# Patient Record
Sex: Male | Born: 1965 | Race: White | Hispanic: No | Marital: Married | Smoking: Never smoker
Health system: Southern US, Community
[De-identification: ages and names within clinical notes are randomized; demographics above are authoritative.]

## PROBLEM LIST (undated history)

## (undated) DIAGNOSIS — F32A Depression, unspecified: Secondary | ICD-10-CM

## (undated) DIAGNOSIS — F329 Major depressive disorder, single episode, unspecified: Secondary | ICD-10-CM

## (undated) DIAGNOSIS — G43909 Migraine, unspecified, not intractable, without status migrainosus: Secondary | ICD-10-CM

## (undated) DIAGNOSIS — E785 Hyperlipidemia, unspecified: Secondary | ICD-10-CM

## (undated) DIAGNOSIS — I2510 Atherosclerotic heart disease of native coronary artery without angina pectoris: Secondary | ICD-10-CM

## (undated) DIAGNOSIS — E237 Disorder of pituitary gland, unspecified: Secondary | ICD-10-CM

## (undated) DIAGNOSIS — Z9229 Personal history of other drug therapy: Secondary | ICD-10-CM

## (undated) DIAGNOSIS — E559 Vitamin D deficiency, unspecified: Secondary | ICD-10-CM

## (undated) DIAGNOSIS — R000 Tachycardia, unspecified: Secondary | ICD-10-CM

## (undated) DIAGNOSIS — I459 Conduction disorder, unspecified: Secondary | ICD-10-CM

## (undated) DIAGNOSIS — K219 Gastro-esophageal reflux disease without esophagitis: Secondary | ICD-10-CM

## (undated) DIAGNOSIS — Z7901 Long term (current) use of anticoagulants: Secondary | ICD-10-CM

## (undated) DIAGNOSIS — I425 Other restrictive cardiomyopathy: Secondary | ICD-10-CM

## (undated) DIAGNOSIS — D6859 Other primary thrombophilia: Secondary | ICD-10-CM

## (undated) DIAGNOSIS — E119 Type 2 diabetes mellitus without complications: Secondary | ICD-10-CM

## (undated) DIAGNOSIS — H6120 Impacted cerumen, unspecified ear: Secondary | ICD-10-CM

## (undated) DIAGNOSIS — Z950 Presence of cardiac pacemaker: Secondary | ICD-10-CM

## (undated) DIAGNOSIS — J309 Allergic rhinitis, unspecified: Secondary | ICD-10-CM

## (undated) DIAGNOSIS — C8190 Hodgkin lymphoma, unspecified, unspecified site: Secondary | ICD-10-CM

## (undated) HISTORY — DX: Major depressive disorder, single episode, unspecified: F32.9

## (undated) HISTORY — DX: Gastro-esophageal reflux disease without esophagitis: K21.9

## (undated) HISTORY — DX: Type 2 diabetes mellitus without complications: E11.9

## (undated) HISTORY — DX: Disorder of pituitary gland, unspecified: E23.7

## (undated) HISTORY — PX: NASAL SEPTUM SURGERY: SHX37

## (undated) HISTORY — DX: Atherosclerotic heart disease of native coronary artery without angina pectoris: I25.10

## (undated) HISTORY — DX: Migraine, unspecified, not intractable, without status migrainosus: G43.909

## (undated) HISTORY — DX: Tachycardia, unspecified: R00.0

## (undated) HISTORY — PX: ANGIOPLASTY: SHX39

## (undated) HISTORY — DX: Vitamin D deficiency, unspecified: E55.9

## (undated) HISTORY — PX: OTHER SURGICAL HISTORY: SHX169

## (undated) HISTORY — PX: CARDIAC SURGERY: SHX584

## (undated) HISTORY — DX: Hodgkin lymphoma, unspecified, unspecified site: C81.90

## (undated) HISTORY — PX: CORONARY ARTERY BYPASS GRAFT: SHX141

## (undated) HISTORY — DX: Long term (current) use of anticoagulants: Z79.01

## (undated) HISTORY — DX: Hyperlipidemia, unspecified: E78.5

## (undated) HISTORY — DX: Other primary thrombophilia: D68.59

## (undated) HISTORY — DX: Allergic rhinitis, unspecified: J30.9

## (undated) HISTORY — DX: Depression, unspecified: F32.A

## (undated) HISTORY — DX: Personal history of other drug therapy: Z92.29

## (undated) HISTORY — DX: Impacted cerumen, unspecified ear: H61.20

---
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
## 2009-10-11 HISTORY — PX: THORACOTOMY: SHX5074

## 2014-08-08 ENCOUNTER — Ambulatory Visit: Payer: Self-pay | Admitting: Cardiovascular Disease

## 2014-08-20 ENCOUNTER — Encounter: Payer: Self-pay | Admitting: Cardiovascular Disease

## 2014-08-20 ENCOUNTER — Ambulatory Visit (INDEPENDENT_AMBULATORY_CARE_PROVIDER_SITE_OTHER): Payer: 59 | Admitting: Cardiovascular Disease

## 2014-08-20 VITALS — BP 106/70 | HR 89 | Ht 70.0 in | Wt 198.1 lb

## 2014-08-20 DIAGNOSIS — Z954 Presence of other heart-valve replacement: Secondary | ICD-10-CM

## 2014-08-20 DIAGNOSIS — Z952 Presence of prosthetic heart valve: Secondary | ICD-10-CM | POA: Insufficient documentation

## 2014-08-20 NOTE — Progress Notes (Signed)
Mark Choi Date of Birth  [birth year]       Mark Choi    [employer] [address], [address], [place], [place]  [address]   [place], [place]  [address] [place]   Fax  [phone]     Fax [phone]  Problem List: 1. Status post aortic valve placement - 2010  2. Diabetes mellitus 3. Hyperlipidemia 4. Coronary artery disease - 1998 5. Hodgkin's disease - s/p mantal radiation, age 48.   6. Hx of "endocarditis" in 2014 - vegetation was never found   History of Present Illness:  Mark Choi is a new arrival from [place],  Has hx of AVR.  Recent graduate from [employer]) Had Hodgkins disease with mantel radiation,   The radiation caused numerous other problems - CAD, AS,  S/p St. Jude mechanical AV.  Coumadin levels have been poorly controlled.   INR 2.2 yesterday  Wants to come to our coumadin clinic He and his wife are remodeling their new kitchen so his diet has been poor -  eating out most day.   His blood pressure typically runs on the low side. His heart rate typically is 80 or 90 or perhaps higher. He has tried higher doses of metoprolol in the past but this caused hypotension.  He's feeling overall fairly well. He's here to establish cardiology care and he be established in the Coumadin clinic. He's not having any specific cardiac complaints today.   Current Outpatient Prescriptions  Medication Sig Dispense Refill  . aspirin 81 MG tablet Take 81 mg by mouth daily.    Marland Kitchen buPROPion (WELLBUTRIN SR) 150 MG 12 hr tablet Take 150 mg by mouth daily.    Marland Kitchen losartan (COZAAR) 25 MG tablet Take 25 mg by mouth daily.    . metFORMIN (GLUCOPHAGE) 1000 MG tablet Take 1,000 mg by mouth 2 (two) times daily with a meal.    . metoprolol tartrate (LOPRESSOR) 25 MG tablet Take 25 mg by mouth daily.    Marland Kitchen omeprazole (PRILOSEC) 20 MG capsule Take 20 mg by mouth daily.    . rosuvastatin (CRESTOR) 40 MG tablet Take  40 mg by mouth daily.    Marland Kitchen warfarin (COUMADIN) 6 MG tablet Take 6 mg by mouth daily.     No current facility-administered medications for this visit.     No Known Allergies  Past Medical History  Diagnosis Date  . Coronary artery disease   . Hodgkin's disease     AGE 84 IN REMISSION  . Hyperlipidemia   . MDD (major depressive disorder)     STABLE  . GERD (gastroesophageal reflux disease)   . Diabetes mellitus without complication     Past Surgical History  Procedure Laterality Date  . Coronary artery bypass graft    . Heart stent      3 STENTS  . Angioplasty      5  . Artificial aortic valve      ST JUDES    History  Smoking status  . Never Smoker   Smokeless tobacco  . Not on file    History  Alcohol Use  . Yes    Comment: SOCAILLY    Family History  Problem Relation Age of Onset  . Breast cancer Mother   . Alcohol abuse Mother   . Diabetes type II Father   . Diverticulitis Father   . Alcoholism Brother   .  Depression Brother     Reviw of Systems:  Reviewed in the HPI.  All other systems are negative.  Physical Exam: Blood pressure 106/70, pulse 89, height 5\' 10"  (1.778 m), weight 198 lb 1.9 oz (89.867 kg). Wt Readings from Last 3 Encounters:  08/20/14 198 lb 1.9 oz (89.867 kg)    General: Well developed, well nourished, in no acute distress.  Head: Normocephalic, atraumatic, sclera non-icteric, mucus membranes are moist,   Neck: Supple. Carotids are 2 + without bruits. No JVD   Lungs: Clear   Heart: RR, normal S1, mechanical S2  Abdomen: Soft, non-tender, non-distended with normal bowel sounds.  Msk:  Strength and tone are normal   Extremities: No clubbing or cyanosis. No edema.  Distal pedal pulses are 2+ and equal   Neuro: CN II - XII intact.  Alert and oriented X 3.   Psych:  Normal   ECG: Sinus rhythm at 89, 1st degree AV block   Assessment / Plan:

## 2014-08-20 NOTE — Assessment & Plan Note (Addendum)
He had an aortogram for placement in 2010 and. He is instructed of this aortic valve was presumably due to his mantle radiation for Hodgkins  disease. While he was being seen at the [facility] clinic on his goal INR was between 2.5 in 3.5. He is on ASA.  My reference in "Up to Date" shows his INR goal should be  between 2.0 - 3.0.  He wants to continue having his Coumadin checked by his medical doctor for now. He may be interested in being followed in our Coumadin clinic here.  He will call if he wants [place] to follow his INR levels followed here

## 2014-08-20 NOTE — Patient Instructions (Signed)
Your physician recommends that you continue on your current medications as directed. Please refer to the Current Medication list given to you today.  Your physician wants you to follow-up in: 6 months with Dr. Nahser.  You will receive a reminder letter in the mail two months in advance. If you don't receive a letter, please call our office to schedule the follow-up appointment.  

## 2014-09-07 ENCOUNTER — Emergency Department (HOSPITAL_COMMUNITY): Payer: 59

## 2014-09-07 ENCOUNTER — Emergency Department (HOSPITAL_COMMUNITY)
Admission: EM | Admit: 2014-09-07 | Discharge: 2014-09-08 | Disposition: A | Discharge status: Home / Self Care | Payer: 59 | Attending: Emergency Medicine | Admitting: Emergency Medicine

## 2014-09-07 ENCOUNTER — Encounter (HOSPITAL_COMMUNITY): Payer: Self-pay | Admitting: *Deleted

## 2014-09-07 DIAGNOSIS — R51 Headache: Secondary | ICD-10-CM | POA: Diagnosis present

## 2014-09-07 DIAGNOSIS — R112 Nausea with vomiting, unspecified: Secondary | ICD-10-CM | POA: Insufficient documentation

## 2014-09-07 DIAGNOSIS — R791 Abnormal coagulation profile: Secondary | ICD-10-CM | POA: Diagnosis not present

## 2014-09-07 DIAGNOSIS — R5381 Other malaise: Secondary | ICD-10-CM | POA: Diagnosis not present

## 2014-09-07 DIAGNOSIS — I2510 Atherosclerotic heart disease of native coronary artery without angina pectoris: Secondary | ICD-10-CM | POA: Insufficient documentation

## 2014-09-07 DIAGNOSIS — E119 Type 2 diabetes mellitus without complications: Secondary | ICD-10-CM | POA: Insufficient documentation

## 2014-09-07 DIAGNOSIS — R519 Headache, unspecified: Secondary | ICD-10-CM

## 2014-09-07 LAB — COMPREHENSIVE METABOLIC PANEL
ALBUMIN: 4.3 g/dL (ref 3.5–5.2)
ALK PHOS: 54 U/L (ref 39–117)
ALT: 30 U/L (ref 0–53)
AST: 30 U/L (ref 0–37)
Anion gap: 17 — ABNORMAL HIGH (ref 5–15)
BILIRUBIN TOTAL: 0.6 mg/dL (ref 0.3–1.2)
BUN: 14 mg/dL (ref 6–23)
CHLORIDE: 102 meq/L (ref 96–112)
CO2: 20 meq/L (ref 19–32)
CREATININE: 0.85 mg/dL (ref 0.50–1.35)
Calcium: 9 mg/dL (ref 8.4–10.5)
GFR calc Af Amer: >90 mL/min (ref >90)
GFR calc non Af Amer: >90 mL/min (ref >90)
GLUCOSE: 150 mg/dL — AB (ref 70–99)
POTASSIUM: 4.1 meq/L (ref 3.7–5.3)
Sodium: 139 mEq/L (ref 137–147)
Total Protein: 6.8 g/dL (ref 6.0–8.3)

## 2014-09-07 LAB — DIFFERENTIAL
BASOS PCT: 0 % (ref 0–1)
Basophils Absolute: 0 10*3/uL (ref 0.0–0.1)
Eosinophils Absolute: 0.2 10*3/uL (ref 0.0–0.7)
Eosinophils Relative: 2 % (ref 0–5)
LYMPHS ABS: 1.7 10*3/uL (ref 0.7–4.0)
Lymphocytes Relative: 17 % (ref 12–46)
MONOS PCT: 5 % (ref 3–12)
Monocytes Absolute: 0.5 10*3/uL (ref 0.1–1.0)
NEUTROS ABS: 7.6 10*3/uL (ref 1.7–7.7)
Neutrophils Relative %: 76 % (ref 43–77)

## 2014-09-07 LAB — TROPONIN I: Troponin I: <0.3 ng/mL (ref <0.30)

## 2014-09-07 LAB — CBC
HEMATOCRIT: 40.3 % (ref 39.0–52.0)
Hemoglobin: 13.7 g/dL (ref 13.0–17.0)
MCH: 29 pg (ref 26.0–34.0)
MCHC: 34 g/dL (ref 30.0–36.0)
MCV: 85.4 fL (ref 78.0–100.0)
PLATELETS: 176 10*3/uL (ref 150–400)
RBC: 4.72 MIL/uL (ref 4.22–5.81)
RDW: 13.5 % (ref 11.5–15.5)
WBC: 10 10*3/uL (ref 4.0–10.5)

## 2014-09-07 LAB — PROTIME-INR
INR: 3.41 — ABNORMAL HIGH (ref 0.00–1.49)
PROTHROMBIN TIME: 34.7 s — AB (ref 11.6–15.2)

## 2014-09-07 LAB — APTT: aPTT: 40 seconds — ABNORMAL HIGH (ref 24–37)

## 2014-09-07 LAB — CBG MONITORING, ED: Glucose-Capillary: 130 mg/dL — ABNORMAL HIGH (ref 70–99)

## 2014-09-07 MED ORDER — ONDANSETRON HCL 4 MG/2ML IJ SOLN
4.0000 mg | Freq: Once | INTRAMUSCULAR | Status: AC
  Administered 2014-09-07: 4 mg via INTRAVENOUS
  Filled 2014-09-07: qty 2

## 2014-09-07 MED ORDER — HYDROMORPHONE HCL 1 MG/ML IJ SOLN
1.0000 mg | Freq: Once | INTRAMUSCULAR | Status: AC
  Administered 2014-09-07: 1 mg via INTRAVENOUS
  Filled 2014-09-07: qty 1

## 2014-09-07 MED ORDER — ONDANSETRON HCL 4 MG/2ML IJ SOLN: 4.0000 mg | Freq: Once | INTRAMUSCULAR | Status: DC

## 2014-09-07 MED ORDER — PROCHLORPERAZINE MALEATE 10 MG PO TABS
10.0000 mg | ORAL_TABLET | Freq: Two times a day (BID) | ORAL | Status: DC | PRN
Start: 2014-09-07 — End: 2017-01-28

## 2014-09-07 NOTE — ED Provider Notes (Signed)
CSN: [id number]     Arrival date & time 09/07/14  1932 History   First MD Initiated Contact with Patient 09/07/14 1958     Chief Complaint  Patient presents with  . Headache     (Consider location/radiation/quality/duration/timing/severity/associated sxs/prior Treatment) HPI Comments: Patient is a 48 year old male with history of Hodgkin's disease, hyperlipidemia, depression, GERD, diabetes, CAD, artifical aortic valve who presents to the ED for evaluation of headache. He reports that the headache began around noon, but was mild. He took a tylenol with relief of his symptoms. Suddenly at [facility] while he was at dinner he developed a severe headache with nausea and vomiting. He denies blurry vision, double vision, photophobia. No numbness or weakness in his extremities. Patient is on Coumadin for his artificial heart valve and recently had a supra therapeutic INR of 4.1.    The history is provided by the patient. No language interpreter was used.    Past Medical History  Diagnosis Date  . Diabetes mellitus without complication   . Coronary artery disease    Past Surgical History  Procedure Laterality Date  . Aortic heart valve    . Cardiac surgery    . Cardiac stents     No family history on file. History  Substance Use Topics  . Smoking status: Never Smoker   . Smokeless tobacco: Not on file  . Alcohol Use: Yes     Comment: occ    Review of Systems  Constitutional: Negative for fever and chills.  Eyes: Negative for photophobia and visual disturbance.  Respiratory: Negative for shortness of breath.   Cardiovascular: Negative for chest pain.  Gastrointestinal: Positive for nausea and vomiting. Negative for abdominal pain.  Neurological: Positive for headaches. Negative for weakness and numbness.  All other systems reviewed and are negative.     Allergies  Review of patient's allergies indicates no known allergies.  Home Medications   Prior to Admission medications    Not on File   BP 136/90 mmHg  Pulse 101  Temp(Src) 98.3 F (36.8 C) (Oral)  Resp 16  SpO2 97% Physical Exam  Constitutional: He is oriented to person, place, and time. He appears well-developed and well-nourished. No distress.  HENT:  Head: Normocephalic and atraumatic.  Right Ear: External ear normal.  Left Ear: External ear normal.  Nose: Nose normal.  Eyes: Conjunctivae and EOM are normal. Pupils are equal, round, and reactive to light.  Neck: Normal range of motion. No rigidity. No tracheal deviation present.  No nuchal rigidity   Cardiovascular: Regular rhythm, intact distal pulses and normal pulses.  Tachycardia present.   Pulses:      Radial pulses are 2+ on the right side, and 2+ on the left side.       Posterior tibial pulses are 2+ on the right side, and 2+ on the left side.  Clicking of aortic valve replacement on auscultation   Pulmonary/Chest: Effort normal and breath sounds normal. No stridor.  Abdominal: Soft. He exhibits no distension. There is no tenderness.  Musculoskeletal: Normal range of motion.  Neurological: He is alert and oriented to person, place, and time. He has normal strength. No sensory deficit. Coordination and gait normal. GCS eye subscore is 4. GCS verbal subscore is 5. GCS motor subscore is 6.  Finger nose finger normal, rapid alternating movements normal, heel knee shin normal. Grip strength 5/5 bilaterally No pronator drift No facial droop Strength 5/5 in all extremities  Skin: Skin is warm and dry.  He is not diaphoretic.  Psychiatric: He has a normal mood and affect. His behavior is normal.  Nursing note and vitals reviewed.   ED Course  Procedures (including critical care time) Labs Review Labs Reviewed  PROTIME-INR - Abnormal; Notable for the following:    Prothrombin Time 34.7 (*)    INR 3.41 (*)    All other components within normal limits  APTT - Abnormal; Notable for the following:    aPTT 40 (*)    All other components  within normal limits  COMPREHENSIVE METABOLIC PANEL - Abnormal; Notable for the following:    Glucose, Bld 150 (*)    Anion gap 17 (*)    All other components within normal limits  CBG MONITORING, ED - Abnormal; Notable for the following:    Glucose-Capillary 130 (*)    All other components within normal limits  CBC  DIFFERENTIAL  TROPONIN I    Imaging Review Dg Chest 2 View  09/07/2014   CLINICAL DATA:  Acute onset of headache and vomiting, with malaise. Initial encounter.  EXAM: CHEST  2 VIEW  COMPARISON:  None.  FINDINGS: The lungs are well-aerated. Linear atelectasis or scarring is noted at the right midlung zone, with mild right-sided pleural thickening. Overlying postoperative change is noted near the right lung apex. There is no evidence of significant the pleural effusion or pneumothorax.  The heart is normal in size; the patient is status post median sternotomy. No acute osseous abnormalities are seen.  IMPRESSION: Linear atelectasis or scarring at the right mid lung zone, with mild right-sided pleural thickening, likely chronic in nature. Lungs otherwise clear.   Electronically Signed   By: Garald Balding M.D.   On: 09/07/2014 23:43   Ct Head (brain) Wo Contrast  09/07/2014   CLINICAL DATA:  48 year old male with progressive frontal headache and developing dizziness. Patient is on blood thinners.  EXAM: CT HEAD WITHOUT CONTRAST  TECHNIQUE: Contiguous axial images were obtained from the base of the skull through the vertex without intravenous contrast.  COMPARISON:  None.  FINDINGS: Negative for acute intracranial hemorrhage, acute infarction, mass, mass effect, hydrocephalus or midline shift. Gray-white differentiation is preserved throughout. No acute soft tissue or calvarial abnormality. The globes and orbits are symmetric and unremarkable. Normal aeration of the mastoid air cells. Mucous retention cyst versus polyp in the inferior right maxillary sinus.  IMPRESSION: Negative head CT.   Incidental note made of a mucous retention cyst versus polyp in the inferior right maxillary sinus.   Electronically Signed   By: Jacqulynn Cadet M.D.   On: 09/07/2014 20:46     EKG Interpretation   Date/Time:  Saturday September 07 2014 23:01:25 EST Ventricular Rate:  92 PR Interval:  215 QRS Duration: 94 QT Interval:  369 QTC Calculation: 456 R Axis:   19 Text Interpretation:  Age not entered, assumed to be  48 years old for  purpose of ECG interpretation Sinus rhythm Prolonged PR interval Probable  left atrial enlargement RSR' in V1 or V2, right VCD or RVH Probable left  ventricular hypertrophy Inferior infarct, old No old tracing to compare  Confirmed by Debby Freiberg [phone]) on 09/07/2014 11:05:08 PM      MDM   Final diagnoses:  Malaise  Acute nonintractable headache, unspecified headache type  Supratherapeutic INR    Patient presents to ED for evaluation of headache. Mild headache early today, but with sudden worsening at [facility]. CT head done less than 6 hours after onset is negative for  acute pathology. Given sensitivity of CT within 6 hours, it is unlikely patient has acute bleed missed on CT. Patient with supra therapeutic INR and risks of LP outweigh benefits. Patient feels significantly improved in ED after IV dilaudid and zofran. Discussed strict return instructions with patient who voices understanding. Patient is reliable for follow up and vital signs are stable for discharge. Dr. Colin Rhein evaluated patient and agrees with plan. Patient / Family / Caregiver informed of clinical course, understand medical decision-making process, and agree with plan.   Elwyn Lade, PA-C 09/09/14 1728  Debby Freiberg, MD 09/14/14 [phone]

## 2014-09-07 NOTE — Discharge Instructions (Signed)
Please return to the emergency department if you have new or worsening symptoms.   General Headache Without Cause A headache is pain or discomfort felt around the head or neck area. The specific cause of a headache may not be found. There are many causes and types of headaches. A few common ones are:  Tension headaches.  Migraine headaches.  Cluster headaches.  Chronic daily headaches. HOME CARE INSTRUCTIONS   Keep all follow-up appointments with your caregiver or any specialist referral.  Only take over-the-counter or prescription medicines for pain or discomfort as directed by your caregiver.  Lie down in a dark, quiet room when you have a headache.  Keep a headache journal to find out what may trigger your migraine headaches. For example, write down:  What you eat and drink.  How much sleep you get.  Any change to your diet or medicines.  Try massage or other relaxation techniques.  Put ice packs or heat on the head and neck. Use these 3 to 4 times per day for 15 to 20 minutes each time, or as needed.  Limit stress.  Sit up straight, and do not tense your muscles.  Quit smoking if you smoke.  Limit alcohol use.  Decrease the amount of caffeine you drink, or stop drinking caffeine.  Eat and sleep on a regular schedule.  Get 7 to 9 hours of sleep, or as recommended by your caregiver.  Keep lights dim if bright lights bother you and make your headaches worse. SEEK MEDICAL CARE IF:   You have problems with the medicines you were prescribed.  Your medicines are not working.  You have a change from the usual headache.  You have nausea or vomiting. SEEK IMMEDIATE MEDICAL CARE IF:   Your headache becomes severe.  You have a fever.  You have a stiff neck.  You have loss of vision.  You have muscular weakness or loss of muscle control.  You start losing your balance or have trouble walking.  You feel faint or pass out.  You have severe symptoms that  are different from your first symptoms. MAKE SURE YOU:   Understand these instructions.  Will watch your condition.  Will get help right away if you are not doing well or get worse. Document Released: 09/27/2005 Document Revised: 12/20/2011 Document Reviewed: 10/13/2011 [facility] Patient Information 2015 [place], [place]. This information is not intended to replace advice given to you by your health care provider. Make sure you discuss any questions you have with your health care provider.  Warfarin Coagulopathy Warfarin (Coumadin) coagulopathy refers to bleeding that may occur as a complication of the medicine warfarin. Warfarin is an oral blood thinner (anticoagulant). Warfarin is used for medical conditions where thinning of the blood is needed to prevent blood clots.  CAUSES Bleeding is the most common and most serious complication of warfarin. The amount of bleeding is related to the warfarin dose and length of treatment. In addition, bleeding complications can also occur due to:  Intentional or accidental warfarin overdose.  Underlying medical conditions.  Dietary changes.  Medicine, herbal, supplement, or alcohol interactions. SYMPTOMS Severe bleeding while on warfarin may occur from any tissue or organ. Symptoms of the blood being too thin may include:  Bleeding from the nose or gums.  Blood in bowel movements which may appear as bright red, dark, or black tarry stools.  Blood in the urine which may appear as pink, red, or brown urine.  Unusual bruising or bruising easily.  A  cut that does not stop bleeding within 10 minutes.  Vomiting blood or continuous nausea for more than 1 day.  Coughing up blood.  Broken blood vessels in your eye (subconjunctival hemorrhage).  Abdominal or back pain with or without flank bruising.  Sudden, severe headache.  Sudden weakness or numbness of the face, arm, or leg, especially on one side of the body.  Sudden  confusion.  Trouble speaking (aphasia) or understanding.  Sudden trouble seeing in one or both eyes.  Sudden trouble walking.  Dizziness.  Loss of balance or coordination.  Vaginal bleeding.  Swelling or pain at an injection site.  Superficial fat tissue death (necrosis) which may cause skin scarring. This is more common in women and may first present as pain in the waist, thighs, or buttocks.  Fever. HOME CARE INSTRUCTIONS  Always contact your health care provider of any concerns or signs of possible warfarin coagulopathy as soon as possible.  Take warfarin exactly as directed by your health care provider. It is recommended that you take your warfarin dose at the same time of the day. If you have been told to stop taking warfarin, do not resume taking warfarin until directed to do so by your health care provider. Follow your health care provider's instructions if you accidentally take an extra dose or miss a dose of warfarin. It is very important to take warfarin as directed since bleeding or blood clots could result in chronic or permanent injury, pain, or disability.  Keep all follow-up appointments with your health care provider as directed. It is very important to keep your appointments. Not keeping appointments could result in a chronic or permanent injury, pain, or disability because warfarin is a medicine that requires close monitoring.  While taking warfarin, you will need to have regular blood tests to measure your blood clotting time. These blood tests usually include both the prothrombin time (PT) and International Normalized Ratio (INR) tests. The PT and INR results allow your health care provider to adjust your dose of warfarin. The dose can change for many reasons. It is critically important that you have your PT and INR levels drawn exactly as directed. Your warfarin dose may stay the same or change depending on what the PT and INR results are. Be sure to follow up with  your health care provider regarding your PT and INR test results and what your warfarin dosage should be.  Many medicines can interfere with warfarin and affect the PT and INR results. You must tell your health care provider about any and all medicines you take. This includes all vitamins and supplements. Ask your health care provider before taking these. Prescription and over-the-counter medicine consistency is critical to warfarin management. It is important that potential interactions are checked before you start a new medicine. Be especially cautious with aspirin and anti-inflammatory medicines. Ask your health care provider before taking these. Medicines such as antibiotics and acid-reducing medicine can interact with warfarin and can cause an increased warfarin effect. Warfarin can also interfere with the effectiveness of medicines you are taking. Do not take or discontinue any prescribed or over-the-counter medicine except on the advice of your health care provider or pharmacist.  Some vitamins, supplements, and herbal products interfere with the effectiveness of warfarin. Vitamin E may increase the anticoagulant effects of warfarin. Vitamin K can cause warfarin to be less effective. Do not take or discontinue any vitamin, supplement, or herbal product except on the advice of your health care provider or pharmacist.  Eat what you normally eat and keep the vitamin K content of your diet consistent. Avoid major changes in your diet, or notify your health care provider before changing your diet. Suddenly getting a lot more vitamin K could cause your blood to clot too quickly. A sudden decrease in vitamin K intake could cause your blood to clot too slowly. These changes in vitamin K intake could lead to dangerous blood clotsor to bleeding. To keep your vitamin K intake consistent, you must be aware of which foods contain moderate or high amounts of vitamin K. Some foods high in vitamin K include spinach,  kale, broccoli, cabbage, greens, Brussels sprouts, asparagus, bok choy, coleslaw, parsley, and green tea. Arrange a visit with a dietitian to answer your questions.  If you have a loss of appetite or get the stomach flu (viral gastroenteritis), talk to your health care provider as soon as possible. A decrease in your normal vitamin K intake can make you more sensitive to your usual dose of warfarin.  Some medical conditions may increase your risk for bleeding while you are taking warfarin. A fever, diarrhea lasting more than a day, worsening heart failure, or worsening liver function are some medical conditions that could affect warfarin. Contact your health care provider if you have any of these medical conditions.  Be careful not to cut yourself when using sharp objects or while shaving.  Alcohol can change the body's ability to handle warfarin. It is best to avoid alcoholic drinks or consume only very small amounts while taking warfarin. Notify your health care provider if you change your alcohol intake. A sudden increase in alcohol use can increase your risk of bleeding. Chronic alcohol use can cause warfarin to be less effective.  Limit physical activities or sports that could result in a fall or cause injury.  Do not use warfarin if you are pregnant.  Inform all your health care providers and your dentist that you take warfarin.  Inform all health care providers if you are taking warfarin and aspirin or platelet inhibitor medicines such as clopidogrel, ticagrelor, or prasugrel. Use of these medicines in addition to warfarin can increase your risk of bleeding or death. Taking these medicines together should only be done under the direct care of your health care providers. SEEK IMMEDIATE MEDICAL CARE IF:  You cough up blood.  You have dark or black stools or there is bright red blood coming from your rectum.  You vomit blood or have nausea for more than 1 day.  You have blood in the  urine or pink-colored urine.  You have unusual bruising or have increased bruising.  You have bleeding from the nose or gums that does not stop quickly.  You have a cut that does not stop bleeding within 2-3 minutes.  You have sudden weakness or numbness of the face, arm, or leg, especially on one side of the body.  You have sudden confusion.  You have trouble speaking (aphasia) or understanding.  You have sudden trouble seeing in one or both eyes.  You have sudden trouble walking.  You have dizziness.  You have a loss of balance or coordination.  You have a sudden, severe headache.  You have a serious fall or head injury, even if you are not bleeding.  You have swelling or pain at an injection site.  You have unexplained tenderness or pain in the abdomen, back, waist, thighs, or buttocks.  You have a fever. Any of these symptoms may represent a  serious problem that is an emergency. Do not wait to see if the symptoms will go away. Get medical help right away. Call your local emergency services (911 in [place] Do not drive yourself to the hospital. Document Released: 09/05/2006 Document Revised: 02/11/2014 Document Reviewed: 03/07/2012 [facility] Patient Information 2015 [place], [place]. This information is not intended to replace advice given to you by your health care provider. Make sure you discuss any questions you have with your health care provider.

## 2014-09-07 NOTE — ED Notes (Signed)
Pt states started getting headache earlier today.  Pt states now it is all over and feels bad.  Ate and started vomiting.  Pt is on blood thinner for artificial heart valve and concerned for brain bleed.

## 2014-09-09 ENCOUNTER — Encounter: Payer: Self-pay | Admitting: Cardiovascular Disease

## 2014-11-15 ENCOUNTER — Encounter: Payer: Self-pay | Admitting: Cardiovascular Disease

## 2014-11-15 ENCOUNTER — Ambulatory Visit (INDEPENDENT_AMBULATORY_CARE_PROVIDER_SITE_OTHER): Payer: 59 | Admitting: Cardiovascular Disease

## 2014-11-15 VITALS — BP 122/76 | HR 97 | Ht 70.0 in | Wt 196.8 lb

## 2014-11-15 DIAGNOSIS — Z954 Presence of other heart-valve replacement: Secondary | ICD-10-CM

## 2014-11-15 NOTE — Progress Notes (Signed)
Cardiology Office Note   Date:  11/15/2014   ID:  Mark Choi, DOB 1966-06-26, MRN [id number]  PCP:  No primary care provider on file.  Cardiologist:   Nahser, Wonda Cheng, MD   Chief Complaint  Patient presents with  . Follow-up    aortic valve replacement   Problem List: 1. Status post aortic valve placement - 2010  2. Diabetes mellitus 3. Hyperlipidemia 4. Coronary artery disease -  CABG in 1998 , multiple stents following CABG  5. Hodgkin's disease - s/p mantal radiation, age 76.  6. 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 male who presents for follow up for his AVR.   Since I saw him several months ago, he has developed symptoms  He has had dyspnea.  With any sort of exertion.  Associated with lightheadedness.  Resolves after several months.  Has occasional dyspnea even with sitting   Not related to a URI that he had a week ago. Also has some tingling in his arms with exertion. Rare cp Has been going on for 1 1/2 months.  Progressive worsening.  These symptoms feel similar to his symptoms that he had prior to his CABG.      Past Medical History  Diagnosis Date  . Hodgkin's disease     AGE 70 IN REMISSION  . Hyperlipidemia   . MDD (major depressive disorder)     STABLE  . GERD (gastroesophageal reflux disease)   . Diabetes mellitus without complication   . Coronary artery disease     Past Surgical History  Procedure Laterality Date  . Coronary artery bypass graft    . Heart stent      3 STENTS  . Angioplasty      5  . Artificial aortic valve      [facility]  . Aortic heart valve    . Cardiac surgery    . Cardiac stents       Current Outpatient Prescriptions  Medication Sig Dispense Refill  . aspirin 81 MG tablet Take 81 mg by mouth daily.    Marland Kitchen buPROPion (WELLBUTRIN SR) 150 MG 12 hr tablet Take 150 mg by mouth daily.    Marland Kitchen losartan (COZAAR) 25 MG tablet Take 25 mg by mouth  daily.    . metFORMIN (GLUCOPHAGE) 1000 MG tablet Take 1,000 mg by mouth 2 (two) times daily with a meal.    . metoprolol tartrate (LOPRESSOR) 25 MG tablet Take 25 mg by mouth every evening.    Marland Kitchen omeprazole (PRILOSEC) 20 MG capsule Take 20 mg by mouth daily.    . prochlorperazine (COMPAZINE) 10 MG tablet Take 1 tablet (10 mg total) by mouth 2 (two) times daily as needed for nausea or vomiting (Nausea ). 10 tablet 0  . ranitidine (ZANTAC) 150 MG capsule Take 150 mg by mouth daily as needed for heartburn.    . rosuvastatin (CRESTOR) 40 MG tablet Take 40 mg by mouth daily.    . Testosterone 12.5 MG/ACT (1%) GEL Apply 12.5 mg topically daily.  1  . warfarin (COUMADIN) 4 MG tablet Take 4 mg by mouth daily. Monday Wednesday Friday patient takes 4 mg     No current facility-administered medications for this visit.    Allergies:   Review of patient's allergies indicates no known allergies.    Social History:  The patient  reports that he has never smoked. He does not have  any smokeless tobacco history on file. He reports that he drinks alcohol. He reports that he does not use illicit drugs.   Family History:  The patient's family history includes Alcohol abuse in his mother; Alcoholism in his brother; Breast cancer in his mother; Depression in his brother; Diabetes type II in his father; Diverticulitis in his father.    ROS:  Please see the history of present illness.    Review of Systems: Constitutional:  denies fever, chills, diaphoresis, appetite change and fatigue.  HEENT: denies photophobia, eye pain, redness, hearing loss, ear pain, congestion, sore throat, rhinorrhea, sneezing, neck pain, neck stiffness and tinnitus.  Respiratory: admits to SOB, DOE,  Cardiovascular: denies chest pain, palpitations and leg swelling.  Gastrointestinal: denies nausea, vomiting, abdominal pain, diarrhea, constipation, blood in stool.  Genitourinary: denies dysuria, urgency, frequency, hematuria, flank pain  and difficulty urinating.  Musculoskeletal: denies  myalgias, back pain, joint swelling, arthralgias and gait problem.   Skin: denies pallor, rash and wound.  Neurological: denies dizziness, seizures, syncope, weakness, light-headedness, numbness and headaches.   Hematological: denies adenopathy, easy bruising, personal or family bleeding history.  Psychiatric/ Behavioral: denies suicidal ideation, mood changes, confusion, nervousness, sleep disturbance and agitation.       All other systems are reviewed and negative.    PHYSICAL EXAM: VS:  BP 122/76 mmHg  Pulse 97  Ht 5\' 10"  (1.778 m)  Wt 196 lb 12.8 oz (89.268 kg)  BMI 28.24 kg/m2 , BMI Body mass index is 28.24 kg/(m^2). GEN: Well nourished, well developed, in no acute distress HEENT: normal Neck: no JVD, carotid bruits, or masses Cardiac: RRR; no murmurs, rubs, or gallops,no edema  Respiratory:  clear to auscultation bilaterally, normal work of breathing GI: soft, nontender, nondistended, + BS MS: no deformity or atrophy Skin: warm and dry, no rash Neuro:  Strength and sensation are intact Psych: normal   EKG:  EKG is ordered today. The ekg ordered today demonstrates NSR at 97,  1st degree AV block . Left posterior fascicular block    Recent Labs: 09/07/2014: ALT 30; BUN 14; Creatinine 0.85; Hemoglobin 13.7; Platelets 176; Potassium 4.1; Sodium 139    Lipid Panel No results found for: CHOL, TRIG, HDL, CHOLHDL, VLDL, LDLCALC, LDLDIRECT    Wt Readings from Last 3 Encounters:  11/15/14 196 lb 12.8 oz (89.268 kg)  08/20/14 198 lb 1.9 oz (89.867 kg)      Other studies Reviewed: Additional studies/ records that were reviewed today include: . Review of the above records demonstrates:    ASSESSMENT AND PLAN:  Problem List: 1. Status post aortic valve placement - 2010  - we'll get an echocardiogram to get up good idea of what his current function and valvular function is. 2. Diabetes mellitus - followed by his  general medical doctor 3. Hyperlipidemia 4. Coronary artery disease -  CABG 1998, [place] has had multiple stent procedures following his bypass surgery. The symptoms are very similar to his previous episodes of angina. We'll get a [employer] study. I will have her very low threshold to perform a cardiac catheterization if his symptoms continue. 5. Hodgkin's disease - s/p mantal radiation, age 81.  6. 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  The patient does not have concerns regarding medicines.  The following changes have been made:  no change   Disposition:   FU with me in 1 months  Signed, Nahser, Wonda Cheng, MD  11/15/2014 10:05 AM    [facility] [address], [place], [place]  [address] Phone: [phone]; Fax: [phone]

## 2014-11-15 NOTE — Patient Instructions (Signed)
Your physician recommends that you continue on your current medications as directed. Please refer to the Current Medication list given to you today.  Your physician has requested that you have an echocardiogram. Echocardiography is a painless test that uses sound waves to create images of your heart. It provides your doctor with information about the size and shape of your heart and how well your heart's chambers and valves are working. This procedure takes approximately one hour. There are no restrictions for this procedure.  Your physician has requested that you have a lexiscan myoview. For further information please visit [email or web address]. Please follow instruction sheet, as given.  Your physician recommends that you schedule a follow-up appointment in: 1 month with Dr. Acie Fredrickson.

## 2014-11-26 ENCOUNTER — Ambulatory Visit (HOSPITAL_COMMUNITY): Payer: 59 | Attending: Cardiology

## 2014-11-26 ENCOUNTER — Ambulatory Visit (HOSPITAL_COMMUNITY): Payer: 59

## 2014-11-26 ENCOUNTER — Other Ambulatory Visit (HOSPITAL_COMMUNITY): Payer: 59 | Admitting: Cardiology

## 2014-11-26 DIAGNOSIS — E119 Type 2 diabetes mellitus without complications: Secondary | ICD-10-CM | POA: Insufficient documentation

## 2014-11-26 DIAGNOSIS — Z952 Presence of prosthetic heart valve: Secondary | ICD-10-CM

## 2014-11-26 DIAGNOSIS — Z954 Presence of other heart-valve replacement: Secondary | ICD-10-CM | POA: Diagnosis not present

## 2014-11-26 DIAGNOSIS — E785 Hyperlipidemia, unspecified: Secondary | ICD-10-CM | POA: Insufficient documentation

## 2014-11-26 MED ORDER — PERFLUTREN LIPID MICROSPHERE
1.0000 mL | Freq: Once | INTRAVENOUS | Status: AC
  Administered 2014-11-26: 1 mL via INTRAVENOUS

## 2014-11-26 NOTE — Progress Notes (Signed)
Cardiology Office Note   Date:  11/27/2014   ID:  Mark Choi, DOB Sep 28, 1966, MRN [id number]  PCP:  Rachell Cipro, MD  Cardiologist:   Thayer Headings, MD   Chief Complaint  Patient presents with  . Follow-up    endocarditis   Problem List: 1. Status post aortic valve placement - 2010  2. Diabetes mellitus 3. Hyperlipidemia 4. Coronary artery disease -  CABG in 1998 , multiple stents following CABG  5. Hodgkin's disease - s/p mantal radiation, age 49.  6. 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 male who presents for follow up for his AVR.   Since I saw him several months ago, he has developed symptoms  He has had dyspnea.  With any sort of exertion.  Associated with lightheadedness.  Resolves after several months.  Has occasional dyspnea even with sitting   Not related to a URI that he had a week ago. Also has some tingling in his arms with exertion. Rare cp Has been going on for 1 1/2 months.  Progressive worsening.  These symptoms feel similar to his symptoms that he had prior to his CABG.    Feb. 17, 2016:  Mark Choi had his echo yesterday and was  Found to have a vegetation on the aortic valve.  The vegetation is a filamentous strand like growth.  He has known endocarditis in 2014 and was treated at another hospital.  He has not had any symptoms of endocarditis.    Mark Choi is doing very well.  Not sick in any way. No fevers, rash,  No blood in urine  Has had migraine headaches.  Doubt that's related.    Past Medical History  Diagnosis Date  . Hodgkin's disease     AGE 59 IN REMISSION  . Hyperlipidemia   . MDD (major depressive disorder)     STABLE  . GERD (gastroesophageal reflux disease)   . Diabetes mellitus without complication   . Coronary artery disease     Past Surgical History  Procedure Laterality Date  . Coronary artery bypass graft    . Heart stent      3 STENTS    . Angioplasty      5  . Artificial aortic valve      [facility]  . Aortic heart valve    . Cardiac surgery    . Cardiac stents       Current Outpatient Prescriptions  Medication Sig Dispense Refill  . aspirin 81 MG tablet Take 81 mg by mouth daily.    Marland Kitchen buPROPion (WELLBUTRIN SR) 150 MG 12 hr tablet Take 150 mg by mouth daily.    Marland Kitchen losartan (COZAAR) 25 MG tablet Take 25 mg by mouth daily.    . metFORMIN (GLUCOPHAGE) 1000 MG tablet Take 1,000 mg by mouth 2 (two) times daily with a meal.    . metoprolol tartrate (LOPRESSOR) 25 MG tablet Take 25 mg by mouth every evening.    Marland Kitchen omeprazole (PRILOSEC) 20 MG capsule Take 20 mg by mouth daily.    . prochlorperazine (COMPAZINE) 10 MG tablet Take 1 tablet (10 mg total) by mouth 2 (two) times daily as needed for nausea or vomiting (Nausea ). 10 tablet 0  . ranitidine (ZANTAC) 150 MG capsule Take 150 mg by mouth daily as needed for heartburn.    . rosuvastatin (CRESTOR) 40 MG tablet Take 40 mg by mouth daily.    Marland Kitchen  Testosterone 12.5 MG/ACT (1%) GEL Apply 12.5 mg topically daily.  1  . warfarin (COUMADIN) 4 MG tablet Take 4 mg by mouth daily. Monday Wednesday Friday patient takes 4 mg     No current facility-administered medications for this visit.    Allergies:   Review of patient's allergies indicates no known allergies.    Social History:  The patient  reports that he has never smoked. He does not have any smokeless tobacco history on file. He reports that he drinks alcohol. He reports that he does not use illicit drugs.   Family History:  The patient's family history includes Alcohol abuse in his mother; Alcoholism in his brother; Breast cancer in his mother; Depression in his brother; Diabetes type II in his father; Diverticulitis in his father.    ROS:  Please see the history of present illness.    Review of Systems: Constitutional:  denies fever, chills, diaphoresis, appetite change and fatigue.  HEENT: denies photophobia, eye pain,  redness, hearing loss, ear pain, congestion, sore throat, rhinorrhea, sneezing, neck pain, neck stiffness and tinnitus.  Respiratory: admits to SOB, DOE,  Cardiovascular: denies chest pain, palpitations and leg swelling.  Gastrointestinal: denies nausea, vomiting, abdominal pain, diarrhea, constipation, blood in stool.  Genitourinary: denies dysuria, urgency, frequency, hematuria, flank pain and difficulty urinating.  Musculoskeletal: denies  myalgias, back pain, joint swelling, arthralgias and gait problem.   Skin: denies pallor, rash and wound.  Neurological: denies dizziness, seizures, syncope, weakness, light-headedness, numbness and headaches.   Hematological: denies adenopathy, easy bruising, personal or family bleeding history.  Psychiatric/ Behavioral: denies suicidal ideation, mood changes, confusion, nervousness, sleep disturbance and agitation.       All other systems are reviewed and negative.    PHYSICAL EXAM: VS:  BP 102/60 mmHg  Pulse 89  Ht 5\' 11"  (1.803 m)  Wt 198 lb (89.812 kg)  BMI 27.63 kg/m2 , BMI Body mass index is 27.63 kg/(m^2). GEN: Well nourished, well developed, in no acute distress HEENT: normal Neck: no JVD, carotid bruits, or masses Cardiac: RRR; no murmurs, rubs, or gallops,no edema  Respiratory:  clear to auscultation bilaterally, normal work of breathing GI: soft, nontender, nondistended, + BS MS: no deformity or atrophy Skin: warm and dry, no rash Neuro:  Strength and sensation are intact Psych: normal   EKG:  EKG is ordered today. The ekg ordered today demonstrates NSR at 97,  1st degree AV block . Left posterior fascicular block    Recent Labs: 09/07/2014: ALT 30; BUN 14; Creatinine 0.85; Hemoglobin 13.7; Platelets 176; Potassium 4.1; Sodium 139    Lipid Panel No results found for: CHOL, TRIG, HDL, CHOLHDL, VLDL, LDLCALC, LDLDIRECT    Wt Readings from Last 3 Encounters:  11/27/14 198 lb (89.812 kg)  11/15/14 196 lb 12.8 oz (89.268  kg)  08/20/14 198 lb 1.9 oz (89.867 kg)      Other studies Reviewed: Additional studies/ records that were reviewed today include: . Review of the above records demonstrates:    ASSESSMENT AND PLAN:  Problem List: 1. Status post aortic valve placement - 2010  - his echocardiogram from yesterday shows a filamentous structure attached to the aortic valve. I suspect that this is the vegetation from his episode of endocarditis in 2014.  He's not having any symptoms of endocarditis. We'll get some labs to make sure that this is a sterile structure. 2. Diabetes mellitus - followed by his general medical doctor 3. Hyperlipidemia 4. Coronary artery disease -  CABG 1998,  Mark Choi has had multiple stent procedures following his bypass surgery. The symptoms are very similar to his previous episodes of angina. We'll get a [employer] study. I will have her very low threshold to perform a cardiac catheterization if his symptoms continue. 5. Hodgkin's disease - s/p mantal radiation, age 42.  6. Hx of endocarditis in 2014 -  his echo card gram yesterday showed a filamentous structure attached to the aortic valve. He's not having any symptoms of endocarditis and I suspect that this is sterile. This is probably the vegetation leftover from his episode of endocarditis in 2014. He  Will draw labs today including blood cultures 2, CBC, sedimentation rate, basic medical profile   Current medicines are reviewed at length with the patient today.  The patient does not have concerns regarding medicines.  The following changes have been made:  no change   Disposition:   FU with me in 1 month     Signed, Nehemias Sauceda, Wonda Cheng, MD  11/27/2014 10:36 AM    [facility] [place], [place], [place]  [address] Phone: [phone]; Fax: [phone]

## 2014-11-26 NOTE — Progress Notes (Signed)
2D Echo with Definity completed. An echogenic substance noted in the echo.  Spoke with Dr. Radford Pax and Dr. Acie Fredrickson. Dr. Acie Fredrickson will see him tomorrow (11/27/2014). Stress test canceled by Dr. Radford Pax.  11/26/2014

## 2014-11-27 ENCOUNTER — Encounter: Payer: Self-pay | Admitting: Cardiovascular Disease

## 2014-11-27 ENCOUNTER — Ambulatory Visit (INDEPENDENT_AMBULATORY_CARE_PROVIDER_SITE_OTHER): Payer: 59 | Admitting: Cardiovascular Disease

## 2014-11-27 VITALS — BP 102/60 | HR 89 | Ht 71.0 in | Wt 198.0 lb

## 2014-11-27 DIAGNOSIS — Z954 Presence of other heart-valve replacement: Secondary | ICD-10-CM

## 2014-11-27 DIAGNOSIS — I330 Acute and subacute infective endocarditis: Secondary | ICD-10-CM

## 2014-11-27 LAB — CBC WITH DIFFERENTIAL/PLATELET
Basophils Absolute: 0 10*3/uL (ref 0.0–0.1)
Basophils Relative: 0.4 % (ref 0.0–3.0)
EOS ABS: 0.3 10*3/uL (ref 0.0–0.7)
Eosinophils Relative: 3.5 % (ref 0.0–5.0)
HCT: 40.4 % (ref 39.0–52.0)
Hemoglobin: 13.6 g/dL (ref 13.0–17.0)
LYMPHS PCT: 17.6 % (ref 12.0–46.0)
Lymphs Abs: 1.4 10*3/uL (ref 0.7–4.0)
MCHC: 33.8 g/dL (ref 30.0–36.0)
MCV: 85.3 fl (ref 78.0–100.0)
MONO ABS: 0.6 10*3/uL (ref 0.1–1.0)
Monocytes Relative: 6.9 % (ref 3.0–12.0)
Neutro Abs: 5.9 10*3/uL (ref 1.4–7.7)
Neutrophils Relative %: 71.6 % (ref 43.0–77.0)
PLATELETS: 174 10*3/uL (ref 150.0–400.0)
RBC: 4.73 Mil/uL (ref 4.22–5.81)
RDW: 14.3 % (ref 11.5–15.5)
WBC: 8.2 10*3/uL (ref 4.0–10.5)

## 2014-11-27 LAB — BASIC METABOLIC PANEL
BUN: 13 mg/dL (ref 6–23)
CHLORIDE: 106 meq/L (ref 96–112)
CO2: 27 mEq/L (ref 19–32)
CREATININE: 0.8 mg/dL (ref 0.40–1.50)
Calcium: 9.3 mg/dL (ref 8.4–10.5)
GFR: 108.78 mL/min (ref >60.00)
GLUCOSE: 198 mg/dL — AB (ref 70–99)
POTASSIUM: 4.4 meq/L (ref 3.5–5.1)
Sodium: 139 mEq/L (ref 135–145)

## 2014-11-27 LAB — SEDIMENTATION RATE: SED RATE: 1 mm/h (ref 0–22)

## 2014-11-27 NOTE — Patient Instructions (Signed)
Your physician recommends that you have lab work HERE today:  BMET, CBC, Sed rate  Your physician recommends you go to [facility] @  [address] For blood cultures  Your physician recommends you keep your March 8 appointment  Your physician recommends that you continue on your current medications as directed. Please refer to the Current Medication list given to you today.

## 2014-12-06 ENCOUNTER — Ambulatory Visit (HOSPITAL_COMMUNITY): Payer: 59 | Attending: Cardiology | Admitting: Radiology

## 2014-12-06 VITALS — BP 117/82 | HR 77 | Ht 71.0 in | Wt 198.0 lb

## 2014-12-06 DIAGNOSIS — R42 Dizziness and giddiness: Secondary | ICD-10-CM | POA: Diagnosis not present

## 2014-12-06 DIAGNOSIS — E119 Type 2 diabetes mellitus without complications: Secondary | ICD-10-CM | POA: Insufficient documentation

## 2014-12-06 DIAGNOSIS — R0789 Other chest pain: Secondary | ICD-10-CM

## 2014-12-06 DIAGNOSIS — I2510 Atherosclerotic heart disease of native coronary artery without angina pectoris: Secondary | ICD-10-CM | POA: Diagnosis not present

## 2014-12-06 DIAGNOSIS — R0609 Other forms of dyspnea: Secondary | ICD-10-CM | POA: Diagnosis not present

## 2014-12-06 DIAGNOSIS — R079 Chest pain, unspecified: Secondary | ICD-10-CM | POA: Diagnosis not present

## 2014-12-06 DIAGNOSIS — R5383 Other fatigue: Secondary | ICD-10-CM | POA: Insufficient documentation

## 2014-12-06 DIAGNOSIS — Z952 Presence of prosthetic heart valve: Secondary | ICD-10-CM

## 2014-12-06 MED ORDER — REGADENOSON 0.4 MG/5ML IV SOLN
0.4000 mg | Freq: Once | INTRAVENOUS | Status: AC
  Administered 2014-12-06: 0.4 mg via INTRAVENOUS

## 2014-12-06 MED ORDER — TECHNETIUM TC 99M SESTAMIBI GENERIC - CARDIOLITE
10.0000 | Freq: Once | INTRAVENOUS | Status: AC | PRN
  Administered 2014-12-06: 10 via INTRAVENOUS

## 2014-12-06 MED ORDER — TECHNETIUM TC 99M SESTAMIBI GENERIC - CARDIOLITE
30.0000 | Freq: Once | INTRAVENOUS | Status: AC | PRN
  Administered 2014-12-06: 30 via INTRAVENOUS

## 2014-12-06 NOTE — Progress Notes (Signed)
Mark Choi [address] [place], [place] [address] [phone]    Cardiology Nuclear Med Study  Mark Choi is a 49 y.o. male     MRN : [id number]     DOB: September 22, 1966  Procedure Date: 12/06/2014  Nuclear Med Background Indication for Stress Test:  Evaluation for Ischemia History:  CAD Cardiac Risk Factors: NIDDM  Symptoms:  Chest Pain with Exertion (last date of chest discomfort <1 week ago ), DOE, Fatigue, Light-Headedness, Palpitations and SOB   Nuclear Pre-Procedure Caffeine/Decaff Intake:  None NPO After: 7:00pm   Lungs:  clear O2 Sat: 98% on room air. IV 0.9% NS with Angio Cath:  20g  IV Site: R Hand  IV Started by:  Ileene Hutchinson, EMT-P  Chest Size (in): 42 Cup Size: n/a  Height: 5\' 11"  (1.803 m)  Weight:  198 lb (89.812 kg)  BMI:  Body mass index is 27.63 kg/(m^2). Tech Comments:  n/a    Nuclear Med Study 1 or 2 day study: 1 day  Stress Test Type:  Treadmill/Lexiscan  Reading MD: n/a  Order Authorizing Provider:  Jenkins Rouge, MD  Resting Radionuclide: Technetium 71m Sestamibi  Resting Radionuclide Dose: 11.0 mCi   Stress Radionuclide:  Technetium 24m Sestamibi  Stress Radionuclide Dose: 33.0 mCi           Stress Protocol Rest HR: 77 Stress HR: 101  Rest BP: 117/82 Stress BP: 119/72  Exercise Time (min): n/a METS: n/a   Predicted Max HR: 171 bpm % Max HR: 59.06 bpm Rate Pressure Product: 13433   Dose of Adenosine (mg):  n/a Dose of Lexiscan: 0.4 mg  Dose of Atropine (mg): n/a Dose of Dobutamine: n/a mcg/kg/min (at max HR)  Stress Test Technologist: Glade Lloyd, BS-ES  Nuclear Technologist:  Earl Many, CNMT     Rest Procedure:  Myocardial perfusion imaging was performed at rest 45 minutes following the intravenous administration of Technetium 50m Sestamibi. Rest ECG: SR 77 bpm  Nonsepcific ST T wave changes    Stress Procedure:  The patient received IV Lexiscan 0.4 mg over 15-seconds with concurrent low level  exercise and then Technetium 106m Sestamibi was injected at 30-seconds while the patient continued walking one more minute.  Quantitative spect images were obtained after a 45-minute delay.  During the infusion of Lexiscan the patient complained of SOB, lightheadedness and a headache.  These symptoms began to resolve in recovery.  Stress ECG: No significant change from baseline ECG  QPS Raw Data Images:SOft tissue (diaphragm, bowel activity) underlie heart.   Stress Images: Thinning with decreased tracer activity in the inferior walll (base), inferolateral wall (base, mid, distal) and apex.    Otherwise normal perfusion.  Rest Images:  Comparison with the stress images reveals no significant change. Subtraction (SDS):  No evidence of ischemia. Transient Ischemic Dilatation (Normal <1.22):  1.06 Lung/Heart Ratio (Normal <0.45):  0.35  Quantitative Gated Spect Images QGS EDV:  102 ml QGS ESV:  41 ml  Impression Exercise Capacity:  Lexiscan with low level exercise. BP Response:  Normal blood pressure response. Clinical Symptoms:  No chest pain. ECG Impression:  No significant ST segment change suggestive of ischemia. Comparison with Prior Nuclear Study: No prior study   Overall Impression  INferior/inferolatearl defect consistent with probable soft tissue attenuation, cannot exclude subendocardial scar.  No evidence of ischemia Overall probable low risk scan    LV Ejection Fraction: 60%.  LV Wall Motion: Normal thickening throughout    [place]  [place]

## 2014-12-17 ENCOUNTER — Encounter: Payer: Self-pay | Admitting: Cardiovascular Disease

## 2014-12-17 ENCOUNTER — Ambulatory Visit (INDEPENDENT_AMBULATORY_CARE_PROVIDER_SITE_OTHER): Payer: 59 | Admitting: Cardiovascular Disease

## 2014-12-17 VITALS — BP 114/78 | HR 80 | Ht 70.0 in | Wt 198.4 lb

## 2014-12-17 DIAGNOSIS — Z954 Presence of other heart-valve replacement: Secondary | ICD-10-CM

## 2014-12-17 DIAGNOSIS — I330 Acute and subacute infective endocarditis: Secondary | ICD-10-CM

## 2014-12-17 DIAGNOSIS — I2510 Atherosclerotic heart disease of native coronary artery without angina pectoris: Secondary | ICD-10-CM

## 2014-12-17 NOTE — Patient Instructions (Signed)
Your physician recommends that you continue on your current medications as directed. Please refer to the Current Medication list given to you today.  Your physician recommends that you schedule a follow-up appointment in: 3 months with Dr. Nahser.   

## 2014-12-17 NOTE — Progress Notes (Signed)
Cardiology Office Note   Date:  12/17/2014   ID:  Mark Choi, DOB 01/15/1966, MRN [id number]  PCP:  Maryelizabeth Rowan, MD  Cardiologist:   Vesta Mixer, MD   Chief Complaint  Patient presents with  . Follow-up    endocarditis   Problem List: 1. Status post aortic valve placement - 2010  2. Diabetes mellitus 3. Hyperlipidemia 4. Coronary artery disease -  CABG in 1998 , multiple stents following CABG  5. Hodgkin's disease - s/p mantal radiation, age 49.  6. 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 male who presents for follow up for his AVR.   Since I saw him several months ago, he has developed symptoms  He has had dyspnea.  With any sort of exertion.  Associated with lightheadedness.  Resolves after several months.  Has occasional dyspnea even with sitting   Not related to a URI that he had a week ago. Also has some tingling in his arms with exertion. Rare cp Has been going on for 1 1/2 months.  Progressive worsening.  These symptoms feel similar to his symptoms that he had prior to his CABG.    Feb. 17, 2016:  Mr. Kassabian had his echo yesterday and was  Found to have a vegetation on the aortic valve.  The vegetation is a filamentous strand like growth.  He has known endocarditis in 2014 and was treated at another hospital.  He has not had any symptoms of endocarditis.    Mark Choi is doing very well.  Not sick in any way. No fevers, rash,  No blood in urine  Has had migraine headaches.  Doubt that's related.   December 17, 2014:  Mark Choi is seen for follow up. He was found have a vegetation echocardiogram. He has a known history of endocarditis in the past. His blood cultures are negative. His sedimentation rate is 1. He continues to have symptoms of chest discomfort. He's not having any fevers or chills. Has exertional chest pain .  Any exertion causes moderate dyspnea.  Does not take NTG ( causes him  to have head aches)   Past Medical History  Diagnosis Date  . Hodgkin's disease     AGE 63 IN REMISSION  . Hyperlipidemia   . MDD (major depressive disorder)     STABLE  . GERD (gastroesophageal reflux disease)   . Diabetes mellitus without complication   . Coronary artery disease     Past Surgical History  Procedure Laterality Date  . Coronary artery bypass graft    . Heart stent      3 STENTS  . Angioplasty      5  . Artificial aortic valve      [facility]  . Aortic heart valve    . Cardiac surgery    . Cardiac stents       Current Outpatient Prescriptions  Medication Sig Dispense Refill  . aspirin 81 MG tablet Take 81 mg by mouth daily.    Marland Kitchen buPROPion (WELLBUTRIN SR) 150 MG 12 hr tablet Take 150 mg by mouth daily.    Marland Kitchen losartan (COZAAR) 25 MG tablet Take 25 mg by mouth daily.    . metFORMIN (GLUCOPHAGE) 1000 MG tablet Take 1,000 mg by mouth 2 (two) times daily with a meal.    . metoprolol tartrate (LOPRESSOR) 25 MG tablet Take 25 mg by mouth every evening.    Marland Kitchen  omeprazole (PRILOSEC) 20 MG capsule Take 20 mg by mouth daily.    . prochlorperazine (COMPAZINE) 10 MG tablet Take 1 tablet (10 mg total) by mouth 2 (two) times daily as needed for nausea or vomiting (Nausea ). 10 tablet 0  . rosuvastatin (CRESTOR) 40 MG tablet Take 40 mg by mouth daily.    . Testosterone 12.5 MG/ACT (1%) GEL Apply 12.5 mg topically daily.  1  . warfarin (COUMADIN) 2 MG tablet Take 3 mg by mouth on M/W/F and take 5 mg by mouth on the other days    . warfarin (COUMADIN) 3 MG tablet Take 3 mg by mouth on M/W/F and take 5 mg by mouth on the other days  1   No current facility-administered medications for this visit.    Allergies:   Review of patient's allergies indicates no known allergies.    Social History:  The patient  reports that he has never smoked. He does not have any smokeless tobacco history on file. He reports that he drinks alcohol. He reports that he does not use illicit drugs.    Family History:  The patient's family history includes Alcohol abuse in his mother; Alcoholism in his brother; Breast cancer in his mother; Depression in his brother; Diabetes type II in his father; Diverticulitis in his father.    ROS:  Please see the history of present illness.    Review of Systems: Constitutional:  denies fever, chills, diaphoresis, appetite change and fatigue.  HEENT: denies photophobia, eye pain, redness, hearing loss, ear pain, congestion, sore throat, rhinorrhea, sneezing, neck pain, neck stiffness and tinnitus.  Respiratory: admits to SOB, DOE,  Cardiovascular: denies chest pain, palpitations and leg swelling.  Gastrointestinal: denies nausea, vomiting, abdominal pain, diarrhea, constipation, blood in stool.  Genitourinary: denies dysuria, urgency, frequency, hematuria, flank pain and difficulty urinating.  Musculoskeletal: denies  myalgias, back pain, joint swelling, arthralgias and gait problem.   Skin: denies pallor, rash and wound.  Neurological: denies dizziness, seizures, syncope, weakness, light-headedness, numbness and headaches.   Hematological: denies adenopathy, easy bruising, personal or family bleeding history.  Psychiatric/ Behavioral: denies suicidal ideation, mood changes, confusion, nervousness, sleep disturbance and agitation.       All other systems are reviewed and negative.    PHYSICAL EXAM: VS:  BP 114/78 mmHg  Pulse 80  Ht $R'5\' 10"'GD$  (1.778 m)  Wt 198 lb 6.4 oz (89.994 kg)  BMI 28.47 kg/m2 , BMI Body mass index is 28.47 kg/(m^2). GEN: Well nourished, well developed, in no acute distress HEENT: normal Neck: no JVD, carotid bruits, or masses Cardiac: RRR; mechanical S2. no murmurs, rubs, or gallops,no edema  Respiratory:  clear to auscultation bilaterally, normal work of breathing GI: soft, nontender, nondistended, + BS MS: no deformity or atrophy Skin: warm and dry, no rash Neuro:  Strength and sensation are intact Psych:  normal   EKG:  EKG is not ordered today.    Recent Labs: 09/07/2014: ALT 30 11/27/2014: BUN 13; Creatinine 0.80; Hemoglobin 13.6; Platelets 174.0; Potassium 4.4; Sodium 139    Lipid Panel No results found for: CHOL, TRIG, HDL, CHOLHDL, VLDL, LDLCALC, LDLDIRECT    Wt Readings from Last 3 Encounters:  12/17/14 198 lb 6.4 oz (89.994 kg)  12/06/14 198 lb (89.812 kg)  11/27/14 198 lb (89.812 kg)      Other studies Reviewed: Additional studies/ records that were reviewed today include: . Review of the above records demonstrates:    ASSESSMENT AND PLAN:  Problem List: 1. Status  post aortic valve placement - 2010  - his echocardiogram  shows a filamentous structure attached to the aortic valve. I suspect that this is the vegetation from his episode of endocarditis in 2014.  He's not having any symptoms of endocarditis.  His blood cultures are negative.  ESR is 1 ( negative )   WBC is normal . No symptoms of endocarditis.  Will continue to follow .    2. Diabetes mellitus - followed by his general medical doctor 3. Hyperlipidemia 4. Coronary artery disease -  CABG 1998, [place]eson has had multiple stent procedures following his bypass surgery. The symptoms are very similar to his previous episodes of angina. His Myoview study was low risk. He does have a scar on the inferior wall.  , But hasn't to refer for cardiac catheter station at this point.  He has a vegetation on his aortic valve and there is a chance of dislodging the vegetation during cardiac catheterization. I would only want to do the cath  was clear that he was having symptoms of ischemia. I've encouraged him to start a regular exercise program.  5. Hodgkin's disease - s/p mantal radiation, age 10.   6. Hx of endocarditis in 2014 -  his echocardiogram yesterday showed a filamentous structure attached to the aortic valve. He's not having any symptoms of endocarditis and I suspect that this is sterile. This is probably the  vegetation leftover from his episode of endocarditis in 2014. He   Current medicines are reviewed at length with the patient today.  The patient does not have concerns regarding medicines.  The following changes have been made:  no change   Disposition:   FU with me in 3 month     Signed, Rashun Grattan, Wonda Cheng, MD  12/17/2014 8:19 AM    [facility] [place], [place], [place]  [address] Phone: [phone]; Fax: [phone]

## 2014-12-31 ENCOUNTER — Encounter: Payer: Self-pay | Admitting: Cardiovascular Disease

## 2015-03-12 LAB — PROTIME-INR: INR: 4.7 — AB (ref 0.9–1.1)

## 2015-03-17 LAB — PROTIME-INR: INR: 8.4 — AB (ref 0.9–1.1)

## 2015-03-19 LAB — PROTIME-INR: INR: 1.4 — AB (ref 0.9–1.1)

## 2015-04-01 ENCOUNTER — Ambulatory Visit (INDEPENDENT_AMBULATORY_CARE_PROVIDER_SITE_OTHER): Payer: 59 | Admitting: Cardiovascular Disease

## 2015-04-01 ENCOUNTER — Encounter: Payer: Self-pay | Admitting: Cardiovascular Disease

## 2015-04-01 VITALS — BP 110/60 | HR 97 | Ht 70.0 in | Wt 195.8 lb

## 2015-04-01 DIAGNOSIS — Z954 Presence of other heart-valve replacement: Secondary | ICD-10-CM | POA: Diagnosis not present

## 2015-04-01 DIAGNOSIS — I2510 Atherosclerotic heart disease of native coronary artery without angina pectoris: Secondary | ICD-10-CM | POA: Diagnosis not present

## 2015-04-01 NOTE — Progress Notes (Signed)
Cardiology Office Note   Date:  04/01/2015   ID:  Mark Choi, DOB 1966-04-23, MRN [id number]  PCP:  Rachell Cipro, MD  Cardiologist:   Thayer Headings, MD   Chief Complaint  Patient presents with  . Follow-up    cad, endocarditis    Problem List: 1. Status post aortic valve placement - 2010  2. Diabetes mellitus 3. Hyperlipidemia 4. Coronary artery disease -  CABG in 1998 , multiple stents following CABG  5. Hodgkin's disease - s/p mantal radiation, age 49.  6. 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 male who presents for follow up for his AVR.   Since I saw him several months ago, he has developed symptoms  He has had dyspnea.  With any sort of exertion.  Associated with lightheadedness.  Resolves after several months.  Has occasional dyspnea even with sitting   Not related to a URI that he had a week ago. Also has some tingling in his arms with exertion. Rare cp Has been going on for 1 1/2 months.  Progressive worsening.  These symptoms feel similar to his symptoms that he had prior to his CABG.    Feb. 17, 2016:  Mark Choi had his echo yesterday and was  Found to have a vegetation on the aortic valve.  The vegetation is a filamentous strand like growth.  He has known endocarditis in 2014 and was treated at another hospital.  He has not had any symptoms of endocarditis.    Mark Choi is doing very well.  Not sick in any way. No fevers, rash,  No blood in urine  Has had migraine headaches.  Doubt that's related.   December 17, 2014:  Mark Choi is seen for follow up. He was found have a vegetation echocardiogram. He has a known history of endocarditis in the past. His blood cultures are negative. His sedimentation rate is 1. He continues to have symptoms of chest discomfort. He's not having any fevers or chills. Has exertional chest pain .  Any exertion causes moderate dyspnea.  Does not take NTG (  causes him to have head aches)   April 01, 2015:   Having more orthostatic hypotension.   Also having trouble keeping  INR theraputic.   CP{ has not worsened.  HR is in the upper normal range.  He has tried more metoprolol but his BP got too low   Past Medical History  Diagnosis Date  . Hodgkin's disease     AGE 57 IN REMISSION  . Hyperlipidemia   . MDD (major depressive disorder)     STABLE  . GERD (gastroesophageal reflux disease)   . Diabetes mellitus without complication   . Coronary artery disease   . Allergic rhinitis   . Anterior pituitary disease   . Hodgkin disease   . Major depression   . Tachycardia   . HX: long term anticoagulant use   . Migraine   . Impacted cerumen   . Vitamin D deficiency   . Thrombophilia     Past Surgical History  Procedure Laterality Date  . Coronary artery bypass graft    . Heart stent      3 STENTS  . Angioplasty      5  . Artificial aortic valve      [facility]  . Aortic heart valve    . Cardiac surgery    . Cardiac stents  Current Outpatient Prescriptions  Medication Sig Dispense Refill  . aspirin 81 MG tablet Take 81 mg by mouth daily.    Marland Kitchen buPROPion (WELLBUTRIN XL) 300 MG 24 hr tablet Take 300 mg by mouth daily.    Marland Kitchen losartan (COZAAR) 25 MG tablet Take 25 mg by mouth daily.    . MEPHYTON 5 MG tablet TK 1 T PO D.  0  . metFORMIN (GLUCOPHAGE) 1000 MG tablet Take 1,000 mg by mouth 2 (two) times daily with a meal.    . metoprolol tartrate (LOPRESSOR) 25 MG tablet Take 25 mg by mouth every evening.    . Multiple Vitamins-Minerals (MENS MULTI VITAMIN & MINERAL) TABS Take 1 tablet by mouth daily.    Marland Kitchen omeprazole (PRILOSEC) 20 MG capsule Take 20 mg by mouth daily.    . prochlorperazine (COMPAZINE) 10 MG tablet Take 1 tablet (10 mg total) by mouth 2 (two) times daily as needed for nausea or vomiting (Nausea ). 10 tablet 0  . rosuvastatin (CRESTOR) 40 MG tablet Take 40 mg by mouth daily.    . Testosterone 12.5 MG/ACT (1%) GEL  Apply 12.5 mg topically daily.  1  . warfarin (COUMADIN) 2 MG tablet Take 3 mg by mouth on M/W/F and take 5 mg by mouth on the other days    . warfarin (COUMADIN) 3 MG tablet Take 3 mg by mouth on M/W/F and take 5 mg by mouth on the other days  1   No current facility-administered medications for this visit.    Allergies:   Review of patient's allergies indicates no known allergies.    Social History:  The patient  reports that he has never smoked. He does not have any smokeless tobacco history on file. He reports that he drinks alcohol. He reports that he does not use illicit drugs.   Family History:  The patient's family history includes Alcohol abuse in his mother; Alcoholism in his brother; Breast cancer in his mother; Depression in his brother; Diabetes type II in his father; Diverticulitis in his father.    ROS:  Please see the history of present illness.    Review of Systems: Constitutional:  denies fever, chills, diaphoresis, appetite change and fatigue.  HEENT: denies photophobia, eye pain, redness, hearing loss, ear pain, congestion, sore throat, rhinorrhea, sneezing, neck pain, neck stiffness and tinnitus.  Respiratory: admits to SOB, DOE,  Cardiovascular: denies chest pain, palpitations and leg swelling.  Gastrointestinal: denies nausea, vomiting, abdominal pain, diarrhea, constipation, blood in stool.  Genitourinary: denies dysuria, urgency, frequency, hematuria, flank pain and difficulty urinating.  Musculoskeletal: denies  myalgias, back pain, joint swelling, arthralgias and gait problem.   Skin: denies pallor, rash and wound.  Neurological: denies dizziness, seizures, syncope, weakness, light-headedness, numbness and headaches.   Hematological: denies adenopathy, easy bruising, personal or family bleeding history.  Psychiatric/ Behavioral: denies suicidal ideation, mood changes, confusion, nervousness, sleep disturbance and agitation.       All other systems are  reviewed and negative.    PHYSICAL EXAM: VS:  BP 110/60 mmHg  Pulse 97  Ht 5\' 10"  (1.778 m)  Wt 88.814 kg (195 lb 12.8 oz)  BMI 28.09 kg/m2  SpO2 97% , BMI Body mass index is 28.09 kg/(m^2). GEN: Well nourished, well developed, in no acute distress HEENT: normal Neck: no JVD, carotid bruits, or masses Cardiac: RRR; mechanical S2. no murmurs, rubs, or gallops,no edema  Respiratory:  clear to auscultation bilaterally, normal work of breathing GI: soft, nontender, nondistended, + BS  MS: no deformity or atrophy Skin: warm and dry, no rash Neuro:  Strength and sensation are intact Psych: normal   EKG:  EKG is not ordered today.    Recent Labs: 09/07/2014: ALT 30 11/27/2014: BUN 13; Creatinine, Ser 0.80; Hemoglobin 13.6; Platelets 174.0; Potassium 4.4; Sodium 139    Lipid Panel No results found for: CHOL, TRIG, HDL, CHOLHDL, VLDL, LDLCALC, LDLDIRECT    Wt Readings from Last 3 Encounters:  04/01/15 88.814 kg (195 lb 12.8 oz)  12/17/14 89.994 kg (198 lb 6.4 oz)  12/06/14 89.812 kg (198 lb)      Other studies Reviewed: Additional studies/ records that were reviewed today include: . Review of the above records demonstrates:    ASSESSMENT AND PLAN:  Problem List: 1. Status post aortic valve placement - 2010  -  His INR levels have been quite variable. We will refer him to our Coumadin clinic to follow his INR levels.  2. Diabetes mellitus - followed by his general medical doctor  3. Hyperlipidemia -  Will will check fasting lipids, liver enzymes, and basic metabolic profile when I see him again in 6 month.  4. Coronary artery disease -  CABG 1998, Mark Choi has had multiple stent procedures following his bypass surgery. The symptoms are very similar to his previous episodes of angina. His Myoview study was low risk. He does have a scar on the inferior wall.  5. Hodgkin's disease - s/p mantal radiation, age 30.   6. Hx of endocarditis in 2014 -  he seems very stable. We  found a long filamentous vegetation on his aortic valve. His workup included blood cultures and sedimentation rate which were normal. He has no symptoms. We will continue to monitor. He will need to take SBE prophylaxis prior to any dental procedures.   Current medicines are reviewed at length with the patient today.  The patient does not have concerns regarding medicines.  The following changes have been made:  no change   Disposition:   FU with me in 6 months      Signed, Myleah Cavendish, Wonda Cheng, MD  04/01/2015 9:56 AM    [facility] [place], [place], [place]  [address] Phone: [phone]; Fax: [phone]

## 2015-04-01 NOTE — Patient Instructions (Addendum)
Medication Instructions:  Your physician recommends that you continue on your current medications as directed. Please refer to the Current Medication list given to you today.   Labwork: Your physician recommends that you return for lab work in: 6 months on the day of or a few days before your office visit with Dr. Acie Fredrickson.  You will need to FAST for this appointment - nothing to eat or drink after midnight the night before except water.   Testing/Procedures: None Ordered  Follow-Up: You have been referred to Coumadin Clinic - please schedule an appointment    Your physician wants you to follow-up in: 6 months with Dr. Acie Fredrickson.  You will receive a reminder letter in the mail two months in advance. If you don't receive a letter, please call our office to schedule the follow-up appointment.

## 2015-04-03 ENCOUNTER — Ambulatory Visit (INDEPENDENT_AMBULATORY_CARE_PROVIDER_SITE_OTHER): Payer: 59

## 2015-04-03 DIAGNOSIS — Z954 Presence of other heart-valve replacement: Secondary | ICD-10-CM

## 2015-04-03 DIAGNOSIS — Z5181 Encounter for therapeutic drug level monitoring: Secondary | ICD-10-CM | POA: Diagnosis not present

## 2015-04-03 LAB — PROTIME-INR
INR: 8.1 ratio (ref 0.8–1.0)
Prothrombin Time: 84.9 s (ref 9.6–13.1)

## 2015-04-03 LAB — POCT INR: INR: 7.8

## 2015-04-03 NOTE — Patient Instructions (Signed)

## 2015-04-07 ENCOUNTER — Ambulatory Visit (INDEPENDENT_AMBULATORY_CARE_PROVIDER_SITE_OTHER): Payer: 59 | Admitting: Pharmacist

## 2015-04-07 DIAGNOSIS — Z954 Presence of other heart-valve replacement: Secondary | ICD-10-CM

## 2015-04-07 DIAGNOSIS — Z5181 Encounter for therapeutic drug level monitoring: Secondary | ICD-10-CM

## 2015-04-07 LAB — POCT INR: INR: 1.5

## 2015-04-11 ENCOUNTER — Ambulatory Visit (INDEPENDENT_AMBULATORY_CARE_PROVIDER_SITE_OTHER): Payer: 59

## 2015-04-11 DIAGNOSIS — Z5181 Encounter for therapeutic drug level monitoring: Secondary | ICD-10-CM | POA: Diagnosis not present

## 2015-04-11 DIAGNOSIS — Z954 Presence of other heart-valve replacement: Secondary | ICD-10-CM | POA: Diagnosis not present

## 2015-04-11 LAB — POCT INR: INR: 1.6

## 2015-04-25 ENCOUNTER — Ambulatory Visit (INDEPENDENT_AMBULATORY_CARE_PROVIDER_SITE_OTHER): Payer: 59 | Admitting: Pharmacist

## 2015-04-25 DIAGNOSIS — Z954 Presence of other heart-valve replacement: Secondary | ICD-10-CM

## 2015-04-25 DIAGNOSIS — Z5181 Encounter for therapeutic drug level monitoring: Secondary | ICD-10-CM

## 2015-04-25 LAB — POCT INR: INR: 3.2

## 2015-05-09 ENCOUNTER — Ambulatory Visit (INDEPENDENT_AMBULATORY_CARE_PROVIDER_SITE_OTHER): Payer: 59 | Admitting: *Deleted

## 2015-05-09 DIAGNOSIS — Z5181 Encounter for therapeutic drug level monitoring: Secondary | ICD-10-CM | POA: Diagnosis not present

## 2015-05-09 DIAGNOSIS — Z954 Presence of other heart-valve replacement: Secondary | ICD-10-CM | POA: Diagnosis not present

## 2015-05-09 LAB — POCT INR: INR: 1.8

## 2015-05-23 ENCOUNTER — Ambulatory Visit (INDEPENDENT_AMBULATORY_CARE_PROVIDER_SITE_OTHER): Payer: 59 | Admitting: Pharmacist

## 2015-05-23 DIAGNOSIS — Z5181 Encounter for therapeutic drug level monitoring: Secondary | ICD-10-CM

## 2015-05-23 DIAGNOSIS — Z954 Presence of other heart-valve replacement: Secondary | ICD-10-CM

## 2015-05-23 LAB — POCT INR: INR: 3.2

## 2015-05-23 MED ORDER — WARFARIN SODIUM 3 MG PO TABS: 3.0000 mg | ORAL_TABLET | ORAL | Status: DC

## 2015-06-06 ENCOUNTER — Ambulatory Visit (INDEPENDENT_AMBULATORY_CARE_PROVIDER_SITE_OTHER): Payer: 59 | Admitting: *Deleted

## 2015-06-06 DIAGNOSIS — Z5181 Encounter for therapeutic drug level monitoring: Secondary | ICD-10-CM

## 2015-06-06 DIAGNOSIS — Z954 Presence of other heart-valve replacement: Secondary | ICD-10-CM | POA: Diagnosis not present

## 2015-06-06 LAB — POCT INR: INR: 1.8

## 2015-06-20 ENCOUNTER — Ambulatory Visit (INDEPENDENT_AMBULATORY_CARE_PROVIDER_SITE_OTHER): Payer: 59 | Admitting: *Deleted

## 2015-06-20 DIAGNOSIS — Z5181 Encounter for therapeutic drug level monitoring: Secondary | ICD-10-CM | POA: Diagnosis not present

## 2015-06-20 DIAGNOSIS — Z954 Presence of other heart-valve replacement: Secondary | ICD-10-CM

## 2015-06-20 LAB — POCT INR: INR: 3.1

## 2015-07-04 ENCOUNTER — Ambulatory Visit (INDEPENDENT_AMBULATORY_CARE_PROVIDER_SITE_OTHER): Payer: 59 | Admitting: Pharmacist

## 2015-07-04 DIAGNOSIS — Z954 Presence of other heart-valve replacement: Secondary | ICD-10-CM | POA: Diagnosis not present

## 2015-07-04 DIAGNOSIS — Z5181 Encounter for therapeutic drug level monitoring: Secondary | ICD-10-CM | POA: Diagnosis not present

## 2015-07-04 LAB — POCT INR: INR: 2.4

## 2015-07-25 ENCOUNTER — Ambulatory Visit (INDEPENDENT_AMBULATORY_CARE_PROVIDER_SITE_OTHER): Payer: 59 | Admitting: Pharmacist

## 2015-07-25 DIAGNOSIS — Z954 Presence of other heart-valve replacement: Secondary | ICD-10-CM

## 2015-07-25 DIAGNOSIS — Z5181 Encounter for therapeutic drug level monitoring: Secondary | ICD-10-CM

## 2015-07-25 LAB — POCT INR: INR: 2.8

## 2015-08-18 ENCOUNTER — Ambulatory Visit (INDEPENDENT_AMBULATORY_CARE_PROVIDER_SITE_OTHER): Payer: 59 | Admitting: *Deleted

## 2015-08-18 DIAGNOSIS — Z954 Presence of other heart-valve replacement: Secondary | ICD-10-CM

## 2015-08-18 DIAGNOSIS — Z5181 Encounter for therapeutic drug level monitoring: Secondary | ICD-10-CM | POA: Diagnosis not present

## 2015-08-18 LAB — POCT INR: INR: 3.5

## 2015-09-08 ENCOUNTER — Ambulatory Visit (INDEPENDENT_AMBULATORY_CARE_PROVIDER_SITE_OTHER): Payer: 59

## 2015-09-08 DIAGNOSIS — Z954 Presence of other heart-valve replacement: Secondary | ICD-10-CM

## 2015-09-08 DIAGNOSIS — Z5181 Encounter for therapeutic drug level monitoring: Secondary | ICD-10-CM | POA: Diagnosis not present

## 2015-09-08 LAB — POCT INR: INR: 2.6

## 2015-09-08 MED ORDER — WARFARIN SODIUM 3 MG PO TABS: 3.0000 mg | ORAL_TABLET | ORAL | Status: DC

## 2015-10-16 ENCOUNTER — Other Ambulatory Visit (INDEPENDENT_AMBULATORY_CARE_PROVIDER_SITE_OTHER): Payer: 59 | Admitting: *Deleted

## 2015-10-16 DIAGNOSIS — Z5181 Encounter for therapeutic drug level monitoring: Secondary | ICD-10-CM

## 2015-10-16 DIAGNOSIS — I2510 Atherosclerotic heart disease of native coronary artery without angina pectoris: Secondary | ICD-10-CM

## 2015-10-16 DIAGNOSIS — Z954 Presence of other heart-valve replacement: Secondary | ICD-10-CM

## 2015-10-16 DIAGNOSIS — I2583 Coronary atherosclerosis due to lipid rich plaque: Principal | ICD-10-CM

## 2015-10-16 LAB — HEPATIC FUNCTION PANEL
ALT: 27 U/L (ref 9–46)
AST: 22 U/L (ref 10–35)
Albumin: 4 g/dL (ref 3.6–5.1)
Alkaline Phosphatase: 56 U/L (ref 40–115)
BILIRUBIN DIRECT: 0.1 mg/dL (ref <=0.2)
BILIRUBIN INDIRECT: 0.3 mg/dL (ref 0.2–1.2)
Total Bilirubin: 0.4 mg/dL (ref 0.2–1.2)
Total Protein: 6.7 g/dL (ref 6.1–8.1)

## 2015-10-16 LAB — LIPID PANEL
CHOL/HDL RATIO: 3.6 ratio (ref <=5.0)
CHOLESTEROL: 97 mg/dL — AB (ref 125–200)
HDL: 27 mg/dL — ABNORMAL LOW (ref >=40)
LDL Cholesterol: 28 mg/dL (ref <130)
TRIGLYCERIDES: 208 mg/dL — AB (ref <150)
VLDL: 42 mg/dL — ABNORMAL HIGH (ref <30)

## 2015-10-16 LAB — BASIC METABOLIC PANEL
BUN: 20 mg/dL (ref 7–25)
CALCIUM: 9.3 mg/dL (ref 8.6–10.3)
CO2: 25 mmol/L (ref 20–31)
Chloride: 103 mmol/L (ref 98–110)
Creat: 0.95 mg/dL (ref 0.70–1.33)
GLUCOSE: 146 mg/dL — AB (ref 65–99)
POTASSIUM: 4.1 mmol/L (ref 3.5–5.3)
SODIUM: 138 mmol/L (ref 135–146)

## 2015-10-16 NOTE — Addendum Note (Signed)
Addended by: Eulis Foster on: 10/16/2015 07:52 AM   Modules accepted: Orders

## 2015-10-16 NOTE — Addendum Note (Signed)
Addended by: Eulis Foster on: 10/16/2015 08:59 AM   Modules accepted: Orders

## 2015-10-21 ENCOUNTER — Ambulatory Visit (INDEPENDENT_AMBULATORY_CARE_PROVIDER_SITE_OTHER): Payer: 59 | Admitting: Cardiovascular Disease

## 2015-10-21 ENCOUNTER — Encounter: Payer: Self-pay | Admitting: Cardiovascular Disease

## 2015-10-21 VITALS — BP 118/76 | HR 88 | Ht 71.0 in | Wt 191.8 lb

## 2015-10-21 DIAGNOSIS — Z8679 Personal history of other diseases of the circulatory system: Secondary | ICD-10-CM | POA: Diagnosis not present

## 2015-10-21 DIAGNOSIS — Z954 Presence of other heart-valve replacement: Secondary | ICD-10-CM

## 2015-10-21 DIAGNOSIS — I330 Acute and subacute infective endocarditis: Secondary | ICD-10-CM

## 2015-10-21 DIAGNOSIS — I25119 Atherosclerotic heart disease of native coronary artery with unspecified angina pectoris: Secondary | ICD-10-CM | POA: Diagnosis not present

## 2015-10-21 DIAGNOSIS — Z952 Presence of prosthetic heart valve: Secondary | ICD-10-CM

## 2015-10-21 MED ORDER — NITROGLYCERIN 0.4 MG SL SUBL: 0.4000 mg | SUBLINGUAL_TABLET | SUBLINGUAL | Status: AC | PRN

## 2015-10-21 NOTE — Patient Instructions (Signed)
Medication Instructions:  TAKE Nitroglycerin as needed for chest pain - place 1 pill under your tongue; you may repeat 5 minutes later x 2 for chest pain.  If pain persists call 911 or have someone drive you to ER   Labwork: None Ordered   Testing/Procedures: None Ordered  Follow-Up: Your physician wants you to follow-up in: 6 months with Dr. Acie Fredrickson.  You will receive a reminder letter in the mail two months in advance. If you don't receive a letter, please call our office to schedule the follow-up appointment.  If you need a refill on your cardiac medications before your next appointment, please call your pharmacy.   Thank you for choosing [facility]! Christen Bame, RN [phone]

## 2015-10-21 NOTE — Progress Notes (Signed)
Cardiology Office Note   Date:  10/21/2015   ID:  Mark Choi, DOB 1966/04/22, MRN [id number]  PCP:  Mark Cipro, MD  Cardiologist:   Mark Headings, MD   Chief Complaint  Patient presents with  . Follow-up   Problem List: 1. Status post aortic valve placement - 2010  2. Diabetes mellitus 3. Hyperlipidemia 4. Coronary artery disease -  CABG in 1998 , multiple stents following CABG  5. Hodgkin's disease - s/p mantal radiation, age 50.  6. 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 male who presents for follow up for his AVR.   Since I saw him several months ago, he has developed symptoms  He has had dyspnea.  With any sort of exertion.  Associated with lightheadedness.  Resolves after several months.  Has occasional dyspnea even with sitting   Not related to a URI that he had a week ago. Also has some tingling in his arms with exertion. Rare cp Has been going on for 1 1/2 months.  Progressive worsening.  These symptoms feel similar to his symptoms that he had prior to his CABG.    Feb. 17, 2016:  Mark Choi had his echo yesterday and was  Found to have a vegetation on the aortic valve.  The vegetation is a filamentous strand like growth.  He has known endocarditis in 2014 and was treated at another hospital.  He has not had any symptoms of endocarditis.    Mark Choi is doing very well.  Not sick in any way. No fevers, rash,  No blood in urine  Has had migraine headaches.  Doubt that's related.   December 17, 2014:  Mark Choi is seen for follow up. He was found have a vegetation on echocardiogram. He has a known history of endocarditis in the past. His blood cultures are negative. His sedimentation rate is 1. He continues to have symptoms of chest discomfort. He's not having any fevers or chills. Has exertional chest pain .  Any exertion causes moderate dyspnea.  Does not take NTG ( causes him to have head  aches)   April 01, 2015:   Having more orthostatic hypotension.   Also having trouble keeping  INR theraputic.   CP{ has not worsened.  HR is in the upper normal range.  He has tried more metoprolol but his BP got too low   Jan. 10, 2017:  Mark Choi is seen for follow up for his history of endocarditis and coronary artery disease. He has continued to have episodes of angina. I've reviewed the Myoview study from last year. I've also reviewed the echocardiogram.  The angina seems to be limiting his exertion.   Does not want to take NTG because of the associated headache.   Past Medical History  Diagnosis Date  . Hodgkin's disease ([place])     AGE 45 IN REMISSION  . Hyperlipidemia   . MDD (major depressive disorder) ([place])     STABLE  . GERD (gastroesophageal reflux disease)   . Diabetes mellitus without complication ([place])   . Coronary artery disease   . Allergic rhinitis   . 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 Hodgkin disease ([place])   . Major depression ([place])   . Tachycardia   . HX: long term anticoagulant use   . Migraine   . Impacted cerumen   . Vitamin D deficiency   . Thrombophilia ([place])  Past Surgical History  Procedure Laterality Date  . Coronary artery bypass graft    . Heart stent      3 STENTS  . Angioplasty      5  . Artificial aortic valve      ST JUDES  . Aortic heart valve    . Cardiac surgery    . Cardiac stents       Current Outpatient Prescriptions  Medication Sig Dispense Refill  . aspirin 81 MG tablet Take 81 mg by mouth daily.    Marland Kitchen buPROPion (WELLBUTRIN XL) 300 MG 24 hr tablet Take 300 mg by mouth daily.    Marland Kitchen losartan (COZAAR) 25 MG tablet Take 25 mg by mouth daily.    . metFORMIN (GLUCOPHAGE) 1000 MG tablet Take 1,000 mg by mouth 2 (two) times daily with a meal.    . metoprolol tartrate (LOPRESSOR) 25 MG tablet Take 25 mg by mouth every evening.    . Multiple Vitamins-Minerals (MENS MULTI VITAMIN & MINERAL) TABS Take 1 tablet by mouth daily.    Marland Kitchen  omeprazole (PRILOSEC) 20 MG capsule Take 20 mg by mouth daily.    . prochlorperazine (COMPAZINE) 10 MG tablet Take 1 tablet (10 mg total) by mouth 2 (two) times daily as needed for nausea or vomiting (Nausea ). 10 tablet 0  . rosuvastatin (CRESTOR) 40 MG tablet Take 40 mg by mouth daily.    . Testosterone 12.5 MG/ACT (1%) GEL Apply 12.5 mg topically daily.  1  . warfarin (COUMADIN) 2 MG tablet Take 3 mg by mouth on M/W/F and take 5 mg by mouth on the other days    . warfarin (COUMADIN) 3 MG tablet Take 1 tablet (3 mg total) by mouth as directed. Take 3 mg by mouth as directed by [facility] 120 tablet 1   No current facility-administered medications for this visit.    Allergies:   Review of patient's allergies indicates no known allergies.    Social History:  The patient  reports that he has never smoked. He does not have any smokeless tobacco history on file. He reports that he drinks alcohol. He reports that he does not use illicit drugs.   Family History:  The patient's family history includes Alcohol abuse in his mother; Alcoholism in his brother; Breast cancer in his mother; Depression in his brother; Diabetes type II in his father; Diverticulitis in his father.    ROS:  Please see the history of present illness.    Review of Systems: Constitutional:  denies fever, chills, diaphoresis, appetite change and fatigue.  HEENT: denies photophobia, eye pain, redness, hearing loss, ear pain, congestion, sore throat, rhinorrhea, sneezing, neck pain, neck stiffness and tinnitus.  Respiratory: admits to SOB, DOE,  Cardiovascular: denies chest pain, palpitations and leg swelling.  Gastrointestinal: denies nausea, vomiting, abdominal pain, diarrhea, constipation, blood in stool.  Genitourinary: denies dysuria, urgency, frequency, hematuria, flank pain and difficulty urinating.  Musculoskeletal: denies  myalgias, back pain, joint swelling, arthralgias and gait problem.   Skin: denies pallor,  rash and wound.  Neurological: denies dizziness, seizures, syncope, weakness, light-headedness, numbness and headaches.   Hematological: denies adenopathy, easy bruising, personal or family bleeding history.  Psychiatric/ Behavioral: denies suicidal ideation, mood changes, confusion, nervousness, sleep disturbance and agitation.       All other systems are reviewed and negative.    PHYSICAL EXAM: VS:  BP 118/76 mmHg  Pulse 88  Ht 5\' 11"  (1.803 m)  Wt 191 lb 12.8 oz (87  kg)  BMI 26.76 kg/m2 , BMI Body mass index is 26.76 kg/(m^2). GEN: Well nourished, well developed, in no acute distress HEENT: normal Neck: no JVD, carotid bruits, or masses Cardiac: RRR; mechanical S2. no murmurs, rubs, or gallops,no edema  Respiratory:  clear to auscultation bilaterally, normal work of breathing GI: soft, nontender, nondistended, + BS MS: no deformity or atrophy Skin: warm and dry, no rash Neuro:  Strength and sensation are intact Psych: normal   EKG:  EKG is   ordered today. NSR at 88. 1st degree AV block , right axis deviation    Recent Labs: 11/27/2014: Hemoglobin 13.6; Platelets 174.0 10/16/2015: ALT 27; BUN 20; Creat 0.95; Potassium 4.1; Sodium 138    Lipid Panel    Component Value Date/Time   CHOL 97* 10/16/2015 0859   TRIG 208* 10/16/2015 0859   HDL 27* 10/16/2015 0859   CHOLHDL 3.6 10/16/2015 0859   VLDL 42* 10/16/2015 0859   LDLCALC 28 10/16/2015 0859      Wt Readings from Last 3 Encounters:  10/21/15 191 lb 12.8 oz (87 kg)  04/01/15 195 lb 12.8 oz (88.814 kg)  12/17/14 198 lb 6.4 oz (89.994 kg)      Other studies Reviewed: Additional studies/ records that were reviewed today include: . Review of the above records demonstrates:    ASSESSMENT AND PLAN:  Problem List: 1. Status post aortic valve placement - 2010  -  His INR levels have been quite variable. We will refer him to our Coumadin clinic to follow his INR levels.  2. Diabetes mellitus - followed by his  general medical doctor  3. Hyperlipidemia -  Will will check fasting lipids, liver enzymes, and basic metabolic profile when I see him again in 6 month.  4. Coronary artery disease -  CABG 1998, Arlo has had multiple stent procedures following his bypass surgery. The symptoms are very similar to his previous episodes of angina. His Myoview study was low risk. He does have a scar on the inferior wall. He continues to have episodes of chest pain with exertion. He does not like taking nitroglycerin because he gets him such a headache. We will refill his nitroglycerin and up occurs to take it if the chest pains get to bed. It's possible that he has balanced ischemia which subsequently caused the Myoview study to be low risk. We'll keep an eye on this.  5. Hodgkin's disease - s/p mantal radiation, age 48.   6. Hx of endocarditis in 2014 -  he seems very stable. We found a long filamentous vegetation on his aortic valve. His workup included blood cultures and sedimentation rate which were normal. He has no symptoms. We will continue to monitor. He will need to take SBE prophylaxis prior to any dental procedures.   Current medicines are reviewed at length with the patient today.  The patient does not have concerns regarding medicines.  The following changes have been made:  no change   Disposition:   FU with me in 6 months      Signed, Nahser, Wonda Cheng, MD  10/21/2015 11:58 AM    [facility] [place], [place], [place]  [address] Phone: [phone]; Fax: [phone]

## 2015-12-22 ENCOUNTER — Ambulatory Visit (INDEPENDENT_AMBULATORY_CARE_PROVIDER_SITE_OTHER): Payer: 59 | Admitting: *Deleted

## 2015-12-22 DIAGNOSIS — Z5181 Encounter for therapeutic drug level monitoring: Secondary | ICD-10-CM | POA: Diagnosis not present

## 2015-12-22 DIAGNOSIS — Z954 Presence of other heart-valve replacement: Secondary | ICD-10-CM

## 2015-12-22 LAB — POCT INR: INR: 2.4

## 2016-01-19 ENCOUNTER — Ambulatory Visit (INDEPENDENT_AMBULATORY_CARE_PROVIDER_SITE_OTHER): Payer: 59

## 2016-01-19 DIAGNOSIS — Z5181 Encounter for therapeutic drug level monitoring: Secondary | ICD-10-CM | POA: Diagnosis not present

## 2016-01-19 DIAGNOSIS — Z954 Presence of other heart-valve replacement: Secondary | ICD-10-CM | POA: Diagnosis not present

## 2016-01-19 LAB — POCT INR: INR: 1.4

## 2016-02-02 ENCOUNTER — Ambulatory Visit (INDEPENDENT_AMBULATORY_CARE_PROVIDER_SITE_OTHER): Payer: 59

## 2016-02-02 DIAGNOSIS — Z5181 Encounter for therapeutic drug level monitoring: Secondary | ICD-10-CM

## 2016-02-02 DIAGNOSIS — Z954 Presence of other heart-valve replacement: Secondary | ICD-10-CM

## 2016-02-02 LAB — POCT INR: INR: 3.3

## 2016-02-16 ENCOUNTER — Ambulatory Visit (INDEPENDENT_AMBULATORY_CARE_PROVIDER_SITE_OTHER): Payer: 59 | Admitting: *Deleted

## 2016-02-16 DIAGNOSIS — Z954 Presence of other heart-valve replacement: Secondary | ICD-10-CM | POA: Diagnosis not present

## 2016-02-16 DIAGNOSIS — Z5181 Encounter for therapeutic drug level monitoring: Secondary | ICD-10-CM

## 2016-02-16 LAB — POCT INR: INR: 2.3

## 2016-03-02 ENCOUNTER — Ambulatory Visit (INDEPENDENT_AMBULATORY_CARE_PROVIDER_SITE_OTHER): Payer: 59 | Admitting: *Deleted

## 2016-03-02 DIAGNOSIS — Z954 Presence of other heart-valve replacement: Secondary | ICD-10-CM | POA: Diagnosis not present

## 2016-03-02 DIAGNOSIS — Z5181 Encounter for therapeutic drug level monitoring: Secondary | ICD-10-CM | POA: Diagnosis not present

## 2016-03-02 LAB — POCT INR: INR: 1.4

## 2016-03-09 ENCOUNTER — Ambulatory Visit (INDEPENDENT_AMBULATORY_CARE_PROVIDER_SITE_OTHER): Payer: 59

## 2016-03-09 DIAGNOSIS — Z954 Presence of other heart-valve replacement: Secondary | ICD-10-CM

## 2016-03-09 DIAGNOSIS — Z5181 Encounter for therapeutic drug level monitoring: Secondary | ICD-10-CM | POA: Diagnosis not present

## 2016-03-09 LAB — POCT INR: INR: 1.4

## 2016-03-09 MED ORDER — WARFARIN SODIUM 3 MG PO TABS: 3.0000 mg | ORAL_TABLET | ORAL | Status: DC

## 2016-03-10 ENCOUNTER — Emergency Department (HOSPITAL_COMMUNITY): Payer: 59

## 2016-03-10 ENCOUNTER — Emergency Department (HOSPITAL_COMMUNITY)
Admission: EM | Admit: 2016-03-10 | Discharge: 2016-03-10 | Disposition: A | Discharge status: Home / Self Care | Payer: 59 | Attending: Emergency Medicine | Admitting: Emergency Medicine

## 2016-03-10 ENCOUNTER — Encounter (HOSPITAL_COMMUNITY): Payer: Self-pay | Admitting: Emergency Medicine

## 2016-03-10 DIAGNOSIS — Z7984 Long term (current) use of oral hypoglycemic drugs: Secondary | ICD-10-CM | POA: Diagnosis not present

## 2016-03-10 DIAGNOSIS — F329 Major depressive disorder, single episode, unspecified: Secondary | ICD-10-CM | POA: Diagnosis not present

## 2016-03-10 DIAGNOSIS — K219 Gastro-esophageal reflux disease without esophagitis: Secondary | ICD-10-CM | POA: Diagnosis not present

## 2016-03-10 DIAGNOSIS — E785 Hyperlipidemia, unspecified: Secondary | ICD-10-CM | POA: Insufficient documentation

## 2016-03-10 DIAGNOSIS — R509 Fever, unspecified: Secondary | ICD-10-CM | POA: Insufficient documentation

## 2016-03-10 DIAGNOSIS — E119 Type 2 diabetes mellitus without complications: Secondary | ICD-10-CM | POA: Diagnosis not present

## 2016-03-10 DIAGNOSIS — R0602 Shortness of breath: Secondary | ICD-10-CM | POA: Insufficient documentation

## 2016-03-10 DIAGNOSIS — Z862 Personal history of diseases of the blood and blood-forming organs and certain disorders involving the immune mechanism: Secondary | ICD-10-CM | POA: Insufficient documentation

## 2016-03-10 DIAGNOSIS — I2510 Atherosclerotic heart disease of native coronary artery without angina pectoris: Secondary | ICD-10-CM | POA: Insufficient documentation

## 2016-03-10 DIAGNOSIS — Z7901 Long term (current) use of anticoagulants: Secondary | ICD-10-CM | POA: Insufficient documentation

## 2016-03-10 DIAGNOSIS — Z8571 Personal history of Hodgkin lymphoma: Secondary | ICD-10-CM | POA: Diagnosis not present

## 2016-03-10 DIAGNOSIS — Z7982 Long term (current) use of aspirin: Secondary | ICD-10-CM | POA: Insufficient documentation

## 2016-03-10 DIAGNOSIS — G43909 Migraine, unspecified, not intractable, without status migrainosus: Secondary | ICD-10-CM | POA: Diagnosis not present

## 2016-03-10 DIAGNOSIS — R002 Palpitations: Secondary | ICD-10-CM | POA: Diagnosis not present

## 2016-03-10 DIAGNOSIS — R42 Dizziness and giddiness: Secondary | ICD-10-CM | POA: Insufficient documentation

## 2016-03-10 DIAGNOSIS — Z79899 Other long term (current) drug therapy: Secondary | ICD-10-CM | POA: Diagnosis not present

## 2016-03-10 LAB — COMPREHENSIVE METABOLIC PANEL
ALBUMIN: 4.4 g/dL (ref 3.5–5.0)
ALK PHOS: 55 U/L (ref 38–126)
ALT: 30 U/L (ref 17–63)
ANION GAP: 11 (ref 5–15)
AST: 27 U/L (ref 15–41)
BILIRUBIN TOTAL: 1.3 mg/dL — AB (ref 0.3–1.2)
BUN: 13 mg/dL (ref 6–20)
CALCIUM: 9.3 mg/dL (ref 8.9–10.3)
CO2: 23 mmol/L (ref 22–32)
Chloride: 102 mmol/L (ref 101–111)
Creatinine, Ser: 0.92 mg/dL (ref 0.61–1.24)
GFR calc Af Amer: >60 mL/min (ref >60)
GFR calc non Af Amer: >60 mL/min (ref >60)
GLUCOSE: 126 mg/dL — AB (ref 65–99)
POTASSIUM: 3.9 mmol/L (ref 3.5–5.1)
SODIUM: 136 mmol/L (ref 135–145)
Total Protein: 7.2 g/dL (ref 6.5–8.1)

## 2016-03-10 LAB — CBC WITH DIFFERENTIAL/PLATELET
BASOS ABS: 0 10*3/uL (ref 0.0–0.1)
BASOS PCT: 0 %
EOS ABS: 0.2 10*3/uL (ref 0.0–0.7)
Eosinophils Relative: 1 %
HEMATOCRIT: 42.9 % (ref 39.0–52.0)
HEMOGLOBIN: 14 g/dL (ref 13.0–17.0)
LYMPHS PCT: 19 %
Lymphs Abs: 3.3 10*3/uL (ref 0.7–4.0)
MCH: 27.3 pg (ref 26.0–34.0)
MCHC: 32.6 g/dL (ref 30.0–36.0)
MCV: 83.6 fL (ref 78.0–100.0)
MONO ABS: 1.1 10*3/uL — AB (ref 0.1–1.0)
Monocytes Relative: 6 %
NEUTROS PCT: 74 %
Neutro Abs: 12.9 10*3/uL — ABNORMAL HIGH (ref 1.7–7.7)
Platelets: 203 10*3/uL (ref 150–400)
RBC: 5.13 MIL/uL (ref 4.22–5.81)
RDW: 14 % (ref 11.5–15.5)
WBC: 17.5 10*3/uL — AB (ref 4.0–10.5)

## 2016-03-10 LAB — I-STAT TROPONIN, ED: Troponin i, poc: 0.02 ng/mL (ref 0.00–0.08)

## 2016-03-10 MED ORDER — LEVOFLOXACIN 750 MG PO TABS
750.0000 mg | ORAL_TABLET | Freq: Every day | ORAL | Status: DC
Start: 2016-03-10 — End: 2016-04-01

## 2016-03-10 MED ORDER — IOPAMIDOL (ISOVUE-300) INJECTION 61%
INTRAVENOUS | Status: AC
  Administered 2016-03-10: 75 mL
  Filled 2016-03-10: qty 75

## 2016-03-10 MED ORDER — LEVOFLOXACIN 750 MG PO TABS
750.0000 mg | ORAL_TABLET | Freq: Once | ORAL | Status: AC
  Administered 2016-03-10: 750 mg via ORAL
  Filled 2016-03-10: qty 1

## 2016-03-10 NOTE — ED Notes (Signed)
Patient back in room

## 2016-03-10 NOTE — ED Notes (Signed)
Pt sts increased SOB x 6 days and dizziness upon standing; pt on blood thinners

## 2016-03-10 NOTE — ED Provider Notes (Signed)
CSN: [id number]     Arrival date & time 03/10/16  1450 History   First MD Initiated Contact with Patient 03/10/16 1611     Chief Complaint  Patient presents with  . Shortness of Breath  . Dizziness   (Consider location/radiation/quality/duration/timing/severity/associated sxs/prior Treatment) Patient is a 50 y.o. male presenting with shortness of breath and dizziness. The history is provided by the patient and medical records. No language interpreter was used.  Shortness of Breath Associated symptoms: fever (99)   Associated symptoms: no abdominal pain, no chest pain, no cough, no headaches, no vomiting and no wheezing   Dizziness Associated symptoms: palpitations and shortness of breath   Associated symptoms: no chest pain, no headaches, no nausea and no vomiting    Mark Choi is a 50 y.o. male  with a significant cardiac PMH including aortic valve replacement on coumadin and CABG who presents to the Emergency Department complaining of worsening shortness of breath x 6 days. Patient states sob is worse with exertion. Typical physical activity very much limited due to new sob. Associated symptoms include dizziness described as feeling lightheaded/weak which has been constant x 5 days. Tried to work out and felt palpitations - immediately quit exercising. Fever of 99 at home (98.6 in triage). Denies chest pain, n/v, diaphoresis, abdominal pain.    Past Medical History  Diagnosis Date  . Hodgkin's disease ([place])     AGE 72 IN REMISSION  . Hyperlipidemia   . MDD (major depressive disorder) ([place])     STABLE  . GERD (gastroesophageal reflux disease)   . Diabetes mellitus without complication ([place])   . Coronary artery disease   . Allergic rhinitis   . 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 Hodgkin disease ([place])   . Major depression ([place])   . Tachycardia   . HX: long term anticoagulant use   . Migraine   . Impacted cerumen   . Vitamin D deficiency   . Thrombophilia [facility])    Past  Surgical History  Procedure Laterality Date  . Coronary artery bypass graft    . Heart stent      3 STENTS  . Angioplasty      5  . Artificial aortic valve      ST JUDES  . Aortic heart valve    . Cardiac surgery    . Cardiac stents     Family History  Problem Relation Age of Onset  . Breast cancer Mother   . Alcohol abuse Mother   . Diabetes type II Father   . Diverticulitis Father   . Alcoholism Brother   . Depression Brother    Social History  Substance Use Topics  . Smoking status: Never Smoker   . Smokeless tobacco: None  . Alcohol Use: Yes     Comment: SOCAILLY    Review of Systems  Constitutional: Positive for fever (99).  HENT: Negative for congestion.   Eyes: Negative for visual disturbance.  Respiratory: Positive for shortness of breath. Negative for cough and wheezing.   Cardiovascular: Positive for palpitations. Negative for chest pain and leg swelling.  Gastrointestinal: Negative for nausea, vomiting and abdominal pain.  Genitourinary: Negative for dysuria.  Musculoskeletal: Negative for back pain.  Skin: Negative for wound.  Neurological: Positive for dizziness. Negative for headaches.      Allergies  Review of patient's allergies indicates no known allergies.  Home Medications   Prior to Admission medications   Medication Sig Start Date End Date Taking? Authorizing Provider  aspirin 81 MG tablet Take 81 mg by mouth daily.   Yes Historical Provider, MD  buPROPion (WELLBUTRIN XL) 300 MG 24 hr tablet Take 300 mg by mouth daily.   Yes Historical Provider, MD  Canagliflozin-Metformin HCl (INVOKAMET) 50-1000 MG TABS Take by mouth.   Yes Historical Provider, MD  losartan (COZAAR) 25 MG tablet Take 25 mg by mouth daily.   Yes Historical Provider, MD  metoprolol tartrate (LOPRESSOR) 25 MG tablet Take 25 mg by mouth daily.    Yes Historical Provider, MD  Multiple Vitamins-Minerals (MENS MULTI VITAMIN & MINERAL) TABS Take 1 tablet by mouth daily.   Yes  Historical Provider, MD  nitroGLYCERIN (NITROSTAT) 0.4 MG SL tablet Place 1 tablet (0.4 mg total) under the tongue every 5 (five) minutes as needed for chest pain. 10/21/15  Yes Thayer Headings, MD  omeprazole (PRILOSEC) 20 MG capsule Take 20 mg by mouth daily.   Yes Historical Provider, MD  rosuvastatin (CRESTOR) 40 MG tablet Take 40 mg by mouth daily.   Yes Historical Provider, MD  Testosterone 12.5 MG/ACT (1%) GEL Apply 12.5 mg topically daily. 10/19/14  Yes Historical Provider, MD  warfarin (COUMADIN) 2 MG tablet Take 2 mg by mouth once a week. Combine with 3mg  tablet to equal a combined 28m on fridays only.   Yes Historical Provider, MD  warfarin (COUMADIN) 3 MG tablet Take 1 tablet (3 mg total) by mouth as directed. Take 3 mg by mouth as directed by [facility]madin Clinic 03/09/16  Yes Thayer Headings, MD  levofloxacin (LEVAQUIN) 750 MG tablet Take 1 tablet (750 mg total) by mouth daily. 03/10/16   Ozella Almond Ward, PA-C  prochlorperazine (COMPAZINE) 10 MG tablet Take 1 tablet (10 mg total) by mouth 2 (two) times daily as needed for nausea or vomiting (Nausea ). 09/07/14   Cleatrice Burke, PA-C   BP 123/89 mmHg  Pulse 100  Temp(Src) 98.1 F (36.7 C) (Oral)  Resp 18  SpO2 95% Physical Exam  Constitutional: He is oriented to person, place, and time. He appears well-developed and well-nourished.  Alert and in no acute distress  HENT:  Head: Normocephalic and atraumatic.  Cardiovascular: Intact distal pulses.   No murmur heard. Tachy but regular.   Pulmonary/Chest: Effort normal. No respiratory distress. He has no wheezes. He has no rales. He exhibits no tenderness.  Speaking in full sentences, 100% on RA. Lungs are clear but breath sounds diminished on right.   Abdominal: Soft. Bowel sounds are normal. He exhibits no distension. There is no tenderness.  Musculoskeletal: He exhibits no edema.  Neurological: He is alert and oriented to person, place, and time.  Skin: Skin is warm and dry.  Nursing  note and vitals reviewed.   ED Course  Procedures (including critical care time) Labs Review Labs Reviewed  CBC WITH DIFFERENTIAL/PLATELET - Abnormal; Notable for the following:    WBC 17.5 (*)    Neutro Abs 12.9 (*)    Monocytes Absolute 1.1 (*)    All other components within normal limits  COMPREHENSIVE METABOLIC PANEL - Abnormal; Notable for the following:    Glucose, Bld 126 (*)    Total Bilirubin 1.3 (*)    All other components within normal limits  CULTURE, BLOOD (ROUTINE X 2)  CULTURE, BLOOD (ROUTINE X 2)  I-STAT TROPOININ, ED    Imaging Review Dg Chest 2 View  03/10/2016  CLINICAL DATA:  Shortness of Breath EXAM: CHEST  2 VIEW COMPARISON:  None. FINDINGS: There is a small  right pleural effusion. There is no edema or consolidation. Heart size and pulmonary vascularity are normal. No adenopathy. Patient is status post aortic valve replacement as well as coronary artery bypass grafting. Postoperative changes noted in the right upper hemithorax region. No bone lesions evident. IMPRESSION: Small right pleural effusion. No edema or consolidation. Postoperative changes noted. Electronically Signed   By: Lowella Grip III M.D.   On: 03/10/2016 15:24   Ct Chest W Contrast  03/10/2016  CLINICAL DATA:  Shortness of breath and lightheadedness for 5 days, EXAM: CT CHEST WITH CONTRAST TECHNIQUE: Multidetector CT imaging of the chest was performed during intravenous contrast administration. CONTRAST:  1 ISOVUE-300 IOPAMIDOL (ISOVUE-300) INJECTION 61% COMPARISON:  03/10/2016 FINDINGS: There is mild bilateral symmetric upper lobe and lower lobe bronchiectasis. Mild multifocal ground-glass attenuation left lower lobe. 8 mm pulmonary nodule in the lingula. On the right, there is partially calcified mild pleural thickening suggesting prior empyema or hemothorax. There is a complex low-attenuation right thyroid nodule measuring 28 mm. Thoracic aorta shows no dissection or dilatation. There is  diffuse aortic calcification. There is an aortic valve replacement. There are numerous mediastinal calcifications likely representing postinflammatory lymph nodes. There is no pericardial effusion. There are numerous small gallstones. The gallbladder is not visualized in its entirety. There are no significant abnormalities visualized portion of the upper abdomen. There are no acute musculoskeletal findings. The patient is status post median sternotomy. IMPRESSION: Bilateral bronchiectasis cyst with mosaic ground-glass left lower lobe attenuation likely reflecting small airways obstructive disease. Postsurgical changes on the right including partially calcified pleural thickening. Right thyroid nodule for which thyroid ultrasound is recommended. 8 mm left pulmonary nodule. Non-contrast chest CT at 6-12 months is recommended. If the nodule is stable at time of repeat CT, then future CT at 18-24 months (from today's scan) is considered optional for low-risk patients, but is recommended for high-risk patients. This recommendation follows the consensus statement: Guidelines for Management of Incidental Pulmonary Nodules Detected on CT Images:From the Fleischner Society 2017; published online before print (10.1148/radiol.IJ:2314499). Electronically Signed   By: Skipper Cliche M.D.   On: 03/10/2016 19:55   I have personally reviewed and evaluated these images and lab results as part of my medical decision-making.   EKG Interpretation None      MDM   Final diagnoses:  Shortness of breath   Shourya Perrot presents with shortness of breath x 6 days.   Labs: CBC with white count of 17.5, CMP reassuring, trop negative. Imaging: Chest x-ray shows right pleural effusion-CT chest was obtained which shows postsurgical changes on the right including partially calcified pleural thickening. Pulmonary nodule also seen. Bilateral bronchiectasis cyst with mosaic groundglass left lower lobe attenuation. Patient's case  discussed with Dr. Gilford Raid who recommends outpatient treatment with antibiotics and close to the follow-up. Plan discussed with patient and wife at bedside who agree with the plan. Patient closely followed by PCP and state he can easily schedule follow up. Strict return precautions given. Rx for levaquin - informed of warfarin interactions. All questions answered.  Patient discussed with Dr. Gilford Raid who agrees with treatment plan.     [facility] Ward, PA-C 03/10/16 2139  Isla Pence, MD 03/10/16 [phone]

## 2016-03-10 NOTE — Discharge Instructions (Signed)
It was my pleasure taking care of you today!  Please take all of your antibiotics until finished! Antibiotics typically react with Coumadin, please be aware that your Coumadin levels may change and get them checked regularly. Contact your primary care physician tomorrow morning to schedule a follow-up appointment. Return to the ER for difficulty breathing, fever, new or worsening symptoms, any additional concerns.

## 2016-03-15 LAB — CULTURE, BLOOD (ROUTINE X 2)
CULTURE: NO GROWTH
Culture: NO GROWTH

## 2016-03-16 ENCOUNTER — Ambulatory Visit (INDEPENDENT_AMBULATORY_CARE_PROVIDER_SITE_OTHER): Payer: 59 | Admitting: Pharmacist

## 2016-03-16 DIAGNOSIS — Z5181 Encounter for therapeutic drug level monitoring: Secondary | ICD-10-CM | POA: Diagnosis not present

## 2016-03-16 DIAGNOSIS — Z954 Presence of other heart-valve replacement: Secondary | ICD-10-CM | POA: Diagnosis not present

## 2016-03-16 LAB — POCT INR: INR: 2.6

## 2016-03-17 ENCOUNTER — Other Ambulatory Visit: Payer: Self-pay | Admitting: Family Medicine

## 2016-03-17 ENCOUNTER — Ambulatory Visit
Admission: RE | Admit: 2016-03-17 | Discharge: 2016-03-17 | Disposition: A | Discharge status: Home / Self Care | Payer: 59 | Source: Ambulatory Visit | Attending: Family Medicine | Admitting: Family Medicine

## 2016-03-17 DIAGNOSIS — J189 Pneumonia, unspecified organism: Secondary | ICD-10-CM

## 2016-03-30 ENCOUNTER — Ambulatory Visit (INDEPENDENT_AMBULATORY_CARE_PROVIDER_SITE_OTHER): Payer: 59

## 2016-03-30 DIAGNOSIS — Z5181 Encounter for therapeutic drug level monitoring: Secondary | ICD-10-CM

## 2016-03-30 DIAGNOSIS — Z954 Presence of other heart-valve replacement: Secondary | ICD-10-CM

## 2016-03-30 LAB — POCT INR: INR: 3.6

## 2016-04-01 ENCOUNTER — Encounter: Payer: Self-pay | Admitting: Internal Medicine

## 2016-04-01 ENCOUNTER — Telehealth: Payer: Self-pay | Admitting: Internal Medicine

## 2016-04-01 ENCOUNTER — Ambulatory Visit (INDEPENDENT_AMBULATORY_CARE_PROVIDER_SITE_OTHER): Payer: 59 | Admitting: Internal Medicine

## 2016-04-01 VITALS — BP 106/68 | HR 82 | Ht 70.0 in | Wt 192.4 lb

## 2016-04-01 DIAGNOSIS — R0602 Shortness of breath: Secondary | ICD-10-CM | POA: Insufficient documentation

## 2016-04-01 DIAGNOSIS — R0600 Dyspnea, unspecified: Secondary | ICD-10-CM | POA: Diagnosis not present

## 2016-04-01 MED ORDER — PANTOPRAZOLE SODIUM 40 MG PO TBEC: 40.0000 mg | DELAYED_RELEASE_TABLET | Freq: Every day | ORAL | Status: DC

## 2016-04-01 MED ORDER — FAMOTIDINE 20 MG PO TABS: ORAL_TABLET | ORAL | Status: DC

## 2016-04-01 NOTE — Progress Notes (Signed)
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  Note has h/o endocarditis but "this doesn't feel like that"    04/01/2016 1st [facility] office visit/ Wert   Chief Complaint  Patient presents with  . Pulmonary Consult    Referred by Dr. Rachell Cipro. Pt c/o SOB since 03/10/16- occurs with or without any exertion. He states he gets winded walking from room to room.  He also c/o non prod cough.   onset was insidious , pattern is min progressive with sob at rst and room to room assoc with daytime not noct dry cough and mild hoarseness/ h/o hb on omeprazole 20 mg daily   No obvious patterns in day to day or daytime variability or assoc excess/ purulent sputum or mucus plugs or hemoptysis or cp or chest tightness, subjective wheeze or overt sinus or hb symptoms. No unusual exp hx or h/o childhood pna/ asthma or knowledge of premature birth.  Sleeping ok without nocturnal  or early am exacerbation  of respiratory  c/o's or need for noct saba. Also denies any obvious fluctuation of symptoms with weather or environmental changes or other aggravating or alleviating factors except as outlined above   Current Medications, Allergies, Complete Past Medical History, Past Surgical History, Family History, and Social History were reviewed in [employer] record.  ROS  The following are not active complaints unless bolded sore throat, dysphagia, dental problems, itching, sneezing,  nasal congestion or excess/ purulent secretions, ear ache,   fever,  chills, sweats, unintended wt loss, classically pleuritic or exertional cp,  orthopnea pnd or leg swelling, presyncope, palpitations, abdominal pain, anorexia, nausea, vomiting, diarrhea  or change in bowel or bladder habits, change in stools or urine, dysuria,hematuria,  rash, arthralgias, visual complaints, headache, numbness, weakness or ataxia or problems with walking or coordination,  change in mood/affect or memory.        Review of Systems     Objective:   Physical Exam  pleasant amb wm nad  Wt Readings from Last 3 Encounters:  04/01/16 192 lb 6.4 oz (87.272 kg)  10/21/15 191 lb 12.8 oz (87 kg)  04/01/15 195 lb 12.8 oz (88.814 kg)    Vital signs reviewed    HEENT: nl dentition, turbinates, and oropharynx. Nl external ear canals without cough reflex   NECK :  without JVD/Nodes/TM/ nl carotid upstrokes bilaterally   LUNGS: no acc muscle use,  Nl contour chest which is clear to A and P bilaterally without cough on insp or exp maneuvers   CV:  RRR  no s3 or murmur or increase in P2, no edema   ABD:  soft and nontender with nl inspiratory excursion in the supine position. No bruits or organomegaly, bowel sounds nl  MS:  Nl gait/ ext warm without deformities, calf tenderness, cyanosis or clubbing No obvious joint restrictions   SKIN: warm and dry without lesions    NEURO:  alert, approp, nl sensorium with  no motor deficits  I personally reviewed images and agree with radiology impression as follows:  CXR:  03/17/16  No significant change in the appearance of the chest since the previous study. Persistent pleural thickening and/or pleural effusion on the right is demonstrated. No discrete alveolar pneumonia and no pulmonary edema.    I personally reviewed images and agree with radiology impression as follows:  CT Chest   With contrast 03/10/16  Bilateral bronchiectasis cyst with mosaic ground-glass left lower lobe attenuation likely reflecting small airways  obstructive disease. Postsurgical changes on the right including partially calcified pleural thickening. Right thyroid nodule for which thyroid ultrasound is recommended. 8 mm left pulmonary nodule.   Labs rec 04/01/2016 : cbc/bnp/tsh/esr/bmet     Assessment:

## 2016-04-01 NOTE — Telephone Encounter (Signed)
LM x 1 

## 2016-04-01 NOTE — Patient Instructions (Addendum)
Protonix 40 mg Take 30-60 min before first meal of the day and Pepcid   (famotidine) 20 mg one @  bedtime until return  GERD (REFLUX)  is an extremely common cause of respiratory symptoms just like yours , many times with no obvious heartburn at all.    It can be treated with medication, but also with lifestyle changes including elevation of the head of your bed (ideally with 6 inch  bed blocks),  Smoking cessation, avoidance of late meals, excessive alcohol, and avoid fatty foods, chocolate, peppermint, colas, red wine, and acidic juices such as orange juice.  NO MINT OR MENTHOL PRODUCTS SO NO COUGH DROPS  USE SUGARLESS CANDY INSTEAD (Jolley ranchers or Stover's or Life Savers) or even ice chips will also do - the key is to swallow to prevent all throat clearing. NO OIL BASED VITAMINS - use powdered substitutes.  Please schedule a follow up office visit in 2 weeks, sooner if needed

## 2016-04-02 ENCOUNTER — Encounter: Payer: Self-pay | Admitting: Internal Medicine

## 2016-04-02 NOTE — Assessment & Plan Note (Signed)
04/01/2016  Walked RA x 3 laps @ 185 ft each stopped due to  End of study, nl pace, no sob or desat  Not able to reproduce this "daily" sensation of sob here today and overall Symptoms are  disproportionate to any objective findings and not clear this is all due to a lung problem but pt does appear to have difficult airway management issues.   DDX of  difficult airways management almost all start with A and  include Adherence, Ace Inhibitors, Acid Reflux, Active Sinus Disease, Alpha 1 Antitripsin deficiency, Anxiety masquerading as Airways dz,  ABPA,  Allergy(esp in young), Aspiration (esp in elderly), Adverse effects of meds,  Active smokers, A bunch of PE's (a small clot burden can't cause this syndrome unless there is already severe underlying pulm or vascular dz with poor reserve) plus two Bs  = Bronchiectasis and Beta blocker use..and one C= CHF  Adherence is always the initial "prime suspect" and is a multilayered concern that requires a "trust but verify" approach in every patient - starting with knowing how to use medications, especially inhalers, correctly, keeping up with refills and understanding the fundamental difference between maintenance and prns vs those medications only taken for a very short course and then stopped and not refilled.  - very complex med rx  ? Acid (or non-acid) GERD > always difficult to exclude as up to 75% of pts in some series report no assoc GI/ Heartburn symptoms> rec max (24h)  acid suppression and diet restrictions/ reviewed and instructions given in writing.   ? A bunch of pe's > not likely on coumadin   ? Anxiety > dx of exclusion   ? A bunch of PE's > unlikely on therapeutic coumandin  ? Bronchiectasis > present on cxr though this hx is not c/w bronchiectasis driving any of his symptoms  ? CHF / valvular ht dz > unlikely s orthopnea/ leg swellling > check bnp   Will see again in 2 weeks and check labs in meantime  Total time devoted to counseling  =  35/52m review case with pt/ discussion of options/alternatives/ personally creating written instructions  in presence of pt  then going over those specific  Instructions directly with the pt including how to use all of the meds but in particular covering each new medication in detail and the difference between the maintenance/automatic meds and the prns using an action plan format for the latter.

## 2016-04-02 NOTE — Telephone Encounter (Signed)
The intention was for him to have this done yesterday, not sure why he did not go, but the orders are in and per MW- needs to come in ASAP to get this done [facility]

## 2016-04-05 NOTE — Telephone Encounter (Signed)
Attempted to contact patient, left message for patient to return call.

## 2016-04-06 NOTE — Telephone Encounter (Signed)
Attempted to contact patient, Left message for patient to call back.  

## 2016-04-06 NOTE — Telephone Encounter (Signed)
Pt aware stated he will come in tomorrow

## 2016-04-07 ENCOUNTER — Ambulatory Visit (INDEPENDENT_AMBULATORY_CARE_PROVIDER_SITE_OTHER)
Admission: RE | Admit: 2016-04-07 | Discharge: 2016-04-07 | Disposition: A | Discharge status: Home / Self Care | Payer: 59 | Source: Ambulatory Visit | Attending: Internal Medicine | Admitting: Internal Medicine

## 2016-04-07 ENCOUNTER — Other Ambulatory Visit (INDEPENDENT_AMBULATORY_CARE_PROVIDER_SITE_OTHER): Payer: 59

## 2016-04-07 DIAGNOSIS — R0600 Dyspnea, unspecified: Secondary | ICD-10-CM | POA: Diagnosis not present

## 2016-04-07 LAB — SEDIMENTATION RATE: SED RATE: 1 mm/h (ref 0–20)

## 2016-04-07 LAB — CBC WITH DIFFERENTIAL/PLATELET
BASOS PCT: 0.4 % (ref 0.0–3.0)
Basophils Absolute: 0 10*3/uL (ref 0.0–0.1)
EOS PCT: 2.9 % (ref 0.0–5.0)
Eosinophils Absolute: 0.3 10*3/uL (ref 0.0–0.7)
HCT: 41.8 % (ref 39.0–52.0)
HEMOGLOBIN: 13.9 g/dL (ref 13.0–17.0)
LYMPHS ABS: 3.7 10*3/uL (ref 0.7–4.0)
Lymphocytes Relative: 34.9 % (ref 12.0–46.0)
MCHC: 33.1 g/dL (ref 30.0–36.0)
MCV: 84.1 fl (ref 78.0–100.0)
MONO ABS: 1 10*3/uL (ref 0.1–1.0)
MONOS PCT: 9.1 % (ref 3.0–12.0)
NEUTROS PCT: 52.7 % (ref 43.0–77.0)
Neutro Abs: 5.6 10*3/uL (ref 1.4–7.7)
Platelets: 190 10*3/uL (ref 150.0–400.0)
RBC: 4.97 Mil/uL (ref 4.22–5.81)
RDW: 14.5 % (ref 11.5–15.5)
WBC: 10.6 10*3/uL — ABNORMAL HIGH (ref 4.0–10.5)

## 2016-04-07 LAB — BASIC METABOLIC PANEL
BUN: 20 mg/dL (ref 6–23)
CHLORIDE: 105 meq/L (ref 96–112)
CO2: 26 mEq/L (ref 19–32)
Calcium: 9.5 mg/dL (ref 8.4–10.5)
Creatinine, Ser: 0.86 mg/dL (ref 0.40–1.50)
GFR: 99.52 mL/min (ref >60.00)
GLUCOSE: 164 mg/dL — AB (ref 70–99)
POTASSIUM: 4.4 meq/L (ref 3.5–5.1)
Sodium: 138 mEq/L (ref 135–145)

## 2016-04-07 LAB — BRAIN NATRIURETIC PEPTIDE: PRO B NATRI PEPTIDE: 54 pg/mL (ref 0.0–100.0)

## 2016-04-07 LAB — TSH: TSH: 3.54 u[IU]/mL (ref 0.35–4.50)

## 2016-04-07 NOTE — Progress Notes (Signed)
Quick Note:  LMTCB ______ 

## 2016-04-08 NOTE — Progress Notes (Signed)
Quick Note:  Spoke with pt and notified of results per Dr. Wert. Pt verbalized understanding and denied any questions.  ______ 

## 2016-04-16 ENCOUNTER — Ambulatory Visit: Payer: 59 | Admitting: Internal Medicine

## 2016-04-19 ENCOUNTER — Encounter: Payer: Self-pay | Admitting: Nurse Practitioner

## 2016-04-19 ENCOUNTER — Other Ambulatory Visit: Payer: Self-pay

## 2016-04-19 ENCOUNTER — Ambulatory Visit (INDEPENDENT_AMBULATORY_CARE_PROVIDER_SITE_OTHER): Payer: 59 | Admitting: Internal Medicine

## 2016-04-19 ENCOUNTER — Encounter: Payer: Self-pay | Admitting: Internal Medicine

## 2016-04-19 ENCOUNTER — Ambulatory Visit (HOSPITAL_COMMUNITY): Payer: 59 | Attending: Cardiology

## 2016-04-19 ENCOUNTER — Ambulatory Visit (INDEPENDENT_AMBULATORY_CARE_PROVIDER_SITE_OTHER): Payer: 59 | Admitting: Cardiovascular Disease

## 2016-04-19 ENCOUNTER — Encounter: Payer: Self-pay | Admitting: Cardiovascular Disease

## 2016-04-19 VITALS — BP 110/72 | HR 74 | Ht 71.0 in | Wt 193.4 lb

## 2016-04-19 VITALS — BP 120/80 | HR 89 | Ht 71.0 in | Wt 194.0 lb

## 2016-04-19 DIAGNOSIS — R0600 Dyspnea, unspecified: Secondary | ICD-10-CM

## 2016-04-19 DIAGNOSIS — R05 Cough: Secondary | ICD-10-CM | POA: Diagnosis not present

## 2016-04-19 DIAGNOSIS — Z952 Presence of prosthetic heart valve: Secondary | ICD-10-CM | POA: Insufficient documentation

## 2016-04-19 DIAGNOSIS — E785 Hyperlipidemia, unspecified: Secondary | ICD-10-CM | POA: Diagnosis not present

## 2016-04-19 DIAGNOSIS — Z951 Presence of aortocoronary bypass graft: Secondary | ICD-10-CM | POA: Diagnosis not present

## 2016-04-19 DIAGNOSIS — I2583 Coronary atherosclerosis due to lipid rich plaque: Secondary | ICD-10-CM

## 2016-04-19 DIAGNOSIS — E119 Type 2 diabetes mellitus without complications: Secondary | ICD-10-CM | POA: Diagnosis not present

## 2016-04-19 DIAGNOSIS — I350 Nonrheumatic aortic (valve) stenosis: Secondary | ICD-10-CM | POA: Diagnosis not present

## 2016-04-19 DIAGNOSIS — I2510 Atherosclerotic heart disease of native coronary artery without angina pectoris: Secondary | ICD-10-CM

## 2016-04-19 DIAGNOSIS — R058 Other specified cough: Secondary | ICD-10-CM

## 2016-04-19 DIAGNOSIS — I059 Rheumatic mitral valve disease, unspecified: Secondary | ICD-10-CM | POA: Diagnosis not present

## 2016-04-19 LAB — CBC WITH DIFFERENTIAL/PLATELET
BASOS ABS: 0 {cells}/uL (ref 0–200)
Basophils Relative: 0 %
EOS ABS: 330 {cells}/uL (ref 15–500)
Eosinophils Relative: 3 %
HEMATOCRIT: 39.5 % (ref 38.5–50.0)
HEMOGLOBIN: 13.1 g/dL — AB (ref 13.2–17.1)
LYMPHS ABS: 3520 {cells}/uL (ref 850–3900)
Lymphocytes Relative: 32 %
MCH: 27.9 pg (ref 27.0–33.0)
MCHC: 33.2 g/dL (ref 32.0–36.0)
MCV: 84.2 fL (ref 80.0–100.0)
MONO ABS: 770 {cells}/uL (ref 200–950)
MPV: 10.8 fL (ref 7.5–12.5)
Monocytes Relative: 7 %
NEUTROS ABS: 6380 {cells}/uL (ref 1500–7800)
Neutrophils Relative %: 58 %
Platelets: 192 10*3/uL (ref 140–400)
RBC: 4.69 MIL/uL (ref 4.20–5.80)
RDW: 14.8 % (ref 11.0–15.0)
WBC: 11 10*3/uL — ABNORMAL HIGH (ref 3.8–10.8)

## 2016-04-19 LAB — BASIC METABOLIC PANEL
BUN: 18 mg/dL (ref 7–25)
CALCIUM: 9 mg/dL (ref 8.6–10.3)
CHLORIDE: 105 mmol/L (ref 98–110)
CO2: 25 mmol/L (ref 20–31)
CREATININE: 1.05 mg/dL (ref 0.70–1.33)
GLUCOSE: 118 mg/dL — AB (ref 65–99)
Potassium: 4.5 mmol/L (ref 3.5–5.3)
Sodium: 140 mmol/L (ref 135–146)

## 2016-04-19 LAB — ECHOCARDIOGRAM COMPLETE
Height: 71 in
Weight: 3094.4 oz

## 2016-04-19 LAB — PROTIME-INR
INR: 2.9 — ABNORMAL HIGH
PROTHROMBIN TIME: 29.1 s — AB (ref 9.0–11.5)

## 2016-04-19 MED ORDER — FUROSEMIDE 40 MG PO TABS: 40.0000 mg | ORAL_TABLET | Freq: Every day | ORAL | Status: DC

## 2016-04-19 MED ORDER — POTASSIUM CHLORIDE ER 10 MEQ PO TBCR: 10.0000 meq | EXTENDED_RELEASE_TABLET | Freq: Every day | ORAL | Status: DC

## 2016-04-19 NOTE — Progress Notes (Signed)
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  Note has h/o endocarditis but "this doesn't feel like that"    History of Present Illness  04/01/2016 1st [facility] office visit/ Wert   Chief Complaint  Patient presents with  . Pulmonary Consult    Referred by Dr. Rachell Cipro. Pt c/o SOB since 03/10/16- occurs with or without any exertion. He states he gets winded walking from room to room.  He also c/o non prod cough.   onset was insidious , pattern is min progressive with sob at rest and room to room assoc with daytime not noct dry cough and mild hoarseness/ h/o hb on omeprazole 20 mg daily  rec Protonix 40 mg Take 30-60 min before first meal of the day and Pepcid   (famotidine) 20 mg one @  bedtime until return GERD diet/lifestyle instructions     04/19/2016  f/u ov/Wert re: unexplained doe onset May 2017 and stopped doing dreadmill  Chief Complaint  Patient presents with  . Follow-up    Cough has improved, but his breathing is unchanged.   doe  [place] = can walk nl pace, flat grade, can't hurry or go uphills or steps s sob   But outdoor mailbox back to house slt incline has to sop at least once since late May lived there since 2015  Was doing treadmill x 30 min 3.2 mph and 1% prior to onset > planning LHC/RHC next  per cards as next step    No obvious patterns in day to day or daytime variability or assoc excess/ purulent sputum or mucus plugs or hemoptysis or cp or chest tightness, subjective wheeze or overt sinus  or hb symptoms. No unusual exp hx or h/o childhood pna/ asthma or knowledge of premature birth.  Sleeping ok without nocturnal  or early am exacerbation  of respiratory  c/o's or need for noct saba. Also denies any obvious fluctuation of symptoms with weather or environmental changes or other aggravating or alleviating factors except as outlined above   Current Medications, Allergies, Complete Past Medical History, Past Surgical History, Family History, and Social History were reviewed in [employer] record.  ROS  The following are not active complaints unless bolded sore throat, dysphagia, dental problems, itching, sneezing,  nasal congestion or excess/ purulent secretions, ear ache,   fever, chills, sweats, unintended wt loss, classically pleuritic or exertional cp,  orthopnea pnd or leg swelling, presyncope, palpitations, abdominal pain, anorexia, nausea, vomiting, diarrhea  or change in bowel or bladder habits, change in stools or urine, dysuria,hematuria,  rash, arthralgias, visual complaints, headache, numbness, weakness or ataxia or problems with walking or coordination,  change in mood/affect or memory.              Objective:   Physical Exam  pleasant amb wm nad  04/19/2016        194   04/01/16 192 lb 6.4 oz (87.272  kg)  10/21/15 191 lb 12.8 oz (87 kg)  04/01/15 195 lb 12.8 oz (88.814 kg)    Vital signs reviewed    HEENT: nl dentition, turbinates, and oropharynx. Nl external ear canals without cough reflex   NECK :  without JVD/Nodes/TM/ nl carotid upstrokes bilaterally   LUNGS: no acc muscle use,  Nl contour chest which is clear to A and P bilaterally without cough on insp or exp maneuvers   CV:  RRR  no s3  III/VI sem  mechanical s2  or increase in P2, no edema   ABD:  soft and nontender with nl inspiratory excursion in the supine position. No bruits or organomegaly, bowel sounds nl  MS:  Nl gait/ ext warm without deformities, calf tenderness,  cyanosis or clubbing No obvious joint restrictions   SKIN: warm and dry without lesions    NEURO:  alert, approp, nl sensorium with  no motor deficits      I personally reviewed images and agree with radiology impression as follows:  CXR:  03/17/16  No significant change in the appearance of the chest since the previous study. Persistent pleural thickening and/or pleural effusion on the right is demonstrated. No discrete alveolar pneumonia and no pulmonary edema.    I personally reviewed images and agree with radiology impression as follows:  CT Chest   With contrast 03/10/16  Bilateral bronchiectasis  with mosaic ground-glass left lower lobe attenuation likely reflecting small airways obstructive disease. Postsurgical changes on the right including partially calcified pleural thickening. Right thyroid nodule for which thyroid ultrasound is recommended. 8 mm left pulmonary nodule.     Labs ordered/ reviewed:     Chemistry      Component Value Date/Time   NA 138 04/07/2016 1407   K 4.4 04/07/2016 1407   CL 105 04/07/2016 1407   CO2 26 04/07/2016 1407   BUN 20 04/07/2016 1407   CREATININE 0.86 04/07/2016 1407   CREATININE 0.95 10/16/2015 0859      Component Value Date/Time   CALCIUM 9.5 04/07/2016 1407   ALKPHOS 55 03/10/2016 1459   AST 27 03/10/2016 1459   ALT 30 03/10/2016 1459   BILITOT 1.3* 03/10/2016 1459        Lab Results  Component Value Date   WBC 10.6* 04/07/2016   HGB 13.9 04/07/2016   HCT 41.8 04/07/2016   MCV 84.1 04/07/2016   PLT 190.0 04/07/2016        Lab Results  Component Value Date   TSH 3.54 04/07/2016     Lab Results  Component Value Date   PROBNP 54.0 04/07/2016       Lab Results  Component Value Date   ESRSEDRATE 1 04/07/2016   ESRSEDRATE 1 11/27/2014       Assessment:

## 2016-04-19 NOTE — Progress Notes (Signed)
Cardiology Office Note   Date:  04/19/2016   ID:  Mark Choi, DOB 04-02-1966, MRN [id number]  PCP:  Rachell Cipro, MD  Cardiologist:   Mertie Moores, MD   Chief Complaint  Patient presents with  . Follow-up    SOB   Problem List: 1. Status post aortic valve placement - 2010  2. Diabetes mellitus 3. Hyperlipidemia 4. Coronary artery disease -  CABG in 1998 , multiple stents following CABG  5. Hodgkin's disease - s/p mantal radiation, age 50.  6. 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 male who presents for follow up for his AVR.   Since I saw him several months ago, he has developed symptoms  He has had dyspnea.  With any sort of exertion.  Associated with lightheadedness.  Resolves after several months.  Has occasional dyspnea even with sitting   Not related to a URI that he had a week ago. Also has some tingling in his arms with exertion. Rare cp Has been going on for 1 1/2 months.  Progressive worsening.  These symptoms feel similar to his symptoms that he had prior to his CABG.    Feb. 17, 2016:  Mr. Sarabia had his echo yesterday and was  Found to have a vegetation on the aortic valve.  The vegetation is a filamentous strand like growth.  He has known endocarditis in 2014 and was treated at another hospital.  He has not had any symptoms of endocarditis.    Mark Choi is doing very well.  Not sick in any way. No fevers, rash,  No blood in urine  Has had migraine headaches.  Doubt that's related.   December 17, 2014:  Mark Choi is seen for follow up. He was found have a vegetation on echocardiogram. He has a known history of endocarditis in the past. His blood cultures are negative. His sedimentation rate is 1. He continues to have symptoms of chest discomfort. He's not having any fevers or chills. Has exertional chest pain .  Any exertion causes moderate dyspnea.  Does not take NTG ( causes him to have  head aches)   April 01, 2015:   Having more orthostatic hypotension.   Also having trouble keeping  INR theraputic.   CP{ has not worsened.  HR is in the upper normal range.  He has tried more metoprolol but his BP got too low   Jan. 10, 2017:  Mark Choi is seen for follow up for his history of endocarditis and coronary artery disease. He has continued to have episodes of angina. I've reviewed the Myoview study from last year. I've also reviewed the echocardiogram.  The angina seems to be limiting his exertion.   Does not want to take NTG because of the associated headache.  April 19, 2016:  Mark Choi is having exertional dyspnea.   No angina .   Starting to limit his activities.  Will be seeing pulmonary this afternoon.   Past Medical History  Diagnosis Date  . Hodgkin's disease ([place])     AGE 50 IN REMISSION  . Hyperlipidemia   . MDD (major depressive disorder) ([place])     STABLE  . GERD (gastroesophageal reflux disease)   . Diabetes mellitus without complication ([place])   . Coronary artery disease   . Allergic rhinitis   . 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 Hodgkin disease ([place])   . Major depression ([place])   .  Tachycardia   . HX: long term anticoagulant use   . Migraine   . Impacted cerumen   . Vitamin D deficiency   . Thrombophilia [facility])     Past Surgical History  Procedure Laterality Date  . Coronary artery bypass graft    . Heart stent      3 STENTS  . Angioplasty      5  . Artificial aortic valve      [facility]  . Aortic heart valve    . Cardiac surgery    . Cardiac stents    . Thoracotomy  2011     Current Outpatient Prescriptions  Medication Sig Dispense Refill  . aspirin 81 MG tablet Take 81 mg by mouth daily.    Marland Kitchen buPROPion (WELLBUTRIN XL) 300 MG 24 hr tablet Take 300 mg by mouth daily.    . Canagliflozin-Metformin HCl (INVOKAMET) 50-1000 MG TABS Take 1 tablet by mouth daily.     . famotidine (PEPCID) 20 MG tablet Take 20 mg by mouth daily.    Marland Kitchen losartan  (COZAAR) 25 MG tablet Take 25 mg by mouth daily.    . metoprolol tartrate (LOPRESSOR) 25 MG tablet Take 25 mg by mouth daily.     . Multiple Vitamins-Minerals (MENS MULTI VITAMIN & MINERAL) TABS Take 1 tablet by mouth daily.    . nitroGLYCERIN (NITROSTAT) 0.4 MG SL tablet Place 1 tablet (0.4 mg total) under the tongue every 5 (five) minutes as needed for chest pain. 25 tablet 6  . ONETOUCH VERIO test strip 1 strip by Does not apply route as directed.  11  . pantoprazole (PROTONIX) 40 MG tablet Take 1 tablet (40 mg total) by mouth daily. Take 30-60 min before first meal of the day 30 tablet 2  . prochlorperazine (COMPAZINE) 10 MG tablet Take 1 tablet (10 mg total) by mouth 2 (two) times daily as needed for nausea or vomiting (Nausea ). 10 tablet 0  . rosuvastatin (CRESTOR) 40 MG tablet Take 40 mg by mouth daily.    . Testosterone 12.5 MG/ACT (1%) GEL Apply 12.5 mg topically daily.  1  . warfarin (COUMADIN) 2 MG tablet Take 2 mg by mouth once a week. Combine with 3mg  tablet to equal a combined 52m on fridays only.    . warfarin (COUMADIN) 3 MG tablet Take 1 tablet (3 mg total) by mouth as directed. Take 3 mg by mouth as directed by [facility] 120 tablet 1   No current facility-administered medications for this visit.    Allergies:   Review of patient's allergies indicates no known allergies.    Social History:  The patient  reports that he has never smoked. He has never used smokeless tobacco. He reports that he drinks alcohol. He reports that he does not use illicit drugs.   Family History:  The patient's family history includes Alcohol abuse in his mother; Alcoholism in his brother; Breast cancer in his mother; Depression in his brother; Diabetes type II in his father; Diverticulitis in his father.    ROS:  Please see the history of present illness.    Review of Systems: Constitutional:  denies fever, chills, diaphoresis, appetite change and fatigue.  HEENT: denies photophobia, eye  pain, redness, hearing loss, ear pain, congestion, sore throat, rhinorrhea, sneezing, neck pain, neck stiffness and tinnitus.  Respiratory: admits to SOB, DOE,  Cardiovascular: denies chest pain, palpitations and leg swelling.  Gastrointestinal: denies nausea, vomiting, abdominal pain, diarrhea, constipation, blood in stool.  Genitourinary:  denies dysuria, urgency, frequency, hematuria, flank pain and difficulty urinating.  Musculoskeletal: denies  myalgias, back pain, joint swelling, arthralgias and gait problem.   Skin: denies pallor, rash and wound.  Neurological: denies dizziness, seizures, syncope, weakness, light-headedness, numbness and headaches.   Hematological: denies adenopathy, easy bruising, personal or family bleeding history.  Psychiatric/ Behavioral: denies suicidal ideation, mood changes, confusion, nervousness, sleep disturbance and agitation.       All other systems are reviewed and negative.    PHYSICAL EXAM: VS:  BP 110/72 mmHg  Pulse 74  Ht 5\' 11"  (1.803 m)  Wt 193 lb 6.4 oz (87.726 kg)  BMI 26.99 kg/m2  SpO2 95% , BMI Body mass index is 26.99 kg/(m^2). GEN: Well nourished, well developed, in no acute distress HEENT: normal Neck: no JVD, carotid bruits, or masses Cardiac: RRR; mechanical S2. no murmurs, rubs, or gallops,no edema  Respiratory:  clear to auscultation bilaterally, normal work of breathing GI: soft, nontender, nondistended, + BS MS: no deformity or atrophy Skin: warm and dry, no rash Neuro:  Strength and sensation are intact Psych: normal   EKG:  EKG is   ordered today. NSR at 88. 1st degree AV block , right axis deviation    Recent Labs: 03/10/2016: ALT 30 04/07/2016: BUN 20; Creatinine, Ser 0.86; Hemoglobin 13.9; Platelets 190.0; Potassium 4.4; Pro B Natriuretic peptide (BNP) 54.0; Sodium 138; TSH 3.54    Lipid Panel    Component Value Date/Time   CHOL 97* 10/16/2015 0859   TRIG 208* 10/16/2015 0859   HDL 27* 10/16/2015 0859    CHOLHDL 3.6 10/16/2015 0859   VLDL 42* 10/16/2015 0859   LDLCALC 28 10/16/2015 0859      Wt Readings from Last 3 Encounters:  04/19/16 193 lb 6.4 oz (87.726 kg)  04/01/16 192 lb 6.4 oz (87.272 kg)  10/21/15 191 lb 12.8 oz (87 kg)      Other studies Reviewed: Additional studies/ records that were reviewed today include: . Review of the above records demonstrates:    ASSESSMENT AND PLAN:  Problem List: 1. Status post aortic valve placement - 2010  -  His INR levels have been quite variable. We will refer him to our Coumadin clinic to follow his INR levels.  2. Diabetes mellitus - followed by his general medical doctor  3. Hyperlipidemia -  Will will check fasting lipids, liver enzymes, and basic metabolic profile when I see him again in 6 month.  4. Coronary artery disease -  CABG 1998, [place] has had multiple stent procedures following his bypass surgery.  Is not having any significant angina but is definitely limited by DOE - progressive  Will get an echo - will need to look for constrictive pericarditis  ( Will set up for a Right and left heart cath   5. Hodgkin's disease - s/p mantal radiation, age 27.   6. Hx of endocarditis in 2014 -  he seems very stable. We found a long filamentous vegetation on his aortic valve. His workup included blood cultures and sedimentation rate which were normal. He has no symptoms. We will continue to monitor. He will need to take SBE prophylaxis prior to any dental procedures.   Current medicines are reviewed at length with the patient today.  The patient does not have concerns regarding medicines.  The following changes have been made:  no change   Disposition:   FU with me in 6 months      Signed, Mertie Moores, MD  04/19/2016 8:38  AM    [facility] [place], [place], [place]  [address] Phone: [phone]; Fax: [phone]

## 2016-04-19 NOTE — Patient Instructions (Signed)
Get back on treadmill and start walking up to 30 min daily after your heart cath unless otherwise instructed by cardiology  No change in medications for now  If we don't have an answer to your breathing problems after heart cath, a cpst is next - call me to schedule for you

## 2016-04-19 NOTE — Addendum Note (Signed)
Addended by: Emmaline Life on: 04/19/2016 04:53 PM   Modules accepted: Orders

## 2016-04-19 NOTE — Patient Instructions (Addendum)
Medication Instructions:  Your physician recommends that you continue on your current medications as directed. Please refer to the Current Medication list given to you today.   Labwork: TODAY - PT/INR, CBC, Basic metabolic panel   Testing/Procedures: Your physician has requested that you have an echocardiogram TODAY at 11:00 am. Echocardiography is a painless test that uses sound waves to create images of your heart. It provides your doctor with information about the size and shape of your heart and how well your heart's chambers and valves are working. This procedure takes approximately one hour. There are no restrictions for this procedure.  Your physician has requested that you have a cardiac catheterization on Monday July 17. Cardiac catheterization is used to diagnose and/or treat various heart conditions. Doctors may recommend this procedure for a number of different reasons. The most common reason is to evaluate chest pain. Chest pain can be a symptom of coronary artery disease (CAD), and cardiac catheterization can show whether plaque is narrowing or blocking your heart's arteries. This procedure is also used to evaluate the valves, as well as measure the blood flow and oxygen levels in different parts of your heart. For further information please visit [email or web address]. Please follow instruction sheet, as given.   Follow-Up: Your physician recommends that you schedule a follow-up appointment in: 6 weeks on August 28 at 3:45 pm  If you need a refill on your cardiac medications before your next appointment, please call your pharmacy.   Thank you for choosing [facility]! Christen Bame, RN [phone]

## 2016-04-24 DIAGNOSIS — R058 Other specified cough: Secondary | ICD-10-CM | POA: Insufficient documentation

## 2016-04-24 DIAGNOSIS — R05 Cough: Secondary | ICD-10-CM | POA: Insufficient documentation

## 2016-04-24 NOTE — Assessment & Plan Note (Signed)
04/01/2016  Walked RA x 3 laps @ 185 ft each stopped due to  End of study, nl pace, no sob or desat    - Spirometry 04/19/2016  FEV1 2.03 (50%)  Ratio 84   He has restrictive changes s/p RT but no obst to go along with the bronchiectasis or "mosaic" patter suggesting some obst component potentially contributing to his symptoms and plan is to do RHC/LHC 04/26/16 then consider cpst if no obvious explanation but after cleared by cards should get back on the treadmill regularly before attempting cpst  I had an extended discussion with the patient reviewing all relevant studies completed to date and  lasting 15 to 20 minutes of a 25 minute visit    Each maintenance medication was reviewed in detail including most importantly the difference between maintenance and prns and under what circumstances the prns are to be triggered using an action plan format that is not reflected in the computer generated alphabetically organized AVS.    Please see instructions for details which were reviewed in writing and the patient given a copy highlighting the part that I personally wrote and discussed at today's ov.

## 2016-04-24 NOTE — Assessment & Plan Note (Signed)
Improved on rx for GERD > rec continue for now until sort out the cause of his doe then regroup re longterm rx

## 2016-04-26 ENCOUNTER — Encounter (HOSPITAL_COMMUNITY)
Admission: RE | Disposition: A | Discharge status: Home / Self Care | Payer: Self-pay | Source: Ambulatory Visit | Attending: Cardiovascular Disease

## 2016-04-26 ENCOUNTER — Ambulatory Visit (HOSPITAL_COMMUNITY)
Admission: RE | Admit: 2016-04-26 | Discharge: 2016-04-26 | Disposition: A | Discharge status: Home / Self Care | Payer: 59 | Source: Ambulatory Visit | Attending: Cardiovascular Disease | Admitting: Cardiovascular Disease

## 2016-04-26 ENCOUNTER — Encounter (HOSPITAL_COMMUNITY): Payer: Self-pay | Admitting: Cardiovascular Disease

## 2016-04-26 DIAGNOSIS — K219 Gastro-esophageal reflux disease without esophagitis: Secondary | ICD-10-CM | POA: Insufficient documentation

## 2016-04-26 DIAGNOSIS — Z7982 Long term (current) use of aspirin: Secondary | ICD-10-CM | POA: Diagnosis not present

## 2016-04-26 DIAGNOSIS — E119 Type 2 diabetes mellitus without complications: Secondary | ICD-10-CM | POA: Insufficient documentation

## 2016-04-26 DIAGNOSIS — Z951 Presence of aortocoronary bypass graft: Secondary | ICD-10-CM | POA: Diagnosis not present

## 2016-04-26 DIAGNOSIS — G43909 Migraine, unspecified, not intractable, without status migrainosus: Secondary | ICD-10-CM | POA: Insufficient documentation

## 2016-04-26 DIAGNOSIS — T82855A Stenosis of coronary artery stent, initial encounter: Secondary | ICD-10-CM | POA: Insufficient documentation

## 2016-04-26 DIAGNOSIS — I2584 Coronary atherosclerosis due to calcified coronary lesion: Secondary | ICD-10-CM | POA: Insufficient documentation

## 2016-04-26 DIAGNOSIS — R0609 Other forms of dyspnea: Secondary | ICD-10-CM | POA: Diagnosis present

## 2016-04-26 DIAGNOSIS — F329 Major depressive disorder, single episode, unspecified: Secondary | ICD-10-CM | POA: Diagnosis not present

## 2016-04-26 DIAGNOSIS — D6859 Other primary thrombophilia: Secondary | ICD-10-CM | POA: Insufficient documentation

## 2016-04-26 DIAGNOSIS — Z8571 Personal history of Hodgkin lymphoma: Secondary | ICD-10-CM | POA: Diagnosis not present

## 2016-04-26 DIAGNOSIS — Y712 Prosthetic and other implants, materials and accessory cardiovascular devices associated with adverse incidents: Secondary | ICD-10-CM | POA: Diagnosis not present

## 2016-04-26 DIAGNOSIS — E785 Hyperlipidemia, unspecified: Secondary | ICD-10-CM | POA: Insufficient documentation

## 2016-04-26 DIAGNOSIS — J309 Allergic rhinitis, unspecified: Secondary | ICD-10-CM | POA: Diagnosis not present

## 2016-04-26 DIAGNOSIS — I2510 Atherosclerotic heart disease of native coronary artery without angina pectoris: Secondary | ICD-10-CM

## 2016-04-26 DIAGNOSIS — Z923 Personal history of irradiation: Secondary | ICD-10-CM | POA: Diagnosis not present

## 2016-04-26 DIAGNOSIS — E559 Vitamin D deficiency, unspecified: Secondary | ICD-10-CM | POA: Diagnosis not present

## 2016-04-26 DIAGNOSIS — Z7901 Long term (current) use of anticoagulants: Secondary | ICD-10-CM | POA: Diagnosis not present

## 2016-04-26 DIAGNOSIS — R0602 Shortness of breath: Secondary | ICD-10-CM | POA: Diagnosis present

## 2016-04-26 HISTORY — PX: CARDIAC CATHETERIZATION: SHX172

## 2016-04-26 LAB — POCT I-STAT 3, ART BLOOD GAS (G3+)
Acid-base deficit: 2 mmol/L (ref 0.0–2.0)
BICARBONATE: 23.4 meq/L (ref 20.0–24.0)
O2 Saturation: 95 %
PH ART: 7.356 (ref 7.350–7.450)
PO2 ART: 78 mmHg — AB (ref 80.0–100.0)
TCO2: 25 mmol/L (ref 0–100)
pCO2 arterial: 41.9 mmHg (ref 35.0–45.0)

## 2016-04-26 LAB — POCT I-STAT 3, VENOUS BLOOD GAS (G3P V)
ACID-BASE DEFICIT: 1 mmol/L (ref 0.0–2.0)
Bicarbonate: 25.4 mEq/L — ABNORMAL HIGH (ref 20.0–24.0)
Bicarbonate: 26 mEq/L — ABNORMAL HIGH (ref 20.0–24.0)
O2 SAT: 66 %
O2 Saturation: 64 %
PH VEN: 7.36 — AB (ref 7.250–7.300)
TCO2: 27 mmol/L (ref 0–100)
TCO2: 27 mmol/L (ref 0–100)
pCO2, Ven: 46 mmHg (ref 45.0–50.0)
pCO2, Ven: 47.7 mmHg (ref 45.0–50.0)
pH, Ven: 7.335 — ABNORMAL HIGH (ref 7.250–7.300)
pO2, Ven: 35 mmHg (ref 31.0–45.0)
pO2, Ven: 37 mmHg (ref 31.0–45.0)

## 2016-04-26 LAB — PROTIME-INR
INR: 1.13 (ref 0.00–1.49)
PROTHROMBIN TIME: 14.7 s (ref 11.6–15.2)

## 2016-04-26 LAB — GLUCOSE, CAPILLARY: Glucose-Capillary: 142 mg/dL — ABNORMAL HIGH (ref 65–99)

## 2016-04-26 SURGERY — RIGHT/LEFT HEART CATH AND CORONARY/GRAFT ANGIOGRAPHY

## 2016-04-26 MED ORDER — ACETAMINOPHEN 325 MG PO TABS: 650.0000 mg | ORAL_TABLET | ORAL | Status: DC | PRN

## 2016-04-26 MED ORDER — ONDANSETRON HCL 4 MG/2ML IJ SOLN: 4.0000 mg | Freq: Four times a day (QID) | INTRAMUSCULAR | Status: DC | PRN

## 2016-04-26 MED ORDER — IOPAMIDOL (ISOVUE-370) INJECTION 76%
INTRAVENOUS | Status: AC
  Filled 2016-04-26: qty 125

## 2016-04-26 MED ORDER — ASPIRIN 81 MG PO CHEW
81.0000 mg | CHEWABLE_TABLET | ORAL | Status: AC
  Administered 2016-04-26: 81 mg via ORAL

## 2016-04-26 MED ORDER — MIDAZOLAM HCL 2 MG/2ML IJ SOLN
INTRAMUSCULAR | Status: AC
  Filled 2016-04-26: qty 2

## 2016-04-26 MED ORDER — SODIUM CHLORIDE 0.9 % IV SOLN: INTRAVENOUS | Status: DC

## 2016-04-26 MED ORDER — HEPARIN (PORCINE) IN NACL 2-0.9 UNIT/ML-% IJ SOLN
INTRAMUSCULAR | Status: DC | PRN
  Administered 2016-04-26: 1500 mL

## 2016-04-26 MED ORDER — SODIUM CHLORIDE 0.9 % IV SOLN: 250.0000 mL | INTRAVENOUS | Status: DC | PRN

## 2016-04-26 MED ORDER — ASPIRIN 81 MG PO CHEW
CHEWABLE_TABLET | ORAL | Status: AC
  Administered 2016-04-26: 81 mg via ORAL
  Filled 2016-04-26: qty 1

## 2016-04-26 MED ORDER — SODIUM CHLORIDE 0.9% FLUSH: 3.0000 mL | Freq: Two times a day (BID) | INTRAVENOUS | Status: DC

## 2016-04-26 MED ORDER — SODIUM CHLORIDE 0.9% FLUSH: 3.0000 mL | INTRAVENOUS | Status: DC | PRN

## 2016-04-26 MED ORDER — SODIUM CHLORIDE 0.9 % WEIGHT BASED INFUSION: 1.0000 mL/kg/h | INTRAVENOUS | Status: DC

## 2016-04-26 MED ORDER — IOPAMIDOL (ISOVUE-370) INJECTION 76%
INTRAVENOUS | Status: DC | PRN
  Administered 2016-04-26: 45 mL via INTRA_ARTERIAL

## 2016-04-26 MED ORDER — HEPARIN SODIUM (PORCINE) 1000 UNIT/ML IJ SOLN
INTRAMUSCULAR | Status: DC | PRN
  Administered 2016-04-26: 4000 [IU] via INTRAVENOUS

## 2016-04-26 MED ORDER — MIDAZOLAM HCL 2 MG/2ML IJ SOLN
INTRAMUSCULAR | Status: DC | PRN
  Administered 2016-04-26: 1 mg via INTRAVENOUS

## 2016-04-26 MED ORDER — LIDOCAINE HCL (PF) 1 % IJ SOLN
INTRAMUSCULAR | Status: AC
  Filled 2016-04-26: qty 30

## 2016-04-26 MED ORDER — HEPARIN (PORCINE) IN NACL 2-0.9 UNIT/ML-% IJ SOLN
INTRAMUSCULAR | Status: DC | PRN
  Administered 2016-04-26: 10 mL via INTRA_ARTERIAL

## 2016-04-26 MED ORDER — HEPARIN (PORCINE) IN NACL 2-0.9 UNIT/ML-% IJ SOLN
INTRAMUSCULAR | Status: AC
  Filled 2016-04-26: qty 1000

## 2016-04-26 MED ORDER — FENTANYL CITRATE (PF) 100 MCG/2ML IJ SOLN
INTRAMUSCULAR | Status: AC
  Filled 2016-04-26: qty 2

## 2016-04-26 MED ORDER — LIDOCAINE HCL (PF) 1 % IJ SOLN
INTRAMUSCULAR | Status: DC | PRN
  Administered 2016-04-26 (×2): 2 mL via SUBCUTANEOUS

## 2016-04-26 MED ORDER — HEPARIN SODIUM (PORCINE) 1000 UNIT/ML IJ SOLN
INTRAMUSCULAR | Status: AC
Start: 2016-04-26 — End: 2016-04-26
  Filled 2016-04-26: qty 1

## 2016-04-26 MED ORDER — VERAPAMIL HCL 2.5 MG/ML IV SOLN
INTRAVENOUS | Status: AC
  Filled 2016-04-26: qty 2

## 2016-04-26 MED ORDER — FENTANYL CITRATE (PF) 100 MCG/2ML IJ SOLN
INTRAMUSCULAR | Status: DC | PRN
  Administered 2016-04-26: 25 ug via INTRAVENOUS

## 2016-04-26 MED ORDER — SODIUM CHLORIDE 0.9 % WEIGHT BASED INFUSION
3.0000 mL/kg/h | INTRAVENOUS | Status: AC
  Administered 2016-04-26: 3 mL/kg/h via INTRAVENOUS

## 2016-04-26 SURGICAL SUPPLY — 13 items
CATH BALLN WEDGE 5F 110CM (CATHETERS) ×2 IMPLANT
CATH INFINITI 5 FR JL3.5 (CATHETERS) ×2 IMPLANT
CATH INFINITI 5FR JL4 (CATHETERS) ×2 IMPLANT
CATH INFINITI JR4 5F (CATHETERS) ×2 IMPLANT
DEVICE RAD COMP TR BAND LRG (VASCULAR PRODUCTS) ×2 IMPLANT
GLIDESHEATH SLEND SS 6F .021 (SHEATH) ×4 IMPLANT
GUIDEWIRE .025 260CM (WIRE) ×2 IMPLANT
KIT HEART LEFT (KITS) ×2 IMPLANT
PACK CARDIAC CATHETERIZATION (CUSTOM PROCEDURE TRAY) ×2 IMPLANT
SHEATH FAST CATH BRACH 5F 5CM (SHEATH) ×2 IMPLANT
TRANSDUCER W/STOPCOCK (MISCELLANEOUS) ×2 IMPLANT
TUBING CIL FLEX 10 FLL-RA (TUBING) ×2 IMPLANT
WIRE SAFE-T 1.5MM-J .035X260CM (WIRE) ×2 IMPLANT

## 2016-04-26 NOTE — Interval H&P Note (Signed)
Cath Lab Visit (complete for each Cath Lab visit)  Clinical Evaluation Leading to the Procedure:   ACS: No.  Non-ACS:    Anginal Classification: CCS II  Anti-ischemic medical therapy: Minimal Therapy (1 class of medications)  Non-Invasive Test Results: No non-invasive testing performed  Prior CABG: Previous CABG   Echo images reviewed. Dr Elmarie Shiley notes reviewed. Plan right heart cath, coronary and graft angiography, possible PCI. Pt consented to [facility] trial (on long-term anticoagulation with mechanical aortic prosthesis).  History and Physical Interval Note:  04/26/2016 7:57 AM  Jerilee Field  has presented today for surgery, with the diagnosis of disynia  The various methods of treatment have been discussed with the patient and family. After consideration of risks, benefits and other options for treatment, the patient has consented to  Procedure(s): Right/Left Heart Cath and Coronary/Graft Angiography (N/A) as a surgical intervention .  The patient's history has been reviewed, patient examined, no change in status, stable for surgery.  I have reviewed the patient's chart and labs.  Questions were answered to the patient's satisfaction.     Sherren Mocha

## 2016-04-26 NOTE — H&P (View-Only) (Signed)
Cardiology Office Note   Date:  04/19/2016   ID:  Mark Choi, DOB 30-Aug-1966, MRN Mark Choi  PCP:  Mark Cipro, MD  Cardiologist:   Mark Moores, MD   Chief Complaint  Patient presents with  . Follow-up    SOB   Problem List: 1. Status post aortic valve placement - 2010  2. Diabetes mellitus 3. Hyperlipidemia 4. Coronary artery disease -  CABG in 1998 , multiple stents following CABG  5. Hodgkin's disease - s/p mantal radiation, age 50.  6. 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 male who presents for follow up for his AVR.   Since I saw him several months ago, he has developed symptoms  He has had dyspnea.  With any sort of exertion.  Associated with lightheadedness.  Resolves after several months.  Has occasional dyspnea even with sitting   Not related to a URI that he had a week ago. Also has some tingling in his arms with exertion. Rare cp Has been going on for 1 1/2 months.  Progressive worsening.  These symptoms feel similar to his symptoms that he had prior to his CABG.    Feb. 17, 2016:  Mark Choi had his echo yesterday and was  Found to have a vegetation on the aortic valve.  The vegetation is a filamentous strand like growth.  He has known endocarditis in 2014 and was treated at another [facility].  He has not had any symptoms of endocarditis.    Mark Choi is doing very well.  Not sick in any way. No fevers, rash,  No blood in urine  Has had migraine headaches.  Doubt that's related.   December 17, 2014:  Mark Choi is seen for follow up. He was found have a vegetation on echocardiogram. He has a known history of endocarditis in the past. His blood cultures are negative. His sedimentation rate is 1. He continues to have symptoms of chest discomfort. He's not having any fevers or chills. Has exertional chest pain .  Any exertion causes moderate dyspnea.  Does not take NTG ( causes him to have  head aches)   April 01, 2015:   Having more orthostatic hypotension.   Also having trouble keeping  INR theraputic.   CP{ has not worsened.  HR is in the upper normal range.  He has tried more metoprolol but his BP got too low   Jan. 10, 2017:  Mark Choi is seen for follow up for his history of endocarditis and coronary artery disease. He has continued to have episodes of angina. I've reviewed the Myoview study from last year. I've also reviewed the echocardiogram.  The angina seems to be limiting his exertion.   Does not want to take NTG because of the associated headache.  April 19, 2016:  Mark Choi is having exertional dyspnea.   No angina .   Starting to limit his activities.  Will be seeing pulmonary this afternoon.   Past Medical History  Diagnosis Date  . Hodgkin's disease (Mark Choi)     AGE 44 IN REMISSION  . Hyperlipidemia   . Mark (major depressive disorder) (Mark Choi)     STABLE  . Mark (gastroesophageal reflux disease)   . Diabetes mellitus without complication (Mark Choi)   . Coronary artery disease   . Allergic rhinitis   . Anterior pituitary disease (Mark Choi)   . Hodgkin disease (Mark Choi)   . Major depression (Mark Choi)   .  Tachycardia   . HX: long term anticoagulant use   . Migraine   . Impacted cerumen   . Vitamin D deficiency   . Thrombophilia Mark Choi)     Past Surgical History  Procedure Laterality Date  . Coronary artery bypass graft    . Heart stent      3 STENTS  . Angioplasty      5  . Artificial aortic valve      [facility]  . Aortic heart valve    . Cardiac surgery    . Cardiac stents    . Thoracotomy  2011     Current Outpatient Prescriptions  Medication Sig Dispense Refill  . aspirin 81 MG tablet Take 81 mg by mouth daily.    Marland Kitchen buPROPion (WELLBUTRIN XL) 300 MG 24 hr tablet Take 300 mg by mouth daily.    . Canagliflozin-Metformin HCl (INVOKAMET) 50-1000 MG TABS Take 1 tablet by mouth daily.     . famotidine (PEPCID) 20 MG tablet Take 20 mg by mouth daily.    Marland Kitchen losartan  (COZAAR) 25 MG tablet Take 25 mg by mouth daily.    . metoprolol tartrate (LOPRESSOR) 25 MG tablet Take 25 mg by mouth daily.     . Multiple Vitamins-Minerals (MENS MULTI VITAMIN & MINERAL) TABS Take 1 tablet by mouth daily.    . nitroGLYCERIN (NITROSTAT) 0.4 MG SL tablet Place 1 tablet (0.4 mg total) under the tongue every 5 (five) minutes as needed for chest pain. 25 tablet 6  . ONETOUCH VERIO test strip 1 strip by Does not apply route as directed.  11  . pantoprazole (PROTONIX) 40 MG tablet Take 1 tablet (40 mg total) by mouth daily. Take 30-60 min before first meal of the day 30 tablet 2  . prochlorperazine (COMPAZINE) 10 MG tablet Take 1 tablet (10 mg total) by mouth 2 (two) times daily as needed for nausea or vomiting (Nausea ). 10 tablet 0  . rosuvastatin (CRESTOR) 40 MG tablet Take 40 mg by mouth daily.    . Testosterone 12.5 MG/ACT (1%) GEL Apply 12.5 mg topically daily.  1  . warfarin (COUMADIN) 2 MG tablet Take 2 mg by mouth once a week. Combine with 3mg  tablet to equal a combined 12m on fridays only.    . warfarin (COUMADIN) 3 MG tablet Take 1 tablet (3 mg total) by mouth as directed. Take 3 mg by mouth as directed by [facility] 120 tablet 1   No current facility-administered medications for this visit.    Allergies:   Review of patient's allergies indicates no known allergies.    Social History:  The patient  reports that he has never smoked. He has never used smokeless tobacco. He reports that he drinks alcohol. He reports that he does not use illicit drugs.   Family History:  The patient's family history includes Alcohol abuse in his mother; Alcoholism in his brother; Breast cancer in his mother; Depression in his brother; Diabetes type II in his father; Diverticulitis in his father.    ROS:  Please see the history of present illness.    Review of Systems: Constitutional:  denies fever, chills, diaphoresis, appetite change and fatigue.  HEENT: denies photophobia, eye  pain, redness, hearing loss, ear pain, congestion, sore throat, rhinorrhea, sneezing, neck pain, neck stiffness and tinnitus.  Respiratory: admits to SOB, DOE,  Cardiovascular: denies chest pain, palpitations and leg swelling.  Gastrointestinal: denies nausea, vomiting, abdominal pain, diarrhea, constipation, blood in stool.  Genitourinary:  denies dysuria, urgency, frequency, hematuria, flank pain and difficulty urinating.  Musculoskeletal: denies  myalgias, back pain, joint swelling, arthralgias and gait problem.   Skin: denies pallor, rash and wound.  Neurological: denies dizziness, seizures, syncope, weakness, light-headedness, numbness and headaches.   Hematological: denies adenopathy, easy bruising, personal or family bleeding history.  Psychiatric/ Behavioral: denies suicidal ideation, mood changes, confusion, nervousness, sleep disturbance and agitation.       All other systems are reviewed and negative.    PHYSICAL EXAM: VS:  BP 110/72 mmHg  Pulse 74  Ht 5\' 11"  (1.803 m)  Wt 193 lb 6.4 oz (87.726 kg)  BMI 26.99 kg/m2  SpO2 95% , BMI Body mass index is 26.99 kg/(m^2). GEN: Well nourished, well developed, in no acute distress HEENT: normal Neck: no JVD, carotid bruits, or masses Cardiac: RRR; mechanical S2. no murmurs, rubs, or gallops,no edema  Respiratory:  clear to auscultation bilaterally, normal work of breathing GI: soft, nontender, nondistended, + BS MS: no deformity or atrophy Skin: warm and dry, no rash Neuro:  Strength and sensation are intact Psych: normal   EKG:  EKG is   ordered today. NSR at 88. 1st degree AV block , right axis deviation    Recent Labs: 03/10/2016: ALT 30 04/07/2016: BUN 20; Creatinine, Ser 0.86; Hemoglobin 13.9; Platelets 190.0; Potassium 4.4; Pro B Natriuretic peptide (BNP) 54.0; Sodium 138; TSH 3.54    Lipid Panel    Component Value Date/Time   CHOL 97* 10/16/2015 0859   TRIG 208* 10/16/2015 0859   HDL 27* 10/16/2015 0859    CHOLHDL 3.6 10/16/2015 0859   VLDL 42* 10/16/2015 0859   LDLCALC 28 10/16/2015 0859      Wt Readings from Last 3 Encounters:  04/19/16 193 lb 6.4 oz (87.726 kg)  04/01/16 192 lb 6.4 oz (87.272 kg)  10/21/15 191 lb 12.8 oz (87 kg)      Other studies Reviewed: Additional studies/ records that were reviewed today include: . Review of the above records demonstrates:    ASSESSMENT AND PLAN:  Problem List: 1. Status post aortic valve placement - 2010  -  His INR levels have been quite variable. We will refer him to our Coumadin clinic to follow his INR levels.  2. Diabetes mellitus - followed by his general medical doctor  3. Hyperlipidemia -  Will will check fasting lipids, liver enzymes, and basic metabolic profile when I see him again in 6 month.  4. Coronary artery disease -  CABG 1998, [place] has had multiple stent procedures following his bypass surgery.  Is not having any significant angina but is definitely limited by DOE - progressive  Will get an echo - will need to look for constrictive pericarditis  ( Will set up for a Right and left heart cath   5. Hodgkin's disease - s/p mantal radiation, age 33.   6. Hx of endocarditis in 2014 -  he seems very stable. We found a long filamentous vegetation on his aortic valve. His workup included blood cultures and sedimentation rate which were normal. He has no symptoms. We will continue to monitor. He will need to take SBE prophylaxis prior to any dental procedures.   Current medicines are reviewed at length with the patient today.  The patient does not have concerns regarding medicines.  The following changes have been made:  no change   Disposition:   FU with me in 6 months      Signed, Mark Moores, MD  04/19/2016 8:38  AM    [facility] [place], [place], [place]  [address] Phone: [phone]; Fax: [phone]

## 2016-04-26 NOTE — Research (Signed)
LEADERS FREE II Informed Consent   Subject Name: Mark Choi  Subject met inclusion and exclusion criteria.  The informed consent form, study requirements and expectations were reviewed with the subject and questions and concerns were addressed prior to the signing of the consent form.  The subject verbalized understanding of the trail requirements.  The subject agreed to participate in the LEADERS FREE II trial and signed the informed consent.  The informed consent was obtained prior to performance of any protocol-specific procedures for the subject.  A copy of the signed informed consent was given to the subject and a copy was placed in the subject's medical record.  Hedrick,Tammy W 04/26/2016, 7:22 AM

## 2016-04-26 NOTE — Discharge Instructions (Signed)
°  NO METFORMIN/GLUCOPHAGE FOR 2 DAYS ° ° ° °Radial Site Care °Refer to this sheet in the next few weeks. These instructions provide you with information about caring for yourself after your procedure. Your health care provider may also give you more specific instructions. Your treatment has been planned according to current medical practices, but problems sometimes occur. Call your health care provider if you have any problems or questions after your procedure. °WHAT TO EXPECT AFTER THE PROCEDURE °After your procedure, it is typical to have the following: °· Bruising at the radial site that usually fades within 1-2 weeks. °· Blood collecting in the tissue (hematoma) that may be painful to the touch. It should usually decrease in size and tenderness within 1-2 weeks. °HOME CARE INSTRUCTIONS °· Take medicines only as directed by your health care provider. °· You may shower 24-48 hours after the procedure or as directed by your health care provider. Remove the bandage (dressing) and gently wash the site with plain soap and water. Pat the area dry with a clean towel. Do not rub the site, because this may cause bleeding. °· Do not take baths, swim, or use a hot tub until your health care provider approves. °· Check your insertion site every day for redness, swelling, or drainage. °· Do not apply powder or lotion to the site. °· Do not flex or bend the affected arm for 24 hours or as directed by your health care provider. °· Do not push or pull heavy objects with the affected arm for 24 hours or as directed by your health care provider. °· Do not lift over 10 lb (4.5 kg) for 5 days after your procedure or as directed by your health care provider. °· Ask your health care provider when it is okay to: °¨ Return to work or school. °¨ Resume usual physical activities or sports. °¨ Resume sexual activity. °· Do not drive home if you are discharged the same day as the procedure. Have someone else drive you. °· You may drive 24  hours after the procedure unless otherwise instructed by your health care provider. °· Do not operate machinery or power tools for 24 hours after the procedure. °· If your procedure was done as an outpatient procedure, which means that you went home the same day as your procedure, a responsible adult should be with you for the first 24 hours after you arrive home. °· Keep all follow-up visits as directed by your health care provider. This is important. °SEEK MEDICAL CARE IF: °· You have a fever. °· You have chills. °· You have increased bleeding from the radial site. Hold pressure on the site. °SEEK IMMEDIATE MEDICAL CARE IF: °· You have unusual pain at the radial site. °· You have redness, warmth, or swelling at the radial site. °· You have drainage (other than a small amount of blood on the dressing) from the radial site. °· The radial site is bleeding, and the bleeding does not stop after 30 minutes of holding steady pressure on the site. °· Your arm or hand becomes pale, cool, tingly, or numb. °  °This information is not intended to replace advice given to you by your health care provider. Make sure you discuss any questions you have with your health care provider. °  °Document Released: 10/30/2010 Document Revised: 10/18/2014 Document Reviewed: 04/15/2014 °Elsevier Interactive Patient Education ©2016 [employer] ° °

## 2016-05-03 ENCOUNTER — Ambulatory Visit (INDEPENDENT_AMBULATORY_CARE_PROVIDER_SITE_OTHER): Payer: 59

## 2016-05-03 DIAGNOSIS — Z5181 Encounter for therapeutic drug level monitoring: Secondary | ICD-10-CM

## 2016-05-03 DIAGNOSIS — Z954 Presence of other heart-valve replacement: Secondary | ICD-10-CM | POA: Diagnosis not present

## 2016-05-03 LAB — POCT INR: INR: 2

## 2016-05-17 ENCOUNTER — Encounter: Payer: Self-pay | Admitting: Internal Medicine

## 2016-05-17 ENCOUNTER — Ambulatory Visit (INDEPENDENT_AMBULATORY_CARE_PROVIDER_SITE_OTHER)
Admission: RE | Admit: 2016-05-17 | Discharge: 2016-05-17 | Disposition: A | Discharge status: Home / Self Care | Payer: 59 | Source: Ambulatory Visit | Attending: Internal Medicine | Admitting: Internal Medicine

## 2016-05-17 ENCOUNTER — Ambulatory Visit (INDEPENDENT_AMBULATORY_CARE_PROVIDER_SITE_OTHER): Payer: 59 | Admitting: Internal Medicine

## 2016-05-17 VITALS — BP 112/68 | HR 96 | Ht 71.0 in | Wt 192.8 lb

## 2016-05-17 DIAGNOSIS — R0600 Dyspnea, unspecified: Secondary | ICD-10-CM

## 2016-05-17 DIAGNOSIS — R058 Other specified cough: Secondary | ICD-10-CM

## 2016-05-17 DIAGNOSIS — R05 Cough: Secondary | ICD-10-CM | POA: Diagnosis not present

## 2016-05-17 NOTE — Progress Notes (Signed)
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  Note has h/o endocarditis but "this doesn't feel like that"    History of Present Illness  04/01/2016 1st [facility] office visit/ Wert   Chief Complaint  Patient presents with  . Pulmonary Consult    Referred by Dr. Rachell Cipro. Pt c/o SOB since 03/10/16- occurs with or without any exertion. He states he gets winded walking from room to room.  He also c/o non prod cough.   onset was insidious , pattern is min progressive with sob at rest and room to room assoc with daytime not noct dry cough and mild hoarseness/ h/o hb on omeprazole 20 mg daily  rec Protonix 40 mg Take 30-60 min before first meal of the day and Pepcid   (famotidine) 20 mg one @  bedtime until return GERD diet/lifestyle instructions     04/19/2016  f/u ov/Wert re: unexplained doe onset May 2017 and stopped doing treadmill  Chief Complaint  Patient presents with  . Follow-up    Cough has improved, but his breathing is unchanged.   doe  Indoors  [facility], [place] = can walk nl pace, flat grade, can't hurry or go uphills or steps s sob   But outdoor mailbox back to house slt incline has to stop at least once since late May lived there since 2015 Was doing treadmill x 30 min 3.2 mph and 1% prior to onset > planning LHC/RHC next  per cards as next step  rec Get back on treadmill and start walking up to 30 min daily after your heart cath unless otherwise instructed by cardiology No change in medications for now  - Spirometry  04/19/2016  FEV1 2.03 (50%)  Ratio 84  - RHC/LHC 04/26/2016  1. Normal right heart hemodynamics 2. Severe left main disease 3. Patent RCA with mild-moderate ostial stenosis 4. S/P CABG with continued patency of the sequential LIMA-diag/LAD and sequential SVG-OM (sequential portion of this graft not visualized)   05/17/2016  f/u ov/Wert re: unexplained sob / maint on ppi and h2 hs  Chief Complaint  Patient presents with  . Follow-up    Pt states his breathing is progressively getting worse.   sob at rest x one week/ still able to get mb and back stopping once ie no change ex tol despite reported sob at rest  No noct symptoms at all/ no cough     No obvious   day to day or daytime variability or assoc excess/ purulent sputum or mucus plugs or hemoptysis or cp or chest tightness, subjective wheeze or overt sinus or hb symptoms. No unusual exp hx or h/o childhood pna/ asthma or knowledge of premature birth.  Sleeping ok without nocturnal  or early am exacerbation  of respiratory  c/o's or need for noct saba. Also denies any obvious fluctuation of symptoms with weather or environmental changes or other aggravating or alleviating factors except as outlined above  Current Medications, Allergies, Complete Past Medical History, Past Surgical History, Family History, and Social History were reviewed in [employer] record.  ROS  The following are not active complaints unless bolded sore throat, dysphagia, dental problems, itching, sneezing,  nasal congestion or excess/ purulent secretions, ear ache,   fever, chills, sweats, unintended wt loss, classically pleuritic or exertional cp,  orthopnea pnd or leg swelling, presyncope, palpitations, abdominal pain, anorexia, nausea, vomiting, diarrhea  or change in bowel or bladder habits, change in stools or urine, dysuria,hematuria,  rash, arthralgias, visual complaints, headache, numbness, weakness or ataxia or problems with walking or  coordination,  change in mood/affect or memory.              Objective:   Physical Exam  pleasant amb wm nad   05/17/2016          193 04/19/2016        194   04/01/16 192 lb 6.4 oz (87.272 kg)  10/21/15 191 lb 12.8 oz (87 kg)  04/01/15 195 lb 12.8 oz (88.814 kg)    Vital signs reviewed    HEENT: nl dentition, turbinates, and oropharynx. Nl external ear canals without cough reflex   NECK :  without JVD/Nodes/TM/ nl carotid upstrokes bilaterally   LUNGS: no acc muscle use,  Nl contour chest which is clear to A and P bilaterally without cough on insp or exp maneuvers   CV:  RRR  no s3  III/VI sem  mechanical S2  or increase in P2, no edema   ABD:  Quit protuberant/ obese but soft and nontender with nl inspiratory excursion in the supine position. No bruits or organomegaly, bowel sounds nl  MS:  Nl gait/ ext warm without deformities, calf tenderness, cyanosis or clubbing No obvious joint restrictions   SKIN: warm and dry without lesions    NEURO:  alert, approp, nl sensorium with  no motor deficits       CXR PA and Lateral:   05/17/2016 :    I personally reviewed images and agree with radiology impression as follows:    Stable chronic change. No active lung disease.  I personally reviewed images and agree with radiology impression as follows:  CT Chest   With contrast 03/10/16  Bilateral bronchiectasis  with mosaic ground-glass left lower lobe attenuation likely reflecting small airways obstructive disease. Postsurgical changes on the right including partially calcified pleural thickening. Right thyroid nodule for which thyroid ultrasound is recommended. 8 mm left pulmonary nodule.     Labs ordered/ reviewed:     Chemistry      Component Value Date/Time   NA 138 04/07/2016 1407   K 4.4 04/07/2016 1407   CL 105 04/07/2016 1407   CO2 26 04/07/2016 1407   BUN 20 04/07/2016 1407   CREATININE 0.86 04/07/2016 1407   CREATININE 0.95 10/16/2015 0859       Component Value Date/Time   CALCIUM 9.5 04/07/2016 1407   ALKPHOS 55 03/10/2016 1459   AST 27 03/10/2016 1459   ALT 30 03/10/2016 1459   BILITOT 1.3* 03/10/2016 1459        Lab Results  Component Value Date   WBC 10.6* 04/07/2016   HGB 13.9 04/07/2016   HCT 41.8 04/07/2016   MCV 84.1 04/07/2016   PLT 190.0 04/07/2016        Lab Results  Component Value Date   TSH 3.54 04/07/2016     Lab Results  Component Value Date   PROBNP 54.0  04/07/2016       Lab Results  Component Value Date   ESRSEDRATE 1 04/07/2016   ESRSEDRATE 1 11/27/2014       Assessment:

## 2016-05-17 NOTE — Patient Instructions (Addendum)
Full pfts at [facility] as soon as possible   Walk as slowly as possible with goal of 30 minutes on a treadmill daily and then try to build up speed   Please remember to go to the  x-ray department downstairs for your tests - we will call you with the results when they are available.  Continue acid suppression for now

## 2016-05-18 ENCOUNTER — Encounter: Payer: Self-pay | Admitting: Internal Medicine

## 2016-05-18 NOTE — Assessment & Plan Note (Signed)
GERD rx 04/01/16 > improved 04/19/2016 so rec 3 months minimal rx   rec continue gerd rx at least thru the cpst eval then ok to stop and see what symptoms if any flare

## 2016-05-18 NOTE — Progress Notes (Signed)
Spoke with pt and notified of results per Dr. Wert. Pt verbalized understanding and denied any questions. 

## 2016-05-18 NOTE — Assessment & Plan Note (Signed)
04/01/2016  Walked RA x 3 laps @ 185 ft each stopped due to  End of study, nl pace, no sob or desat    - Spirometry 04/19/2016  FEV1 2.03 (50%)  Ratio 84  - RHC/LHC 04/26/2016  1. Normal right heart hemodynamics 2. Severe left main disease 3. Patent RCA with mild-moderate ostial stenosis 4. S/P CABG with continued patency of the sequential LIMA-diag/LAD and sequential SVG-OM (sequential portion of this graft not visualized) - 05/17/2016  Walked RA x 3 laps @ 185 ft each stopped due to end of study/ complained sob and light headed/ no desats   Sense of sob is definitely atypical in that despite reported sob at rest at this point his ex tol is not affected strongly suggesting a component of anxiety esp since the symptom is not present sleeping and I note he has been started on antidepressants.  Since no symptoms at all reproduced at slow pace I rec he do slow paced walking, building up to 30 min and if not noting reconditioning benefit then consider cpst to sort this out.     A full pft was also ordered  I had an extended discussion with the patient reviewing all relevant studies completed to date and  lasting 15 to 20 minutes of a 25 minute visit    Each maintenance medication was reviewed in detail including most importantly the difference between maintenance and prns and under what circumstances the prns are to be triggered using an action plan format that is not reflected in the computer generated alphabetically organized AVS.    Please see instructions for details which were reviewed in writing and the patient given a copy highlighting the part that I personally wrote and discussed at today's ov.

## 2016-05-20 ENCOUNTER — Ambulatory Visit (HOSPITAL_COMMUNITY)
Admission: RE | Admit: 2016-05-20 | Discharge: 2016-05-20 | Disposition: A | Discharge status: Home / Self Care | Payer: 59 | Source: Ambulatory Visit | Attending: Internal Medicine | Admitting: Internal Medicine

## 2016-05-20 DIAGNOSIS — R0600 Dyspnea, unspecified: Secondary | ICD-10-CM | POA: Insufficient documentation

## 2016-05-20 LAB — PULMONARY FUNCTION TEST
DL/VA % pred: 129 %
DL/VA: 6.07 ml/min/mmHg/L
DLCO unc % pred: 72 %
DLCO unc: 24.29 ml/min/mmHg
FEF 25-75 Post: 5.08 L/sec
FEF 25-75 Pre: 4.44 L/sec
FEF2575-%CHANGE-POST: 14 %
FEF2575-%PRED-PRE: 126 %
FEF2575-%Pred-Post: 145 %
FEV1-%Change-Post: 4 %
FEV1-%PRED-POST: 64 %
FEV1-%Pred-Pre: 62 %
FEV1-PRE: 2.49 L
FEV1-Post: 2.59 L
FEV1FVC-%CHANGE-POST: 3 %
FEV1FVC-%Pred-Pre: 117 %
FEV6-%CHANGE-POST: 2 %
FEV6-%PRED-POST: 55 %
FEV6-%PRED-PRE: 54 %
FEV6-POST: 2.79 L
FEV6-PRE: 2.72 L
FEV6FVC-%PRED-PRE: 104 %
FEV6FVC-%Pred-Post: 104 %
FVC-%CHANGE-POST: 0 %
FVC-%PRED-PRE: 53 %
FVC-%Pred-Post: 53 %
FVC-POST: 2.79 L
FVC-Pre: 2.76 L
POST FEV1/FVC RATIO: 93 %
PRE FEV6/FVC RATIO: 100 %
Post FEV6/FVC ratio: 100 %
Pre FEV1/FVC ratio: 90 %
RV % pred: 57 %
RV: 1.22 L
TLC % pred: 56 %
TLC: 4.03 L

## 2016-05-20 MED ORDER — ALBUTEROL SULFATE (2.5 MG/3ML) 0.083% IN NEBU
2.5000 mg | INHALATION_SOLUTION | Freq: Once | RESPIRATORY_TRACT | Status: AC
  Administered 2016-05-20: 2.5 mg via RESPIRATORY_TRACT

## 2016-05-24 ENCOUNTER — Ambulatory Visit (INDEPENDENT_AMBULATORY_CARE_PROVIDER_SITE_OTHER): Payer: 59 | Admitting: Pharmacist

## 2016-05-24 DIAGNOSIS — Z5181 Encounter for therapeutic drug level monitoring: Secondary | ICD-10-CM

## 2016-05-24 DIAGNOSIS — Z954 Presence of other heart-valve replacement: Secondary | ICD-10-CM

## 2016-05-24 LAB — POCT INR: INR: 1.9

## 2016-06-04 ENCOUNTER — Telehealth: Payer: Self-pay | Admitting: Internal Medicine

## 2016-06-04 NOTE — Telephone Encounter (Signed)
lmtcb x1 

## 2016-06-07 ENCOUNTER — Ambulatory Visit (INDEPENDENT_AMBULATORY_CARE_PROVIDER_SITE_OTHER): Payer: 59 | Admitting: Cardiovascular Disease

## 2016-06-07 ENCOUNTER — Ambulatory Visit (INDEPENDENT_AMBULATORY_CARE_PROVIDER_SITE_OTHER): Payer: 59

## 2016-06-07 ENCOUNTER — Encounter: Payer: Self-pay | Admitting: Cardiovascular Disease

## 2016-06-07 VITALS — BP 108/80 | HR 80 | Ht 71.0 in | Wt 192.8 lb

## 2016-06-07 DIAGNOSIS — I2510 Atherosclerotic heart disease of native coronary artery without angina pectoris: Secondary | ICD-10-CM

## 2016-06-07 DIAGNOSIS — Z5181 Encounter for therapeutic drug level monitoring: Secondary | ICD-10-CM | POA: Diagnosis not present

## 2016-06-07 DIAGNOSIS — Z954 Presence of other heart-valve replacement: Secondary | ICD-10-CM

## 2016-06-07 DIAGNOSIS — R0600 Dyspnea, unspecified: Secondary | ICD-10-CM

## 2016-06-07 DIAGNOSIS — I2583 Coronary atherosclerosis due to lipid rich plaque: Principal | ICD-10-CM

## 2016-06-07 LAB — POCT INR: INR: 2.8

## 2016-06-07 NOTE — Telephone Encounter (Signed)
[phone] pt calling back

## 2016-06-07 NOTE — Progress Notes (Signed)
Cardiology Office Note   Date:  06/07/2016   ID:  Mark Choi, DOB 1966-07-20, MRN [id number]  PCP:  Rachell Cipro, MD  Cardiologist:   Mertie Moores, MD   Chief Complaint  Patient presents with  . Coronary Artery Disease   Problem List: 1. Status post aortic valve placement - 2010  2. Diabetes mellitus 3. Hyperlipidemia 4. Coronary artery disease -  CABG in 1998 , multiple stents following CABG  5. Hodgkin's disease - s/p mantal radiation, age 50.  6. 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 male who presents for follow up for his AVR.   Since I saw him several months ago, he has developed symptoms  He has had dyspnea.  With any sort of exertion.  Associated with lightheadedness.  Resolves after several months.  Has occasional dyspnea even with sitting   Not related to a URI that he had a week ago. Also has some tingling in his arms with exertion. Rare cp Has been going on for 1 1/2 months.  Progressive worsening.  These symptoms feel similar to his symptoms that he had prior to his CABG.    Feb. 17, 2016:  Mark Choi had his echo yesterday and was  Found to have a vegetation on the aortic valve.  The vegetation is a filamentous strand like growth.  He has known endocarditis in 2014 and was treated at another hospital.  He has not had any symptoms of endocarditis.    Mark Choi is doing very well.  Not sick in any way. No fevers, rash,  No blood in urine  Has had migraine headaches.  Doubt that's related.   December 17, 2014:  Mark Choi is seen for follow up. He was found have a vegetation on echocardiogram. He has a known history of endocarditis in the past. His blood cultures are negative. His sedimentation rate is 1. He continues to have symptoms of chest discomfort. He's not having any fevers or chills. Has exertional chest pain .  Any exertion causes moderate dyspnea.  Does not take NTG ( causes him to  have head aches)   April 01, 2015:   Having more orthostatic hypotension.   Also having trouble keeping  INR theraputic.   CP{ has not worsened.  HR is in the upper normal range.  He has tried more metoprolol but his BP got too low   Jan. 10, 2017:  Mark Choi is seen for follow up for his history of endocarditis and coronary artery disease. He has continued to have episodes of angina. I've reviewed the Myoview study from last year. I've also reviewed the echocardiogram.  The angina seems to be limiting his exertion.   Does not want to take NTG because of the associated headache.  April 19, 2016:  Mark Choi is having exertional dyspnea.   No angina .   Starting to limit his activities.  Will be seeing pulmonary this afternoon.   Aug. 28, 2017 Mark Choi had a right and left heart catheterization in July. He has severe native coronary artery disease but his grafts appear to be fairly patent. His right heart pressures are normal. There is no evidence of constrictive pericarditis.  Still very short of breath .   Has seen pulmonary   He's complaining of neuropathy pain in his feet. He has achiness in his left lower leg.   Past Medical History:  Diagnosis Date  . Allergic rhinitis   .  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 Coronary artery disease   . Diabetes mellitus without complication ([place])   . GERD (gastroesophageal reflux disease)   . Hodgkin disease ([place])   . Hodgkin's disease ([place])    AGE 24 IN REMISSION  . HX: long term anticoagulant use   . Hyperlipidemia   . Impacted cerumen   . Major depression ([place])   . MDD (major depressive disorder) ([place])    STABLE  . Migraine   . Tachycardia   . Thrombophilia ([place])   . Vitamin D deficiency     Past Surgical History:  Procedure Laterality Date  . ANGIOPLASTY     5  . aortic heart valve    . ARTIFICIAL AORTIC VALVE     [facility]  . CARDIAC CATHETERIZATION N/A 04/26/2016   Procedure: Right/Left Heart Cath and Coronary/Graft Angiography;   Surgeon: Sherren Mocha, MD;  Location: [facility];  Service: Cardiovascular;  Laterality: N/A;  . cardiac stents    . CARDIAC SURGERY    . CORONARY ARTERY BYPASS GRAFT    . HEART STENT     3 STENTS  . THORACOTOMY  2011     Current Outpatient Prescriptions  Medication Sig Dispense Refill  . aspirin 81 MG tablet Take 81 mg by mouth daily.    Marland Kitchen buPROPion (WELLBUTRIN XL) 300 MG 24 hr tablet Take 300 mg by mouth daily.    . Canagliflozin-Metformin HCl (INVOKAMET) 50-1000 MG TABS Take 1 tablet by mouth 2 (two) times daily.     Marland Kitchen escitalopram (LEXAPRO) 5 MG tablet Take 1 tablet by mouth daily.    . famotidine (PEPCID) 20 MG tablet Take 20 mg by mouth daily.    . furosemide (LASIX) 40 MG tablet Take 1 tablet (40 mg total) by mouth daily. 30 tablet 11  . losartan (COZAAR) 25 MG tablet Take 25 mg by mouth daily.    . metoprolol tartrate (LOPRESSOR) 25 MG tablet Take 25 mg by mouth daily.     . Multiple Vitamins-Minerals (MENS MULTI VITAMIN & MINERAL) TABS Take 1 tablet by mouth daily.    . nitroGLYCERIN (NITROSTAT) 0.4 MG SL tablet Place 1 tablet (0.4 mg total) under the tongue every 5 (five) minutes as needed for chest pain. 25 tablet 6  . ONETOUCH VERIO test strip 1 strip by Does not apply route as directed.  11  . pantoprazole (PROTONIX) 40 MG tablet Take 1 tablet (40 mg total) by mouth daily. Take 30-60 min before first meal of the day 30 tablet 2  . potassium chloride (K-DUR) 10 MEQ tablet Take 1 tablet (10 mEq total) by mouth daily. 30 tablet 11  . prochlorperazine (COMPAZINE) 10 MG tablet Take 1 tablet (10 mg total) by mouth 2 (two) times daily as needed for nausea or vomiting (Nausea ). 10 tablet 0  . rosuvastatin (CRESTOR) 40 MG tablet Take 40 mg by mouth daily.    . Testosterone 12.5 MG/ACT (1%) GEL Apply 12.5 mg topically daily as needed (for hormone therapy).   1  . warfarin (COUMADIN) 2 MG tablet Take 2 mg by mouth every Friday. Take 2 mg + 3 mg tablet = 5 mg every Friday.  3  mg all other days    . warfarin (COUMADIN) 3 MG tablet Take 1 tablet (3 mg total) by mouth as directed. Take 3 mg by mouth as directed by [facility]mad[facility] Clinic (Patient taking differently: Take 3 mg by mouth as directed. 3 mg every day except  Friday take 5 mg) 120 tablet 1   No current facility-administered medications for this visit.     Allergies:   Review of patient's allergies indicates no known allergies.    Social History:  The patient  reports that he has never smoked. He has never used smokeless tobacco. He reports that he drinks alcohol. He reports that he does not use drugs.   Family History:  The patient's family history includes Alcohol abuse in his mother; Alcoholism in his brother; Breast cancer in his mother; Depression in his brother; Diabetes type II in his father; Diverticulitis in his father.    ROS:  Please see the history of present illness.    Review of Systems: Constitutional:  denies fever, chills, diaphoresis, appetite change and fatigue.  HEENT: denies photophobia, eye pain, redness, hearing loss, ear pain, congestion, sore throat, rhinorrhea, sneezing, neck pain, neck stiffness and tinnitus.  Respiratory: admits to SOB, DOE,  Cardiovascular: denies chest pain, palpitations and leg swelling.  Gastrointestinal: denies nausea, vomiting, abdominal pain, diarrhea, constipation, blood in stool.  Genitourinary: denies dysuria, urgency, frequency, hematuria, flank pain and difficulty urinating.  Musculoskeletal: denies  myalgias, back pain, joint swelling, arthralgias and gait problem.   Skin: denies pallor, rash and wound.  Neurological: denies dizziness, seizures, syncope, weakness, light-headedness, numbness and headaches.   Hematological: denies adenopathy, easy bruising, personal or family bleeding history.  Psychiatric/ Behavioral: denies suicidal ideation, mood changes, confusion, nervousness, sleep disturbance and agitation.       All other systems are reviewed  and negative.    PHYSICAL EXAM: VS:  BP 108/80 (BP Location: Right Arm, Patient Position: Sitting, Cuff Size: Normal)   Pulse 80   Ht 5\' 11"  (1.803 m)   Wt 192 lb 12.8 oz (87.5 kg)   BMI 26.89 kg/m  , BMI Body mass index is 26.89 kg/m. GEN: Well nourished, well developed, in no acute distress  HEENT: normal  Neck: no JVD, carotid bruits, or masses Cardiac: RRR; mechanical S2. no murmurs, rubs, or gallops,no edema  Respiratory:  clear to auscultation bilaterally, normal work of breathing GI: soft, nontender, nondistended, + BS MS: no deformity or atrophy  Skin: warm and dry, no rash Neuro:  Strength and sensation are intact Psych: normal   EKG:  EKG is   ordered today. NSR at 88. 1st degree AV block , right axis deviation    Recent Labs: 03/10/2016: ALT 30 04/07/2016: Pro B Natriuretic peptide (BNP) 54.0; TSH 3.54 04/19/2016: BUN 18; Creat 1.05; Hemoglobin 13.1; Platelets 192; Potassium 4.5; Sodium 140    Lipid Panel    Component Value Date/Time   CHOL 97 (L) 10/16/2015 0859   TRIG 208 (H) 10/16/2015 0859   HDL 27 (L) 10/16/2015 0859   CHOLHDL 3.6 10/16/2015 0859   VLDL 42 (H) 10/16/2015 0859   LDLCALC 28 10/16/2015 0859      Wt Readings from Last 3 Encounters:  06/07/16 192 lb 12.8 oz (87.5 kg)  05/17/16 192 lb 12.8 oz (87.5 kg)  04/26/16 194 lb (88 kg)      Other studies Reviewed: Additional studies/ records that were reviewed today include: . Review of the above records demonstrates:    ASSESSMENT AND PLAN:  Problem List: 1. Status post aortic valve placement - 2010  -  His INR levels have been quite variable. We will refer him to our Coumadin clinic to follow his INR levels.  2. Diabetes mellitus - followed by his general medical doctor  3. Hyperlipidemia -  Will will check fasting lipids, liver enzymes, and basic metabolic profile when I see him again in 6 month.  4. Coronary artery disease -  CABG 1998, Sameul has had multiple stent procedures  following his bypass surgery.  Is not having any significant angina but is definitely limited by DOE - progressive  Will get an echo - will need to look for constrictive pericarditis  ( Will set up for a Right and left heart cath   5. Hodgkin's disease - s/p mantal radiation, age 35.   6. Hx of endocarditis in 2014 -  he seems very stable. We found a long filamentous vegetation on his aortic valve. His workup included blood cultures and sedimentation rate which were normal. He has no symptoms. We will continue to monitor. He will need to take SBE prophylaxis prior to any dental procedures.   Current medicines are reviewed at length with the patient today.  The patient does not have concerns regarding medicines.  The following changes have been made:  no change   Disposition:   FU with me in 6 months      Signed, Mertie Moores, MD  06/07/2016 4:07 PM    [facility] [address], [place], [place]  [address] Phone: [phone]; Fax: [phone]

## 2016-06-07 NOTE — Telephone Encounter (Signed)
Spoke with pt. He is aware of results. Nothing further was needed. 

## 2016-06-07 NOTE — Telephone Encounter (Signed)
Sorry for delay - lung function is better vs previous study with no airflow obst at all so no change in recs from last ov

## 2016-06-07 NOTE — Telephone Encounter (Signed)
Dr Melvyn Novas, please advise on PFT results in EMR from 05/20/16

## 2016-06-07 NOTE — Patient Instructions (Signed)
Medication Instructions:  Your physician recommends that you continue on your current medications as directed. Please refer to the Current Medication list given to you today.   Labwork: None Ordered   Testing/Procedures: None Ordered   Follow-Up: Your physician recommends that you schedule a follow-up appointment in: 3 months with Dr. Nahser   If you need a refill on your cardiac medications before your next appointment, please call your pharmacy.   Thank you for choosing [facility]! Ugonna Keirsey, RN [phone]    

## 2016-06-16 ENCOUNTER — Other Ambulatory Visit: Payer: Self-pay | Admitting: Family Medicine

## 2016-06-16 DIAGNOSIS — R200 Anesthesia of skin: Secondary | ICD-10-CM

## 2016-07-09 ENCOUNTER — Ambulatory Visit (INDEPENDENT_AMBULATORY_CARE_PROVIDER_SITE_OTHER): Payer: 59 | Admitting: Pharmacist

## 2016-07-09 DIAGNOSIS — Z5181 Encounter for therapeutic drug level monitoring: Secondary | ICD-10-CM | POA: Diagnosis not present

## 2016-07-09 DIAGNOSIS — Z954 Presence of other heart-valve replacement: Secondary | ICD-10-CM | POA: Diagnosis not present

## 2016-07-09 LAB — POCT INR: INR: 1.4

## 2016-07-19 ENCOUNTER — Ambulatory Visit (INDEPENDENT_AMBULATORY_CARE_PROVIDER_SITE_OTHER): Payer: 59 | Admitting: *Deleted

## 2016-07-19 DIAGNOSIS — Z954 Presence of other heart-valve replacement: Secondary | ICD-10-CM | POA: Diagnosis not present

## 2016-07-19 DIAGNOSIS — Z5181 Encounter for therapeutic drug level monitoring: Secondary | ICD-10-CM

## 2016-07-19 LAB — POCT INR: INR: 2.3

## 2016-07-23 ENCOUNTER — Ambulatory Visit (INDEPENDENT_AMBULATORY_CARE_PROVIDER_SITE_OTHER): Payer: 59 | Admitting: Neurology

## 2016-07-23 ENCOUNTER — Encounter: Payer: Self-pay | Admitting: Neurology

## 2016-07-23 VITALS — BP 113/71 | HR 74 | Ht 71.0 in | Wt 193.0 lb

## 2016-07-23 DIAGNOSIS — R202 Paresthesia of skin: Secondary | ICD-10-CM | POA: Insufficient documentation

## 2016-07-23 MED ORDER — GABAPENTIN 300 MG PO CAPS: 300.0000 mg | ORAL_CAPSULE | Freq: Every day | ORAL | 3 refills | Status: DC

## 2016-07-23 NOTE — Patient Instructions (Signed)
   We will start gabapentin 300 mg at night for the leg sensations.    Neurontin (gabapentin) may result in drowsiness, ankle swelling, gait instability, or possibly dizziness. Please contact our office if significant side effects occur with this medication.

## 2016-07-23 NOTE — Progress Notes (Signed)
Reason for visit: Paresthesias  Referring physician: Dr. Marlis Edelson Mark Choi is a 50 y.o. male  History of present illness:  Mark Choi is a 50 year old right-handed white male with a history of diabetes, coronary artery disease, and a prior aortic valve replacement. The patient has noted over the last year some paresthesias involving both lower extremities mainly affecting the calf area while trying to get to sleep at night. The patient is having difficulty resting at night because of the dysesthesias. When he gets up in the morning, the sensations will dissipate within one or 2 hours, he does not have problems when he is up and active during the day. He denies any actual numbness of the feet, he denies any burning or stinging sensations of the feet. He has sensory alterations that are equal from one leg to the other. The sensations go from the knee level down to the ankle. He denies low back pain or pain down the legs. He denies issues controlling the bowels or the bladder. He denies weakness of the lower extremities. The has not had any problems with balance or any falls. He denies any symptoms involving the neck, arms, or hands. He is sent to this office for an evaluation.  Past Medical History:  Diagnosis Date  . Allergic rhinitis   . 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 Coronary artery disease   . Depression   . Diabetes mellitus without complication ([place])   . GERD (gastroesophageal reflux disease)   . Hodgkin's disease ([place])    AGE 50 IN REMISSION  . HX: long term anticoagulant use   . Hyperlipidemia   . Impacted cerumen   . Major depression   . MDD (major depressive disorder)    STABLE  . Migraine   . Tachycardia   . Thrombophilia ([place])   . Vitamin D deficiency     Past Surgical History:  Procedure Laterality Date  . ANGIOPLASTY     5  . ARTIFICIAL AORTIC VALVE     [facility]  . CARDIAC CATHETERIZATION N/A 04/26/2016   Procedure: Right/Left Heart Cath and  Coronary/Graft Angiography;  Surgeon: Sherren Mocha, MD;  Location: [facility];  Service: Cardiovascular;  Laterality: N/A;  . CARDIAC SURGERY    . CORONARY ARTERY BYPASS GRAFT    . HEART STENT  2004, 2007   3 STENTS  . LYMPH NODE BIOPSY  1984  . SHUNT REPLACEMENT  1985   Subcutaneous shunt placed and then removed  . THORACOTOMY  2011    Family History  Problem Relation Age of Onset  . Breast cancer Mother   . Alcohol abuse Mother   . Diabetes type II Father   . Diverticulitis Father   . Alcoholism Brother   . Depression Brother   . Cancer Maternal Grandmother   . Cancer Maternal Grandfather   . Alcohol abuse Paternal Grandfather   . Heart disease Paternal Grandfather     Social history:  reports that he has never smoked. He has never used smokeless tobacco. He reports that he drinks alcohol. He reports that he does not use drugs.  Medications:  Prior to Admission medications   Medication Sig Start Date End Date Taking? Authorizing Provider  aspirin 81 MG tablet Take 81 mg by mouth daily.   Yes Historical Provider, MD  buPROPion (WELLBUTRIN XL) 300 MG 24 hr tablet Take 300 mg by mouth daily.   Yes Historical Provider, MD  Canagliflozin-Metformin HCl (INVOKAMET) 50-1000 MG TABS  Take 1 tablet by mouth 2 (two) times daily.    Yes Historical Provider, MD  famotidine (PEPCID) 20 MG tablet Take 20 mg by mouth daily.   Yes Historical Provider, MD  furosemide (LASIX) 40 MG tablet Take 1 tablet (40 mg total) by mouth daily. 04/19/16  Yes Thayer Headings, MD  losartan (COZAAR) 25 MG tablet Take 25 mg by mouth daily.   Yes Historical Provider, MD  metoprolol tartrate (LOPRESSOR) 25 MG tablet Take 25 mg by mouth daily.    Yes Historical Provider, MD  Multiple Vitamins-Minerals (MENS MULTI VITAMIN & MINERAL) TABS Take 1 tablet by mouth daily.   Yes Historical Provider, MD  [facility] VERIO test strip 1 strip by Does not apply route as directed. 04/06/16  Yes Historical Provider, MD    pantoprazole (PROTONIX) 40 MG tablet Take 1 tablet (40 mg total) by mouth daily. Take 30-60 min before first meal of the day 04/01/16  Yes Tanda Rockers, MD  potassium chloride (K-DUR) 10 MEQ tablet Take 1 tablet (10 mEq total) by mouth daily. 04/19/16  Yes Thayer Headings, MD  prochlorperazine (COMPAZINE) 10 MG tablet Take 1 tablet (10 mg total) by mouth 2 (two) times daily as needed for nausea or vomiting (Nausea ). 09/07/14  Yes Cleatrice Burke, PA-C  rosuvastatin (CRESTOR) 40 MG tablet Take 40 mg by mouth daily.   Yes Historical Provider, MD  Testosterone 12.5 MG/ACT (1%) GEL Apply 12.5 mg topically daily as needed (for hormone therapy).  10/19/14  Yes Historical Provider, MD  warfarin (COUMADIN) 2 MG tablet Take 2 mg by mouth every Friday. Take 2 mg + 3 mg tablet = 5 mg every Friday.  3 mg all other days   Yes Historical Provider, MD  warfarin (COUMADIN) 3 MG tablet Take 1 tablet (3 mg total) by mouth as directed. Take 3 mg by mouth as directed by [facility] Patient taking differently: Take 3 mg by mouth as directed. 3 mg every day except Friday take 5 mg 03/09/16  Yes Thayer Headings, MD  escitalopram (LEXAPRO) 10 MG tablet  06/16/16   Historical Provider, MD  nitroGLYCERIN (NITROSTAT) 0.4 MG SL tablet Place 1 tablet (0.4 mg total) under the tongue every 5 (five) minutes as needed for chest pain. Patient not taking: Reported on 07/23/2016 10/21/15   Thayer Headings, MD     No Known Allergies  ROS:  Out of a complete 14 system review of symptoms, the patient complains only of the following symptoms, and all other reviewed systems are negative.  Fatigue Palpitations of the heart Blurred vision Shortness of breath Headache Depression, decreased energy  Blood pressure 113/71, pulse 74, height 5\' 11"  (1.803 m), weight 193 lb (87.5 kg).  Physical Exam  General: The patient is alert and cooperative at the time of the examination.  Eyes: Pupils are equal, round, and reactive to light.  Discs are flat bilaterally.  Neck: The neck is supple, no carotid bruits are noted.  Respiratory: The respiratory examination is clear.  Cardiovascular: The cardiovascular examination reveals a regular rate and rhythm, no obvious murmurs or rubs are noted.  Skin: Extremities are without significant edema.  Neurologic Exam  Mental status: The patient is alert and oriented x 3 at the time of the examination. The patient has apparent normal recent and remote memory, with an apparently normal attention span and concentration ability.  Cranial nerves: Facial symmetry is present. There is good sensation of the face to pinprick and soft  touch bilaterally. The strength of the facial muscles and the muscles to head turning and shoulder shrug are normal bilaterally. Speech is well enunciated, no aphasia or dysarthria is noted. Extraocular movements are full. Visual fields are full. The tongue is midline, and the patient has symmetric elevation of the soft palate. No obvious hearing deficits are noted.  Motor: The motor testing reveals 5 over 5 strength of all 4 extremities. Good symmetric motor tone is noted throughout.  Sensory: Sensory testing is intact to pinprick, soft touch, vibration sensation, and position sense on all 4 extremities, with exception of some decrease in pinprick sensation on the lateral aspect of the left leg just below the knee. No evidence of extinction is noted.  Coordination: Cerebellar testing reveals good finger-nose-finger and heel-to-shin bilaterally.  Gait and station: Gait is normal. Tandem gait is normal. Romberg is negative. No drift is seen.  Reflexes: Deep tendon reflexes are symmetric and normal bilaterally, with exception of some decrease in ankle jerk reflexes bilaterally. Toes are downgoing bilaterally.   Assessment/Plan:  1. Lower extremity paresthesias  2. Diabetes  The patient has a relatively unremarkable examination, no clear evidence of a  significant peripheral neuropathy. The leg symptoms did not dissipate immediately after getting up and walking, it does not conform to a restless legs type syndrome. The patient will be placed on gabapentin in the evening hours, he will be set up for blood work today and he will return for nerve conduction studies of both legs, EMG of one leg. He will return for the EMG.  Jill Alexanders MD 07/23/2016 9:46 AM  [facility] [address] [place], [place] [address]  Phone [phone] Fax [phone]

## 2016-07-27 LAB — ANGIOTENSIN CONVERTING ENZYME: ANGIO CONVERT ENZYME: 40 U/L (ref 14–82)

## 2016-07-27 LAB — MULTIPLE MYELOMA PANEL, SERUM
ALBUMIN SERPL ELPH-MCNC: 4.2 g/dL (ref 2.9–4.4)
ALBUMIN/GLOB SERPL: 1.6 (ref 0.7–1.7)
ALPHA 1: 0.2 g/dL (ref 0.0–0.4)
ALPHA2 GLOB SERPL ELPH-MCNC: 0.6 g/dL (ref 0.4–1.0)
B-Globulin SerPl Elph-Mcnc: 1.2 g/dL (ref 0.7–1.3)
GAMMA GLOB SERPL ELPH-MCNC: 0.7 g/dL (ref 0.4–1.8)
GLOBULIN, TOTAL: 2.7 g/dL (ref 2.2–3.9)
IGG (IMMUNOGLOBIN G), SERUM: 713 mg/dL (ref 700–1600)
IGM (IMMUNOGLOBULIN M), SRM: 46 mg/dL (ref 20–172)
IgA/Immunoglobulin A, Serum: 235 mg/dL (ref 90–386)
Total Protein: 6.9 g/dL (ref 6.0–8.5)

## 2016-07-27 LAB — B. BURGDORFI ANTIBODIES

## 2016-07-27 LAB — RHEUMATOID FACTOR

## 2016-07-27 LAB — ANA W/REFLEX: ANA: NEGATIVE

## 2016-07-27 LAB — SEDIMENTATION RATE: Sed Rate: 2 mm/hr (ref 0–30)

## 2016-07-27 LAB — VITAMIN B12: VITAMIN B 12: 631 pg/mL (ref 211–946)

## 2016-08-02 ENCOUNTER — Ambulatory Visit (INDEPENDENT_AMBULATORY_CARE_PROVIDER_SITE_OTHER): Payer: 59 | Admitting: *Deleted

## 2016-08-02 DIAGNOSIS — Z5181 Encounter for therapeutic drug level monitoring: Secondary | ICD-10-CM

## 2016-08-02 DIAGNOSIS — Z954 Presence of other heart-valve replacement: Secondary | ICD-10-CM

## 2016-08-02 LAB — POCT INR: INR: 2.4

## 2016-08-09 ENCOUNTER — Encounter: Payer: Self-pay | Admitting: Neurology

## 2016-08-09 ENCOUNTER — Ambulatory Visit (INDEPENDENT_AMBULATORY_CARE_PROVIDER_SITE_OTHER): Payer: Self-pay | Admitting: Neurology

## 2016-08-09 ENCOUNTER — Ambulatory Visit (INDEPENDENT_AMBULATORY_CARE_PROVIDER_SITE_OTHER): Payer: 59 | Admitting: Neurology

## 2016-08-09 DIAGNOSIS — R202 Paresthesia of skin: Secondary | ICD-10-CM

## 2016-08-09 NOTE — Progress Notes (Signed)
Mark Choi is a 50 year old gentleman with history of diabetes. The patinet has had some dysesthesias in both legs. The patient comes in for EMG and nerve conduction study evaluation, the study is normal.  The patient indicates that the use of gabapentin has been very helpful for his dysesthesias, he is sleeping well on the medication. We will continue the current dose for now, he will follow-up in 6 months, sooner if needed.  The etiology of his dysesthesias are not clear. I would question whether there may be a small fiber neuropathy.

## 2016-08-09 NOTE — Procedures (Signed)
     HISTORY:  Mark Choi is a 50 year old gentleman with a history of diabetes who has reported some dysesthesias in the lower extremities that is present at nighttime, but does not improve with ambulation. The patient denies any back pain. He is being evaluated for the above discomfort.  NERVE CONDUCTION STUDIES:  Nerve conduction studies were performed on both lower extremities. The distal motor latencies and motor amplitudes for the peroneal and posterior tibial nerves were within normal limits. The nerve conduction velocities for these nerves were also normal. The H reflex latencies were normal. The sensory latencies for the peroneal nerves were within normal limits.   EMG STUDIES:  EMG study was performed on the right lower extremity:  The tibialis anterior muscle reveals 2 to 4K motor units with full recruitment. No fibrillations or positive waves were seen. The peroneus tertius muscle reveals 2 to 4K motor units with full recruitment. No fibrillations or positive waves were seen. The medial gastrocnemius muscle reveals 1 to 3K motor units with full recruitment. No fibrillations or positive waves were seen. The vastus lateralis muscle reveals 2 to 4K motor units with full recruitment. No fibrillations or positive waves were seen. The iliopsoas muscle reveals 2 to 4K motor units with full recruitment. No fibrillations or positive waves were seen. The biceps femoris muscle (long head) reveals 2 to 4K motor units with full recruitment. No fibrillations or positive waves were seen. The lumbosacral paraspinal muscles were tested at 3 levels, and revealed no abnormalities of insertional activity at all 3 levels tested. There was poor relaxation.   IMPRESSION:  Nerve conduction studies done on both lower extremities were within normal limits. No evidence of a peripheral neuropathy was seen. A small fiber neuropathy may be missed by standard nerve conduction studies, however. Clinical  correlation is required. EMG evaluation of the right lower extremity was unremarkable, no evidence of an overlying lumbosacral radiculopathy was seen.  Jill Alexanders MD 08/09/2016 9:26 AM  [facility] [address] [place], [place] [address]  Phone [phone] Fax [phone]

## 2016-08-20 ENCOUNTER — Other Ambulatory Visit: Payer: Self-pay | Admitting: *Deleted

## 2016-08-20 MED ORDER — GABAPENTIN 300 MG PO CAPS: 300.0000 mg | ORAL_CAPSULE | Freq: Every day | ORAL | 1 refills | Status: DC

## 2016-08-20 NOTE — Telephone Encounter (Signed)
Received fax from [facility] relating to 90 day supply for gabapentin.  Done.  I called [facility] and let them know pt getting thru [facility].  Cancel 30day refills.

## 2016-08-23 ENCOUNTER — Ambulatory Visit (INDEPENDENT_AMBULATORY_CARE_PROVIDER_SITE_OTHER): Payer: 59 | Admitting: Pharmacist

## 2016-08-23 ENCOUNTER — Encounter: Payer: Self-pay | Admitting: Internal Medicine

## 2016-08-23 ENCOUNTER — Encounter: Payer: Self-pay | Admitting: Cardiovascular Disease

## 2016-08-23 DIAGNOSIS — Z5181 Encounter for therapeutic drug level monitoring: Secondary | ICD-10-CM

## 2016-08-23 DIAGNOSIS — Z954 Presence of other heart-valve replacement: Secondary | ICD-10-CM

## 2016-08-23 LAB — POCT INR: INR: 3.4

## 2016-08-23 MED ORDER — WARFARIN SODIUM 5 MG PO TABS: 5.0000 mg | ORAL_TABLET | Freq: Every day | ORAL | 1 refills | Status: DC

## 2016-08-25 ENCOUNTER — Other Ambulatory Visit: Payer: Self-pay | Admitting: Family Medicine

## 2016-08-25 ENCOUNTER — Ambulatory Visit
Admission: RE | Admit: 2016-08-25 | Discharge: 2016-08-25 | Disposition: A | Discharge status: Home / Self Care | Payer: 59 | Source: Ambulatory Visit | Attending: Family Medicine | Admitting: Family Medicine

## 2016-08-25 DIAGNOSIS — R0989 Other specified symptoms and signs involving the circulatory and respiratory systems: Secondary | ICD-10-CM

## 2016-08-26 NOTE — Telephone Encounter (Signed)
Left message for patient earlier today to call back to discuss his symptoms.  I advised Dr. Acie Fredrickson can see him in the office if needed.

## 2016-08-30 NOTE — Telephone Encounter (Signed)
Spoke with patient who states he is currently recovering from pneumonia. He states the SOB he sent the message about was going on prior to the diagnosis of pneumonia.  He would like to keep his appointment on 11/30 with Dr. Acie Fredrickson.

## 2016-09-09 ENCOUNTER — Ambulatory Visit (INDEPENDENT_AMBULATORY_CARE_PROVIDER_SITE_OTHER): Payer: 59 | Admitting: Cardiovascular Disease

## 2016-09-09 ENCOUNTER — Encounter: Payer: Self-pay | Admitting: Cardiovascular Disease

## 2016-09-09 VITALS — BP 122/62 | HR 100 | Ht 71.0 in | Wt 194.0 lb

## 2016-09-09 DIAGNOSIS — I2510 Atherosclerotic heart disease of native coronary artery without angina pectoris: Secondary | ICD-10-CM

## 2016-09-09 DIAGNOSIS — I330 Acute and subacute infective endocarditis: Secondary | ICD-10-CM | POA: Diagnosis not present

## 2016-09-09 DIAGNOSIS — I2583 Coronary atherosclerosis due to lipid rich plaque: Secondary | ICD-10-CM | POA: Diagnosis not present

## 2016-09-09 DIAGNOSIS — Z954 Presence of other heart-valve replacement: Secondary | ICD-10-CM

## 2016-09-09 MED ORDER — METOPROLOL TARTRATE 25 MG PO TABS: 25.0000 mg | ORAL_TABLET | Freq: Two times a day (BID) | ORAL | 2 refills | Status: DC

## 2016-09-09 NOTE — Progress Notes (Signed)
Cardiology Office Note   Date:  09/09/2016   ID:  Mark Choi, DOB Sep 28, 1966, MRN [id number]  PCP:  Rachell Cipro, MD  Cardiologist:   Mark Moores, MD   No chief complaint on file.  Problem List: 1. Status post aortic valve placement - 2010  2. Diabetes mellitus 3. Hyperlipidemia 4. Coronary artery disease -  CABG in 1998 , multiple stents following CABG  5. Hodgkin's disease - s/p mantal radiation, age 51.  6. 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 male who presents for follow up for his AVR.   Since I saw him several months ago, he has developed symptoms  He has had dyspnea.  With any sort of exertion.  Associated with lightheadedness.  Resolves after several months.  Has occasional dyspnea even with sitting   Not related to a URI that he had a week ago. Also has some tingling in his arms with exertion. Rare cp Has been going on for 1 1/2 months.  Progressive worsening.  These symptoms feel similar to his symptoms that he had prior to his CABG.    Feb. 17, 2016:  Mr. Mark Choi had his echo yesterday and was  Found to have a vegetation on the aortic valve.  The vegetation is a filamentous strand like growth.  He has known endocarditis in 2014 and was treated at another hospital.  He has not had any symptoms of endocarditis.    Mark Choi is doing very well.  Not sick in any way. No fevers, rash,  No blood in urine  Has had migraine headaches.  Doubt that's related.   December 17, 2014:  Mark Choi is seen for follow up. He was found have a vegetation on echocardiogram. He has a known history of endocarditis in the past. His blood cultures are negative. His sedimentation rate is 1. He continues to have symptoms of chest discomfort. He's not having any fevers or chills. Has exertional chest pain .  Any exertion causes moderate dyspnea.  Does not take NTG ( causes him to have head aches)   April 01, 2015:   Having more orthostatic  hypotension.   Also having trouble keeping  INR theraputic.   CP{ has not worsened.  HR is in the upper normal range.  He has tried more metoprolol but his BP got too low   Jan. 10, 2017:  Mark Choi is seen for follow up for his history of endocarditis and coronary artery disease. He has continued to have episodes of angina. I've reviewed the Myoview study from last year. I've also reviewed the echocardiogram.  The angina seems to be limiting his exertion.   Does not want to take NTG because of the associated headache.  April 19, 2016:  Mark Choi is having exertional dyspnea.   No angina .   Starting to limit his activities.  Will be seeing pulmonary this afternoon.   Aug. 28, 2017 Mark Choi had a right and left heart catheterization in July. He has severe native coronary artery disease but his grafts appear to be fairly patent. His right heart pressures are normal. There is no evidence of constrictive pericarditis.  Still very short of breath .   Has seen pulmonary   He's complaining of neuropathy pain in his feet. He has achiness in his left lower leg.  Nov. 30, 2017:  Mark Choi is seen today for his premature CAD, AVR. He recently called with worsening dyspnea and was found  to have pneumonia. Still short of breath .  Suggest cardiac MRI   Able to do sedentary things but not very exertional activities. Is able to walk on the treadmill for 20 minutes but gets short of breath walking to the mailbox which is up a slight incline.  Past Medical History:  Diagnosis Date  . Allergic rhinitis   . 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 Coronary artery disease   . Depression   . Diabetes mellitus without complication ([place])   . GERD (gastroesophageal reflux disease)   . Hodgkin's disease ([place])    AGE 34 IN REMISSION  . HX: long term anticoagulant use   . Hyperlipidemia   . Impacted cerumen   . Major depression   . MDD (major depressive disorder)    STABLE  . Migraine   . Tachycardia   . Thrombophilia  ([place])   . Vitamin D deficiency     Past Surgical History:  Procedure Laterality Date  . ANGIOPLASTY     5  . ARTIFICIAL AORTIC VALVE     [facility]  . CARDIAC CATHETERIZATION N/A 04/26/2016   Procedure: Right/Left Heart Cath and Coronary/Graft Angiography;  Surgeon: Sherren Mocha, MD;  Location: [facility];  Service: Cardiovascular;  Laterality: N/A;  . CARDIAC SURGERY    . CORONARY ARTERY BYPASS GRAFT    . HEART STENT  2004, 2007   3 STENTS  . LYMPH NODE BIOPSY  1984  . SHUNT REPLACEMENT  1985   Subcutaneous shunt placed and then removed  . THORACOTOMY  2011     Current Outpatient Prescriptions  Medication Sig Dispense Refill  . aspirin 81 MG tablet Take 81 mg by mouth daily.    Marland Kitchen buPROPion (WELLBUTRIN XL) 300 MG 24 hr tablet Take 300 mg by mouth daily.    . Canagliflozin-Metformin HCl (INVOKAMET) 50-1000 MG TABS Take 1 tablet by mouth 2 (two) times daily.     Marland Kitchen escitalopram (LEXAPRO) 10 MG tablet Take 10 mg by mouth daily.     . famotidine (PEPCID) 20 MG tablet Take 20 mg by mouth daily.    . furosemide (LASIX) 40 MG tablet Take 1 tablet (40 mg total) by mouth daily. 30 tablet 11  . gabapentin (NEURONTIN) 300 MG capsule Take 1 capsule (300 mg total) by mouth at bedtime. 90 capsule 1  . losartan (COZAAR) 25 MG tablet Take 25 mg by mouth daily.    . metoprolol tartrate (LOPRESSOR) 25 MG tablet Take 25 mg by mouth daily.     . Multiple Vitamins-Minerals (MENS MULTI VITAMIN & MINERAL) TABS Take 1 tablet by mouth daily.    . nitroGLYCERIN (NITROSTAT) 0.4 MG SL tablet Place 1 tablet (0.4 mg total) under the tongue every 5 (five) minutes as needed for chest pain. 25 tablet 6  . ONETOUCH VERIO test strip 1 strip by Does not apply route as directed.  11  . pantoprazole (PROTONIX) 40 MG tablet Take 1 tablet (40 mg total) by mouth daily. Take 30-60 min before first meal of the day 30 tablet 2  . potassium chloride (K-DUR) 10 MEQ tablet Take 1 tablet (10 mEq total) by mouth daily.  30 tablet 11  . prochlorperazine (COMPAZINE) 10 MG tablet Take 1 tablet (10 mg total) by mouth 2 (two) times daily as needed for nausea or vomiting (Nausea ). 10 tablet 0  . rosuvastatin (CRESTOR) 40 MG tablet Take 40 mg by mouth daily.    . Testosterone 12.5 MG/ACT (1%) GEL  Apply 12.5 mg topically daily as needed (for hormone therapy).   1  . warfarin (COUMADIN) 3 MG tablet Take 1 tablet (3 mg total) by mouth as directed. Take 3 mg by mouth as directed by [facility] (Patient taking differently: Take 3 mg by mouth as directed. 3 mg every day except Friday take 5 mg) 120 tablet 1  . warfarin (COUMADIN) 5 MG tablet Take 1 tablet (5 mg total) by mouth daily. 30 tablet 1   No current facility-administered medications for this visit.     Allergies:   Patient has no known allergies.    Social History:  The patient  reports that he has never smoked. He has never used smokeless tobacco. He reports that he drinks alcohol. He reports that he does not use drugs.   Family History:  The patient's family history includes Alcohol abuse in his mother and paternal grandfather; Alcoholism in his brother; Breast cancer in his mother; Cancer in his maternal grandfather and maternal grandmother; Depression in his brother; Diabetes type II in his father; Diverticulitis in his father; Heart disease in his paternal grandfather.    ROS:  Please see the history of present illness.    Review of Systems: Constitutional:  denies fever, chills, diaphoresis, appetite change and fatigue.  HEENT: denies photophobia, eye pain, redness, hearing loss, ear pain, congestion, sore throat, rhinorrhea, sneezing, neck pain, neck stiffness and tinnitus.  Respiratory: admits to SOB, DOE,  Cardiovascular: denies chest pain, palpitations and leg swelling.  Gastrointestinal: denies nausea, vomiting, abdominal pain, diarrhea, constipation, blood in stool.  Genitourinary: denies dysuria, urgency, frequency, hematuria, flank pain and  difficulty urinating.  Musculoskeletal: denies  myalgias, back pain, joint swelling, arthralgias and gait problem.   Skin: denies pallor, rash and wound.  Neurological: denies dizziness, seizures, syncope, weakness, light-headedness, numbness and headaches.   Hematological: denies adenopathy, easy bruising, personal or family bleeding history.  Psychiatric/ Behavioral: denies suicidal ideation, mood changes, confusion, nervousness, sleep disturbance and agitation.       All other systems are reviewed and negative.    PHYSICAL EXAM: VS:  BP 122/62   Pulse 100   Ht 5\' 11"  (1.803 m)   Wt 194 lb (88 kg)   BMI 27.06 kg/m  , BMI Body mass index is 27.06 kg/m. GEN: Well nourished, well developed, in no acute distress  HEENT: normal  Neck: no JVD, carotid bruits, or masses Cardiac: RRR; mechanical S2. no murmurs, rubs, or gallops,no edema  Respiratory:  clear to auscultation bilaterally, normal work of breathing GI: soft, nontender, nondistended, + BS MS: no deformity or atrophy  Skin: warm and dry, no rash Neuro:  Strength and sensation are intact Psych: normal   EKG:  EKG is   ordered today. NSR at 88. 1st degree AV block , right axis deviation    Recent Labs: 03/10/2016: ALT 30 04/07/2016: Pro B Natriuretic peptide (BNP) 54.0; TSH 3.54 04/19/2016: BUN 18; Creat 1.05; Hemoglobin 13.1; Platelets 192; Potassium 4.5; Sodium 140    Lipid Panel    Component Value Date/Time   CHOL 97 (L) 10/16/2015 0859   TRIG 208 (H) 10/16/2015 0859   HDL 27 (L) 10/16/2015 0859   CHOLHDL 3.6 10/16/2015 0859   VLDL 42 (H) 10/16/2015 0859   LDLCALC 28 10/16/2015 0859      Wt Readings from Last 3 Encounters:  09/09/16 194 lb (88 kg)  07/23/16 193 lb (87.5 kg)  06/07/16 192 lb 12.8 oz (87.5 kg)  Other studies Reviewed: Additional studies/ records that were reviewed today include: . Review of the above records demonstrates:    ASSESSMENT AND PLAN:  Problem List: 1. Status post  aortic valve placement - 2010  -  His INR levels have been quite variable. We will refer him to our Coumadin clinic to follow his INR levels.  2. Diabetes mellitus - followed by his general medical doctor  3. Hyperlipidemia -  Will will check fasting lipids, liver enzymes, and basic metabolic profile when I see him again in 6 month.  4. Coronary artery disease -  CABG 1998, Mohammadali has had multiple stent procedures following his bypass surgery.  Is not having any significant angina but is definitely limited by DOE - progressive    5. Hodgkin's disease - s/p mantal radiation, age 52.   6. Hx of endocarditis in 2014 -  he seems very stable. We found a long filamentous vegetation on his aortic valve. His workup included blood cultures and sedimentation rate which were normal. He has no symptoms. We will continue to monitor. He will need to take SBE prophylaxis prior to any dental procedures.  7.  Continued dyspnea:  We performed a cardiac catheterization and he does not have any significant coronary artery disease. An echocardiogram does not reveal any evidence of constrictive pericarditis.  His resting heart rate is 100. He has tried higher doses of metoprolol in the past but it caused his blood pressure dropped too low. We will try increasing metoprolol slightly to 25 mg morning with 12.5 mg at night. If his blood pressure drops too low, I think that we may try eliminating the losartan so that we can continue up titration of the metoprolol.  The hope is that we can achieve a resting heart rate of 80.  We discussed doing a cardiac MRI. He's not interested in doing that at this time.  Current medicines are reviewed at length with the patient today.  The patient does not have concerns regarding medicines.  The following changes have been made:  no change   Disposition:   FU with me in 3 months      Signed, Mark Moores, MD  09/09/2016 8:53 AM    [facility], [place], [place]  [address] Phone: [phone]; Fax: [phone]

## 2016-09-09 NOTE — Patient Instructions (Signed)
Medication Instructions:  Your physician has recommended you make the following change in your medication:  1. Increase Metoprolol (25 mg ) am (12.5 mg ) pm, sent in to patient's requested pharmacy.   Labwork: -None  Testing/Procedures: -None  Follow-Up: Your physician recommends that you keep your scheduled follow-up appointment with Dr. Acie Fredrickson on February 26 @ 10:30 am.   Any Other Special Instructions Will Be Listed Below (If Applicable).     If you need a refill on your cardiac medications before your next appointment, please call your pharmacy.

## 2016-09-13 ENCOUNTER — Ambulatory Visit (INDEPENDENT_AMBULATORY_CARE_PROVIDER_SITE_OTHER): Payer: 59 | Admitting: Pharmacist

## 2016-09-13 DIAGNOSIS — Z5181 Encounter for therapeutic drug level monitoring: Secondary | ICD-10-CM

## 2016-09-13 DIAGNOSIS — Z954 Presence of other heart-valve replacement: Secondary | ICD-10-CM

## 2016-09-13 LAB — POCT INR: INR: 2.6

## 2016-09-15 ENCOUNTER — Other Ambulatory Visit: Payer: Self-pay | Admitting: *Deleted

## 2016-09-15 MED ORDER — METOPROLOL TARTRATE 25 MG PO TABS: ORAL_TABLET | ORAL | 3 refills | Status: DC

## 2016-09-15 NOTE — Telephone Encounter (Signed)
[facility] faxed [place] a clarification on the metoprolol rx as it was sent in with two different sigs as below. Medication Detail    Disp Refills Start End   metoprolol tartrate (LOPRESSOR) 25 MG tablet 180 tablet 2 09/09/2016    Sig - Route: Take 1 tablet (25 mg total) by mouth 2 (two) times daily. (25 mg ) am (12.5 mg) pm - Oral   E-Prescribing Status: Receipt confirmed by pharmacy (09/09/2016 9:03 AM EST)   Pharmacy   [facility] [place], [place]       Per office visit on the same day that the rx was sent  Date/Time Report Action User  09/09/2016 9:07 AM After Visit Summary Printed [name]  Patient Instructions   Medication Instructions:  Your physician has recommended you make the following change in your medication:  1. Increase Metoprolol (25 mg ) am (12.5 mg ) pm, sent in to patient's requested pharmacy.   I will correct rx and resend.

## 2016-09-16 ENCOUNTER — Other Ambulatory Visit: Payer: Self-pay | Admitting: *Deleted

## 2016-09-16 MED ORDER — FUROSEMIDE 40 MG PO TABS: 40.0000 mg | ORAL_TABLET | Freq: Every day | ORAL | 3 refills | Status: DC

## 2016-09-16 MED ORDER — POTASSIUM CHLORIDE ER 10 MEQ PO TBCR: 10.0000 meq | EXTENDED_RELEASE_TABLET | Freq: Every day | ORAL | 3 refills | Status: DC

## 2016-10-12 ENCOUNTER — Ambulatory Visit (INDEPENDENT_AMBULATORY_CARE_PROVIDER_SITE_OTHER): Payer: 59 | Admitting: *Deleted

## 2016-10-12 DIAGNOSIS — Z954 Presence of other heart-valve replacement: Secondary | ICD-10-CM

## 2016-10-12 DIAGNOSIS — Z5181 Encounter for therapeutic drug level monitoring: Secondary | ICD-10-CM

## 2016-10-12 LAB — POCT INR: INR: 3.4

## 2016-11-01 ENCOUNTER — Ambulatory Visit (INDEPENDENT_AMBULATORY_CARE_PROVIDER_SITE_OTHER): Payer: 59 | Admitting: *Deleted

## 2016-11-01 DIAGNOSIS — Z954 Presence of other heart-valve replacement: Secondary | ICD-10-CM

## 2016-11-01 DIAGNOSIS — Z5181 Encounter for therapeutic drug level monitoring: Secondary | ICD-10-CM | POA: Diagnosis not present

## 2016-11-01 LAB — POCT INR: INR: 4

## 2016-11-15 ENCOUNTER — Ambulatory Visit (INDEPENDENT_AMBULATORY_CARE_PROVIDER_SITE_OTHER): Payer: 59

## 2016-11-15 DIAGNOSIS — Z5181 Encounter for therapeutic drug level monitoring: Secondary | ICD-10-CM

## 2016-11-15 DIAGNOSIS — Z954 Presence of other heart-valve replacement: Secondary | ICD-10-CM

## 2016-11-15 LAB — POCT INR: INR: 1.6

## 2016-11-29 ENCOUNTER — Ambulatory Visit (INDEPENDENT_AMBULATORY_CARE_PROVIDER_SITE_OTHER): Payer: 59 | Admitting: *Deleted

## 2016-11-29 DIAGNOSIS — Z5181 Encounter for therapeutic drug level monitoring: Secondary | ICD-10-CM

## 2016-11-29 DIAGNOSIS — Z954 Presence of other heart-valve replacement: Secondary | ICD-10-CM

## 2016-11-29 LAB — POCT INR: INR: 2.7

## 2016-12-06 ENCOUNTER — Ambulatory Visit (INDEPENDENT_AMBULATORY_CARE_PROVIDER_SITE_OTHER): Payer: 59 | Admitting: Cardiovascular Disease

## 2016-12-06 ENCOUNTER — Encounter: Payer: Self-pay | Admitting: Cardiovascular Disease

## 2016-12-06 VITALS — BP 106/66 | HR 77 | Ht 71.0 in | Wt 196.0 lb

## 2016-12-06 DIAGNOSIS — I2583 Coronary atherosclerosis due to lipid rich plaque: Secondary | ICD-10-CM

## 2016-12-06 DIAGNOSIS — I2510 Atherosclerotic heart disease of native coronary artery without angina pectoris: Secondary | ICD-10-CM

## 2016-12-06 DIAGNOSIS — Z954 Presence of other heart-valve replacement: Secondary | ICD-10-CM

## 2016-12-06 NOTE — Patient Instructions (Signed)
Medication Instructions:  Your physician recommends that you continue on your current medications as directed. Please refer to the Current Medication list given to you today.   Labwork: None Ordered   Testing/Procedures: None Ordered   Follow-Up: Your physician recommends that you schedule a follow-up appointment in: 3 months with Dr. Nahser   If you need a refill on your cardiac medications before your next appointment, please call your pharmacy.   Thank you for choosing [facility]! Scottie Stanish, RN [phone]    

## 2016-12-06 NOTE — Progress Notes (Signed)
Cardiology Office Note   Date:  12/06/2016   ID:  Mark Choi, DOB 08-04-1966, MRN [id number]  PCP:  Rachell Cipro, MD  Cardiologist:   Mertie Moores, MD   Chief Complaint  Patient presents with  . Follow-up    CAD   Problem List: 1. Status post aortic valve placement - 2010  2. Diabetes mellitus 3. Hyperlipidemia 4. Coronary artery disease -  CABG in 1998 , multiple stents following CABG  5. Hodgkin's disease - s/p mantal radiation, age 51.  6. 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 male who presents for follow up for his AVR.   Since I saw him several months ago, he has developed symptoms  He has had dyspnea.  With any sort of exertion.  Associated with lightheadedness.  Resolves after several months.  Has occasional dyspnea even with sitting   Not related to a URI that he had a week ago. Also has some tingling in his arms with exertion. Rare cp Has been going on for 1 1/2 months.  Progressive worsening.  These symptoms feel similar to his symptoms that he had prior to his CABG.    Feb. 17, 2016:  Mark Choi had his echo yesterday and was  Found to have a vegetation on the aortic valve.  The vegetation is a filamentous strand like growth.  He has known endocarditis in 2014 and was treated at another hospital.  He has not had any symptoms of endocarditis.    Mark Choi is doing very well.  Not sick in any way. No fevers, rash,  No blood in urine  Has had migraine headaches.  Doubt that's related.   December 17, 2014:  Mark Choi is seen for follow up. He was found have a vegetation on echocardiogram. He has a known history of endocarditis in the past. His blood cultures are negative. His sedimentation rate is 1. He continues to have symptoms of chest discomfort. He's not having any fevers or chills. Has exertional chest pain .  Any exertion causes moderate dyspnea.  Does not take NTG ( causes him to have head aches)   April 01, 2015:   Having more orthostatic hypotension.   Also having trouble keeping  INR theraputic.   CP{ has not worsened.  HR is in the upper normal range.  He has tried more metoprolol but his BP got too low   Jan. 10, 2017:  Mark Choi is seen for follow up for his history of endocarditis and coronary artery disease. He has continued to have episodes of angina. I've reviewed the Myoview study from last year. I've also reviewed the echocardiogram.  The angina seems to be limiting his exertion.   Does not want to take NTG because of the associated headache.  April 19, 2016:  Mark Choi is having exertional dyspnea.   No angina .   Starting to limit his activities.  Will be seeing pulmonary this afternoon.   Aug. 28, 2017 Mark Choi had a right and left heart catheterization in July. He has severe native coronary artery disease but his grafts appear to be fairly patent. His right heart pressures are normal. There is no evidence of constrictive pericarditis.  Still very short of breath .   Has seen pulmonary   He's complaining of neuropathy pain in his feet. He has achiness in his left lower leg.  Nov. 30, 2017:  Mark Choi is seen today for his premature CAD, AVR.  He recently called with worsening dyspnea and was found to have pneumonia. Still short of breath .  Suggest cardiac MRI   Able to do sedentary things but not very exertional activities. Is able to walk on the treadmill for 20 minutes but gets short of breath walking to the mailbox which is up a slight incline.  Feb. 26, 2018:  Still has severe dyspnea with exertion. Fairly comfortable at rest  Worse over the past 2 weeks.  Decreased his metoprolol to 25 mg each morning - was having problems with low blood pressure and insomnia at night when he took the evening dose.   Past Medical History:  Diagnosis Date  . Allergic rhinitis   . 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 Coronary artery disease   . Depression   . Diabetes mellitus without  complication ([place])   . GERD (gastroesophageal reflux disease)   . Hodgkin's disease ([place])    AGE 29 IN REMISSION  . HX: long term anticoagulant use   . Hyperlipidemia   . Impacted cerumen   . Major depression   . MDD (major depressive disorder)    STABLE  . Migraine   . Tachycardia   . Thrombophilia ([place])   . Vitamin D deficiency     Past Surgical History:  Procedure Laterality Date  . ANGIOPLASTY     5  . ARTIFICIAL AORTIC VALVE     [facility]  . CARDIAC CATHETERIZATION N/A 04/26/2016   Procedure: Right/Left Heart Cath and Coronary/Graft Angiography;  Surgeon: Sherren Mocha, MD;  Location: [facility];  Service: Cardiovascular;  Laterality: N/A;  . CARDIAC SURGERY    . CORONARY ARTERY BYPASS GRAFT    . HEART STENT  2004, 2007   3 STENTS  . LYMPH NODE BIOPSY  1984  . SHUNT REPLACEMENT  1985   Subcutaneous shunt placed and then removed  . THORACOTOMY  2011     Current Outpatient Prescriptions  Medication Sig Dispense Refill  . aspirin 81 MG tablet Take 81 mg by mouth daily.    . Canagliflozin-Metformin HCl (INVOKAMET) 50-1000 MG TABS Take 1 tablet by mouth 2 (two) times daily.     Marland Kitchen escitalopram (LEXAPRO) 10 MG tablet Take 10 mg by mouth daily.     . famotidine (PEPCID) 20 MG tablet Take 20 mg by mouth daily.    . furosemide (LASIX) 40 MG tablet Take 1 tablet (40 mg total) by mouth daily. 90 tablet 3  . gabapentin (NEURONTIN) 300 MG capsule Take 1 capsule (300 mg total) by mouth at bedtime. 90 capsule 1  . losartan (COZAAR) 25 MG tablet Take 25 mg by mouth daily.    . metoprolol tartrate (LOPRESSOR) 25 MG tablet Take one tablet (25 mg) by mouth in the AM and one-half tablet (12.5 mg) by mouth in the PM. 135 tablet 3  . Multiple Vitamins-Minerals (MENS MULTI VITAMIN & MINERAL) TABS Take 1 tablet by mouth daily.    . nitroGLYCERIN (NITROSTAT) 0.4 MG SL tablet Place 1 tablet (0.4 mg total) under the tongue every 5 (five) minutes as needed for chest pain. 25 tablet 6  .  ONETOUCH VERIO test strip 1 strip by Does not apply route as directed.  11  . pantoprazole (PROTONIX) 40 MG tablet Take 1 tablet (40 mg total) by mouth daily. Take 30-60 min before first meal of the day 30 tablet 2  . potassium chloride (K-DUR) 10 MEQ tablet Take 1 tablet (10 mEq total) by mouth daily.  90 tablet 3  . prochlorperazine (COMPAZINE) 10 MG tablet Take 1 tablet (10 mg total) by mouth 2 (two) times daily as needed for nausea or vomiting (Nausea ). 10 tablet 0  . rosuvastatin (CRESTOR) 40 MG tablet Take 40 mg by mouth daily.    . Testosterone 12.5 MG/ACT (1%) GEL Apply 12.5 mg topically daily as needed (for hormone therapy).   1  . warfarin (COUMADIN) 3 MG tablet Take 1 tablet (3 mg total) by mouth as directed. Take 3 mg by mouth as directed by [facility] (Patient taking differently: Take 3 mg by mouth as directed. 3 mg every day except Friday take 5 mg) 120 tablet 1  . warfarin (COUMADIN) 5 MG tablet Take 1 tablet (5 mg total) by mouth daily. 30 tablet 1  . buPROPion (WELLBUTRIN XL) 150 MG 24 hr tablet Take 150 mg by mouth daily.  1   No current facility-administered medications for this visit.     Allergies:   Patient has no known allergies.    Social History:  The patient  reports that he has never smoked. He has never used smokeless tobacco. He reports that he drinks alcohol. He reports that he does not use drugs.   Family History:  The patient's family history includes Alcohol abuse in his mother and paternal grandfather; Alcoholism in his brother; Breast cancer in his mother; Cancer in his maternal grandfather and maternal grandmother; Depression in his brother; Diabetes type II in his father; Diverticulitis in his father; Heart disease in his paternal grandfather.    ROS:  Please see the history of present illness.    Review of Systems: Constitutional:  denies fever, chills, diaphoresis, appetite change and fatigue.  HEENT: denies photophobia, eye pain, redness, hearing  loss, ear pain, congestion, sore throat, rhinorrhea, sneezing, neck pain, neck stiffness and tinnitus.  Respiratory: admits to SOB, DOE,  Cardiovascular: denies chest pain, palpitations and leg swelling.  Gastrointestinal: denies nausea, vomiting, abdominal pain, diarrhea, constipation, blood in stool.  Genitourinary: denies dysuria, urgency, frequency, hematuria, flank pain and difficulty urinating.  Musculoskeletal: denies  myalgias, back pain, joint swelling, arthralgias and gait problem.   Skin: denies pallor, rash and wound.  Neurological: denies dizziness, seizures, syncope, weakness, light-headedness, numbness and headaches.   Hematological: denies adenopathy, easy bruising, personal or family bleeding history.  Psychiatric/ Behavioral: denies suicidal ideation, mood changes, confusion, nervousness, sleep disturbance and agitation.       All other systems are reviewed and negative.    PHYSICAL EXAM: VS:  BP 106/66 (BP Location: Right Arm, Patient Position: Sitting, Cuff Size: Normal)   Pulse 77   Ht 5\' 11"  (1.803 m)   Wt 196 lb (88.9 kg)   SpO2 96%   BMI 27.34 kg/m  , BMI Body mass index is 27.34 kg/m. GEN: Well nourished, well developed, in no acute distress  HEENT: normal  Neck: no JVD, carotid bruits, or masses Cardiac: RRR; mechanical S2. no murmurs, rubs, or gallops,no edema  Respiratory:  clear to auscultation bilaterally, normal work of breathing GI: soft, nontender, nondistended, + BS MS: no deformity or atrophy  Skin: warm and dry, no rash Neuro:  Strength and sensation are intact Psych: normal   EKG:  EKG is   ordered today. NSR at 88. 1st degree AV block , right axis deviation    Recent Labs: 03/10/2016: ALT 30 04/07/2016: Pro B Natriuretic peptide (BNP) 54.0; TSH 3.54 04/19/2016: BUN 18; Creat 1.05; Hemoglobin 13.1; Platelets 192; Potassium 4.5; Sodium 140  Lipid Panel    Component Value Date/Time   CHOL 97 (L) 10/16/2015 0859   TRIG 208 (H)  10/16/2015 0859   HDL 27 (L) 10/16/2015 0859   CHOLHDL 3.6 10/16/2015 0859   VLDL 42 (H) 10/16/2015 0859   LDLCALC 28 10/16/2015 0859      Wt Readings from Last 3 Encounters:  12/06/16 196 lb (88.9 kg)  09/09/16 194 lb (88 kg)  07/23/16 193 lb (87.5 kg)      Other studies Reviewed: Additional studies/ records that were reviewed today include: . Review of the above records demonstrates:    ASSESSMENT AND PLAN:  Problem List: 1. Status post aortic valve placement - 2010  -  His INR levels have been quite variable. We will refer him to our Coumadin clinic to follow his INR levels.  2. Diabetes mellitus - followed by his general medical doctor  3. Hyperlipidemia -  Will will check fasting lipids, liver enzymes, and basic metabolic profile when I see him again in 6 month.  4. Coronary artery disease -  CABG 1998, [place] has had multiple stent procedures following his bypass surgery.  Is not having any significant angina but is definitely limited by DOE - progressive  5. Hodgkin's disease - s/p mantal radiation, age 53.   6. Hx of endocarditis in 2014 -  he seems very stable. We found a long filamentous vegetation on his aortic valve. His workup included blood cultures and sedimentation rate which were normal. He has no symptoms. We will continue to monitor. He will need to take SBE prophylaxis prior to any dental procedures.  7.  Continued dyspnea: We performed a cardiac catheterization and he does not have any significant coronary artery disease. An echocardiogram does not reveal any evidence of constrictive pericarditis.    He has tried higher doses of metoprolol in the past but it caused his blood pressure dropped too low.    We discussed doing a cardiac MRI. He's not interested in doing that at this time. We discussed doing a CPX  - also wanted to wait and see if he feels better after losing weight.   Also discussed consulting with the CHF team .  He wants to work on some  weight loss and see how he feels with that    Current medicines are reviewed at length with the patient today.  The patient does not have concerns regarding medicines.  The following changes have been made:  no change   Disposition:   FU with me in 3 months      Signed, Mertie Moores, MD  12/06/2016 11:03 AM    [facility] [place], [place], [place]  [address] Phone: [phone]; Fax: [phone]

## 2016-12-15 ENCOUNTER — Ambulatory Visit (INDEPENDENT_AMBULATORY_CARE_PROVIDER_SITE_OTHER): Payer: 59 | Admitting: *Deleted

## 2016-12-15 DIAGNOSIS — Z954 Presence of other heart-valve replacement: Secondary | ICD-10-CM | POA: Diagnosis not present

## 2016-12-15 DIAGNOSIS — Z5181 Encounter for therapeutic drug level monitoring: Secondary | ICD-10-CM

## 2016-12-15 LAB — POCT INR: INR: 4.3

## 2016-12-27 ENCOUNTER — Ambulatory Visit (INDEPENDENT_AMBULATORY_CARE_PROVIDER_SITE_OTHER): Payer: 59 | Admitting: Cardiovascular Disease

## 2016-12-27 ENCOUNTER — Encounter: Payer: Self-pay | Admitting: Cardiovascular Disease

## 2016-12-27 VITALS — BP 86/60 | HR 72 | Ht 71.0 in | Wt 193.4 lb

## 2016-12-27 DIAGNOSIS — I519 Heart disease, unspecified: Secondary | ICD-10-CM | POA: Insufficient documentation

## 2016-12-27 DIAGNOSIS — R0602 Shortness of breath: Secondary | ICD-10-CM | POA: Diagnosis not present

## 2016-12-27 DIAGNOSIS — Z952 Presence of prosthetic heart valve: Secondary | ICD-10-CM | POA: Diagnosis not present

## 2016-12-27 NOTE — Progress Notes (Signed)
Cardiology Office Note   Date:  12/27/2016   ID:  Mark Choi, DOB 07/06/1966, MRN [id number]  PCP:  Rachell Cipro, MD  Cardiologist:   Mertie Moores, MD   No chief complaint on file.  Problem List: 1. 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 Status post aortic valve placement - 2010  3. Hyperlipidemia 4. Coronary artery disease -  CABG in 1998 , multiple stents following CABG  5. Hodgkin's disease - s/p mantal radiation, age 51.  6. 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 Diabetes mellitus    Mark Choi is a 51 y.o. male who presents for follow up for his AVR.   Since I saw him several months ago, he has developed symptoms  He has had dyspnea.  With any sort of exertion.  Associated with lightheadedness.  Resolves after several months.  Has occasional dyspnea even with sitting   Not related to a URI that he had a week ago. Also has some tingling in his arms with exertion. Rare cp Has been going on for 1 1/2 months.  Progressive worsening.  These symptoms feel similar to his symptoms that he had prior to his CABG.    Feb. 17, 2016:  Mark Choi had his echo yesterday and was  Found to have a vegetation on the aortic valve.  The vegetation is a filamentous strand like growth.  He has known endocarditis in 2014 and was treated at another hospital.  He has not had any symptoms of endocarditis.    Mark Choi is doing very well.  Not sick in any way. No fevers, rash,  No blood in urine  Has had migraine headaches.  Doubt that's related.   December 17, 2014:  Mark Choi is seen for follow up. He was found have a vegetation on echocardiogram. He has a known history of endocarditis in the past. His blood cultures are negative. His sedimentation rate is 1. He continues to have symptoms of chest discomfort. He's not having any fevers or chills. Has exertional chest pain .  Any exertion causes moderate dyspnea.  Does not take NTG ( causes him to have  head aches)   April 01, 2015:   Having more orthostatic hypotension.   Also having trouble keeping  INR theraputic.   CP{ has not worsened.  HR is in the upper normal range.  He has tried more metoprolol but his BP got too low   Jan. 10, 2017:  Mark Choi is seen for follow up for his history of endocarditis and coronary artery disease. He has continued to have episodes of angina. I've reviewed the Myoview study from last year. I've also reviewed the echocardiogram.  The angina seems to be limiting his exertion.   Does not want to take NTG because of the associated headache.  April 19, 2016:  Mark Choi is having exertional dyspnea.   No angina .   Starting to limit his activities.  Will be seeing pulmonary this afternoon.   Aug. 28, 2017 Mark Choi had a right and left heart catheterization in July. He has severe native coronary artery disease but his grafts appear to be fairly patent. His right heart pressures are normal. There is no evidence of constrictive pericarditis.  Still very short of breath .   Has seen pulmonary   He's complaining of neuropathy pain in his feet. He has achiness in his left lower leg.  Nov. 30, 2017:  Mark Choi is seen today for his premature CAD,  AVR. He recently called with worsening dyspnea and was found to have pneumonia. Still short of breath .  Suggest cardiac MRI   Able to do sedentary things but not very exertional activities. Is able to walk on the treadmill for 20 minutes but gets short of breath walking to the mailbox which is up a slight incline.  Feb. 26, 2018:  Still has severe dyspnea with exertion. Fairly comfortable at rest  Worse over the past 2 weeks.  Decreased his metoprolol to 25 mg each morning - was having problems with low blood pressure and insomnia at night when he took the evening dose.  December 27, 2016:  Mark Choi presents with increasing shortness of breath .   Has severe dyspnea with climbing stairs - has to gasp for air  Is very limited at this  point .   We discussed CHF clinic   Past Medical History:  Diagnosis Date  . Allergic rhinitis   . 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 Coronary artery disease   . Depression   . Diabetes mellitus without complication ([place])   . GERD (gastroesophageal reflux disease)   . Hodgkin's disease ([place])    AGE 59 IN REMISSION  . HX: long term anticoagulant use   . Hyperlipidemia   . Impacted cerumen   . Major depression   . MDD (major depressive disorder)    STABLE  . Migraine   . Tachycardia   . Thrombophilia ([place])   . Vitamin D deficiency     Past Surgical History:  Procedure Laterality Date  . ANGIOPLASTY     5  . ARTIFICIAL AORTIC VALVE     [facility]  . CARDIAC CATHETERIZATION N/A 04/26/2016   Procedure: Right/Left Heart Cath and Coronary/Graft Angiography;  Surgeon: Sherren Mocha, MD;  Location: [facility];  Service: Cardiovascular;  Laterality: N/A;  . CARDIAC SURGERY    . CORONARY ARTERY BYPASS GRAFT    . HEART STENT  2004, 2007   3 STENTS  . LYMPH NODE BIOPSY  1984  . SHUNT REPLACEMENT  1985   Subcutaneous shunt placed and then removed  . THORACOTOMY  2011     Current Outpatient Prescriptions  Medication Sig Dispense Refill  . aspirin 81 MG tablet Take 81 mg by mouth daily.    Marland Kitchen buPROPion (WELLBUTRIN XL) 150 MG 24 hr tablet Take 150 mg by mouth daily.  1  . Canagliflozin-Metformin HCl (INVOKAMET) 50-1000 MG TABS Take 1 tablet by mouth 2 (two) times daily.     Marland Kitchen escitalopram (LEXAPRO) 10 MG tablet Pt is now taking 20 mg daily    . famotidine (PEPCID) 20 MG tablet Take 20 mg by mouth daily.    . furosemide (LASIX) 40 MG tablet Take 1 tablet (40 mg total) by mouth daily. 90 tablet 3  . gabapentin (NEURONTIN) 300 MG capsule Take 1 capsule (300 mg total) by mouth at bedtime. 90 capsule 1  . losartan (COZAAR) 25 MG tablet Take 25 mg by mouth daily.    . metoprolol tartrate (LOPRESSOR) 25 MG tablet Take 50 mg by mouth 2 (two) times daily.    . Multiple  Vitamins-Minerals (MENS MULTI VITAMIN & MINERAL) TABS Take 1 tablet by mouth daily.    . nitroGLYCERIN (NITROSTAT) 0.4 MG SL tablet Place 1 tablet (0.4 mg total) under the tongue every 5 (five) minutes as needed for chest pain. 25 tablet 6  . ONETOUCH VERIO test strip 1 strip by Does not apply route as  directed.  11  . pantoprazole (PROTONIX) 40 MG tablet Take 1 tablet (40 mg total) by mouth daily. Take 30-60 min before first meal of the day 30 tablet 2  . potassium chloride (K-DUR) 10 MEQ tablet Take 1 tablet (10 mEq total) by mouth daily. 90 tablet 3  . prochlorperazine (COMPAZINE) 10 MG tablet Take 1 tablet (10 mg total) by mouth 2 (two) times daily as needed for nausea or vomiting (Nausea ). 10 tablet 0  . rosuvastatin (CRESTOR) 40 MG tablet Take 40 mg by mouth daily.    . Testosterone 12.5 MG/ACT (1%) GEL Apply 12.5 mg topically daily as needed (for hormone therapy).   1  . warfarin (COUMADIN) 3 MG tablet Take 1 tablet (3 mg total) by mouth as directed. Take 3 mg by mouth as directed by Coumadin Clinic (Patient taking differently: Take 3 mg by mouth as directed. 3 mg every day except Friday take 5 mg) 120 tablet 1   No current facility-administered medications for this visit.     Allergies:   Patient has no known allergies.    Social History:  The patient  reports that he has never smoked. He has never used smokeless tobacco. He reports that he drinks alcohol. He reports that he does not use drugs.   Family History:  The patient's family history includes Alcohol abuse in his mother and paternal grandfather; Alcoholism in his brother; Breast cancer in his mother; Cancer in his maternal grandfather and maternal grandmother; Depression in his brother; Diabetes type II in his father; Diverticulitis in his father; Heart disease in his paternal grandfather.    ROS:  Please see the history of present illness.    Review of Systems: Constitutional:  denies fever, chills, diaphoresis, appetite  change and fatigue.  HEENT: denies photophobia, eye pain, redness, hearing loss, ear pain, congestion, sore throat, rhinorrhea, sneezing, neck pain, neck stiffness and tinnitus.  Respiratory: admits to SOB, DOE,  Cardiovascular: denies chest pain, palpitations and leg swelling.  Gastrointestinal: denies nausea, vomiting, abdominal pain, diarrhea, constipation, blood in stool.  Genitourinary: denies dysuria, urgency, frequency, hematuria, flank pain and difficulty urinating.  Musculoskeletal: denies  myalgias, back pain, joint swelling, arthralgias and gait problem.   Skin: denies pallor, rash and wound.  Neurological: denies dizziness, seizures, syncope, weakness, light-headedness, numbness and headaches.   Hematological: denies adenopathy, easy bruising, personal or family bleeding history.  Psychiatric/ Behavioral: denies suicidal ideation, mood changes, confusion, nervousness, sleep disturbance and agitation.       All other systems are reviewed and negative.    PHYSICAL EXAM: VS:  BP (!) 86/60 (BP Location: Left Arm, Patient Position: Sitting, Cuff Size: Normal)   Pulse 72   Ht 5\' 11"  (1.803 m)   Wt 193 lb 6.4 oz (87.7 kg)   SpO2 96%   BMI 26.97 kg/m  , BMI Body mass index is 26.97 kg/m. GEN: Well nourished, well developed, in no acute distress  HEENT: normal  Neck: no JVD, carotid bruits, or masses Cardiac: RRR; mechanical S2. no murmurs, rubs, or gallops,no edema  Respiratory:  clear to auscultation bilaterally, normal work of breathing GI: distended MS: no deformity or atrophy  Skin: warm and dry, no rash Neuro:  Strength and sensation are intact Psych: normal   EKG:  EKG is   ordered today. NSR at 72. 1st degree AV block   LBBB - new   Recent Labs: 03/10/2016: ALT 30 04/07/2016: Pro B Natriuretic peptide (BNP) 54.0; TSH 3.54 04/19/2016: BUN 18;  Creat 1.05; Hemoglobin 13.1; Platelets 192; Potassium 4.5; Sodium 140    Lipid Panel    Component Value Date/Time    CHOL 97 (L) 10/16/2015 0859   TRIG 208 (H) 10/16/2015 0859   HDL 27 (L) 10/16/2015 0859   CHOLHDL 3.6 10/16/2015 0859   VLDL 42 (H) 10/16/2015 0859   LDLCALC 28 10/16/2015 0859      Wt Readings from Last 3 Encounters:  12/27/16 193 lb 6.4 oz (87.7 kg)  12/06/16 196 lb (88.9 kg)  09/09/16 194 lb (88 kg)      Other studies Reviewed: Additional studies/ records that were reviewed today include: . Review of the above records demonstrates:    ASSESSMENT AND PLAN:  Problem List: 1. Status post aortic valve placement - 2010  -  His INR levels have been quite variable. We will refer him to our Coumadin clinic to follow his INR levels.  2. Diabetes mellitus - followed by his general medical doctor  3. Hyperlipidemia -  Will will check fasting lipids, liver enzymes, and basic metabolic profile when I see him again in 6 month.  4. Coronary artery disease -  CABG 1998, Rayne has had multiple stent procedures following his bypass surgery.  Is not having any significant angina but is definitely limited by DOE - progressive  Cath July, 2017 revealed no significant coronary artery disease-patent grafts. He now has a new left bundle branch block. If his workup is not show any significant abnormalities have a very low threshold to repeat his heart catheter station.  5. Hodgkin's disease - s/p mantal radiation, age 35.   6. Hx of endocarditis in 2014 -  he seems very stable. We found a long filamentous vegetation on his aortic valve. His workup included blood cultures and sedimentation rate which were normal. He has no symptoms. We will continue to monitor. He will need to take SBE prophylaxis prior to any dental procedures.  7.  Radiation Associated CArdiac disease.   His symptoms and signs are strongly suggestive of constrictive pericarditis. His cardiac catheterization from July, 2017 did not show any evidence of constrictive pericarditis. We will repeat the echocardiogram. We'll get a  cardiac MRI. We will consider repeat right and left heart cath  Will also refer him to the advanced heart failure team for further evaluation.  I would have low threshold to repeat his heart cath.    Current medicines are reviewed at length with the patient today.  The patient does not have concerns regarding medicines.  The following changes have been made:  no change   Disposition:   FU with me in 3 months      Signed, Mertie Moores, MD  12/27/2016 9:34 AM    [facility] [address], [place], [place]  [address] Phone: [phone]; Fax: [phone]

## 2016-12-27 NOTE — Patient Instructions (Signed)
Medication Instructions:  Your physician recommends that you continue on your current medications as directed. Please refer to the Current Medication list given to you today.   Labwork: None Ordered   Testing/Procedures: Your physician has requested that you have an echocardiogram. Echocardiography is a painless test that uses sound waves to create images of your heart. It provides your doctor with information about the size and shape of your heart and how well your heart's chambers and valves are working. This procedure takes approximately one hour. There are no restrictions for this procedure.  Your physician has requested that you have a cardiac MRI. Cardiac MRI uses a computer to create images of your heart as its beating, producing both still and moving pictures of your heart and major blood vessels. For further information please visit [email or web address] Please follow the instruction sheet given to you today for more information.   Follow-Up: You have been referred to Heart Failure Clinic  Your physician recommends that you schedule a follow-up appointment in: 3 months with Dr. Acie Fredrickson   If you need a refill on your cardiac medications before your next appointment, please call your pharmacy.   Thank you for choosing [facility]! Christen Bame, RN [phone]

## 2016-12-29 ENCOUNTER — Ambulatory Visit (INDEPENDENT_AMBULATORY_CARE_PROVIDER_SITE_OTHER): Payer: 59 | Admitting: *Deleted

## 2016-12-29 DIAGNOSIS — Z5181 Encounter for therapeutic drug level monitoring: Secondary | ICD-10-CM | POA: Diagnosis not present

## 2016-12-29 DIAGNOSIS — Z954 Presence of other heart-valve replacement: Secondary | ICD-10-CM | POA: Diagnosis not present

## 2016-12-29 DIAGNOSIS — Z952 Presence of prosthetic heart valve: Secondary | ICD-10-CM

## 2016-12-29 LAB — POCT INR: INR: 1.8

## 2016-12-30 ENCOUNTER — Telehealth: Payer: Self-pay | Admitting: Cardiovascular Disease

## 2016-12-30 NOTE — Telephone Encounter (Signed)
Called patient to find out what time of day he wanted his MRI.  Patient wants first available MRI.

## 2016-12-31 ENCOUNTER — Encounter: Payer: Self-pay | Admitting: Cardiovascular Disease

## 2017-01-07 ENCOUNTER — Encounter (HOSPITAL_COMMUNITY): Payer: Self-pay | Admitting: Psychiatry

## 2017-01-07 ENCOUNTER — Ambulatory Visit (INDEPENDENT_AMBULATORY_CARE_PROVIDER_SITE_OTHER): Payer: 59 | Admitting: Psychiatry

## 2017-01-07 VITALS — BP 106/74 | HR 79 | Ht 70.25 in | Wt 195.0 lb

## 2017-01-07 DIAGNOSIS — Z79899 Other long term (current) drug therapy: Secondary | ICD-10-CM | POA: Diagnosis not present

## 2017-01-07 DIAGNOSIS — Z7982 Long term (current) use of aspirin: Secondary | ICD-10-CM | POA: Diagnosis not present

## 2017-01-07 DIAGNOSIS — Z811 Family history of alcohol abuse and dependence: Secondary | ICD-10-CM

## 2017-01-07 DIAGNOSIS — Z818 Family history of other mental and behavioral disorders: Secondary | ICD-10-CM | POA: Diagnosis not present

## 2017-01-07 DIAGNOSIS — F330 Major depressive disorder, recurrent, mild: Secondary | ICD-10-CM | POA: Diagnosis not present

## 2017-01-07 DIAGNOSIS — Z888 Allergy status to other drugs, medicaments and biological substances status: Secondary | ICD-10-CM

## 2017-01-07 DIAGNOSIS — F411 Generalized anxiety disorder: Secondary | ICD-10-CM

## 2017-01-07 NOTE — Progress Notes (Signed)
Psychiatric Initial Adult Assessment   Patient Identification: Mark Choi MRN:  [id number] Date of Evaluation:  01/07/2017 Referral Source: Primary care doctor. Chief Complaint:   Chief Complaint    Anxiety; Depression     Visit Diagnosis:    ICD-9-CM ICD-10-CM   1. Generalized anxiety disorder 300.02 F41.1     History of Present Illness:  Patient is 51 year old Caucasian, self-employed, married man who is referred from Dr. Rachell Cipro because patient is experiencing anxiety and nervousness and he need therapist.  Patient endorse history of depression since he diagnosed diabetes 5 years ago.  At that time he was feeling very depressed, isolated, withdrawn and very anxious about his health.  He was started on Wellbutrin and he was later Lexapro was added.  Patient has multiple health issues and he started to have shortness of breath year ago.  He has noticed that his anxiety has been increase and he is very worried about his health.  He has difficulty climbing upstairs, running around with his 13-year-old son and he gets easily tired and exhausted.  Though he denies any suicidal thoughts or homicidal thought but endorsed some time feeling tired, lack of energy, lack of motivation.  He understand most of the symptoms are due to his health issues.  He scared and very nervous about his future.  He denies any panic attack, anhedonia, hopeless or helpless feeling.  He feels medicine is helping his depression but he like to talk someone on a regular basis.  He is taking multiple medication.  He is also seeing Dr. Floyde Parkins and prescribed gabapentin.  He admitted lately having difficulty falling sleep and he tried over-the-counter with limited response.  Patient denies any mania, psychosis, hallucination, anger, flashbacks, OCD or any PTSD symptoms.  His appetite is fair.  His energy level is fair.  His vital signs are stable.  Patient admitted social drinking but denies any binge, intoxication,  blackouts or any seizures.  He lives with his wife who is very supportive and 40-year-old son.  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  He moved to [place] 2 years ago.  His parent lives in [place].  He has a good relationship with his parents weren't currently visiting him.  He has 2 siblings who lives out of town.  Patient do not recall any side effects from psychiatric medication.  His Lexapro was increased to 20 mg 10 days ago.  Past Psychiatric History: Patient endorse history of depression 5 years ago when he was diagnosed with diabetes.  He denies any history of suicidal attempt, inpatient psychiatric treatment, mania, psychosis or any hallucination.  He used to see therapist many years ago but do not recall the details.  Previous Psychotropic Medications: Yes   Substance Abuse History in the last 12 months:  Yes.    Consequences of Substance Abuse: Negative  Past Medical History:  Past Medical History:  Diagnosis Date  . Allergic rhinitis   . 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 Coronary artery disease   . Depression   . Diabetes mellitus without complication ([place])   . GERD (gastroesophageal reflux disease)   . Hodgkin's disease ([place])    AGE 29 IN REMISSION  . HX: long term anticoagulant use   . Hyperlipidemia   . Impacted cerumen   . Major depression   . MDD (major depressive disorder)    STABLE  . Migraine   . Tachycardia   . Thrombophilia ([place])   .  Vitamin D deficiency     Past Surgical History:  Procedure Laterality Date  . ANGIOPLASTY     5  . ARTIFICIAL AORTIC VALVE     [facility]  . CARDIAC CATHETERIZATION N/A 04/26/2016   Procedure: Right/Left Heart Cath and Coronary/Graft Angiography;  Surgeon: Sherren Mocha, MD;  Location: [facility];  Service: Cardiovascular;  Laterality: N/A;  . CARDIAC SURGERY    . CORONARY ARTERY BYPASS GRAFT    . HEART STENT  2004, 2007   3 STENTS  . LYMPH NODE BIOPSY  1984  . SHUNT REPLACEMENT  1985    Subcutaneous shunt placed and then removed  . THORACOTOMY  2011    Family Psychiatric History: Patient endorse mother , brother has history of alcoholism.  He endorse paternal grand father has history of alcoholism.  Family History:  Family History  Problem Relation Age of Onset  . Breast cancer Mother   . Alcohol abuse Mother   . Diabetes type II Father   . Diverticulitis Father   . Alcoholism Brother   . Depression Brother   . Cancer Maternal Grandmother   . Cancer Maternal Grandfather   . Alcohol abuse Paternal Grandfather   . Heart disease Paternal Grandfather     Social History:   Social History   Social History  . Marital status: Married    Spouse name: N/A  . Number of children: 1  . Years of education: College   Occupational History  . Works at home    Social History Main Topics  . Smoking status: Never Smoker  . Smokeless tobacco: Never Used  . Alcohol use 1.2 oz/week    2 Cans of beer per week     Comment: 4 drinks per week  . Drug use: No  . Sexual activity: Not Currently    Partners: Female     Comment: Married   Other Topics Concern  . None   Social History Narrative   ** Merged History Encounter **   Lives at home w/ his wife and son   Right-handed   Drinks about 4 cups of coffee per day        Additional Social History: Patient born in [place] and raised in [place].  His been married for 10 years.  Together they have 70-year-old son.  Patient moved to [place] 2 years ago.  Allergies:  No Known Allergies  Metabolic Disorder Labs: Recent Results (from the past 2160 hour(s))  POCT INR     Status: None   Collection Time: 10/12/16  8:55 AM  Result Value Ref Range   INR 3.4   POCT INR     Status: None   Collection Time: 11/01/16  8:52 AM  Result Value Ref Range   INR 4.0   POCT INR     Status: None   Collection Time: 11/15/16  8:52 AM  Result Value Ref Range   INR 1.6   POCT INR     Status: None   Collection Time:  11/29/16  8:58 AM  Result Value Ref Range   INR 2.7   POCT INR     Status: None   Collection Time: 12/15/16  9:24 AM  Result Value Ref Range   INR 4.3   POCT INR     Status: None   Collection Time: 12/29/16  9:04 AM  Result Value Ref Range   INR 1.8    No results found for: HGBA1C, MPG No results found  for: PROLACTIN Lab Results  Component Value Date   CHOL 97 (L) 10/16/2015   TRIG 208 (H) 10/16/2015   HDL 27 (L) 10/16/2015   CHOLHDL 3.6 10/16/2015   VLDL 42 (H) 10/16/2015   LDLCALC 28 10/16/2015     Current Medications: Current Outpatient Prescriptions  Medication Sig Dispense Refill  . aspirin 81 MG tablet Take 81 mg by mouth daily.    Marland Kitchen buPROPion (WELLBUTRIN XL) 150 MG 24 hr tablet Take 150 mg by mouth daily.  1  . Canagliflozin-Metformin HCl (INVOKAMET) 50-1000 MG TABS Take 1 tablet by mouth 2 (two) times daily.     Marland Kitchen escitalopram (LEXAPRO) 10 MG tablet Pt is now taking 20 mg daily    . famotidine (PEPCID) 20 MG tablet Take 20 mg by mouth daily.    . furosemide (LASIX) 40 MG tablet Take 1 tablet (40 mg total) by mouth daily. 90 tablet 3  . gabapentin (NEURONTIN) 300 MG capsule Take 1 capsule (300 mg total) by mouth at bedtime. 90 capsule 1  . losartan (COZAAR) 25 MG tablet Take 25 mg by mouth daily.    . metoprolol tartrate (LOPRESSOR) 25 MG tablet Take 50 mg by mouth 2 (two) times daily.    . Multiple Vitamins-Minerals (MENS MULTI VITAMIN & MINERAL) TABS Take 1 tablet by mouth daily.    . nitroGLYCERIN (NITROSTAT) 0.4 MG SL tablet Place 1 tablet (0.4 mg total) under the tongue every 5 (five) minutes as needed for chest pain. 25 tablet 6  . ONETOUCH VERIO test strip 1 strip by Does not apply route as directed.  11  . pantoprazole (PROTONIX) 40 MG tablet Take 1 tablet (40 mg total) by mouth daily. Take 30-60 min before first meal of the day 30 tablet 2  . potassium chloride (K-DUR) 10 MEQ tablet Take 1 tablet (10 mEq total) by mouth daily. 90 tablet 3  . prochlorperazine  (COMPAZINE) 10 MG tablet Take 1 tablet (10 mg total) by mouth 2 (two) times daily as needed for nausea or vomiting (Nausea ). 10 tablet 0  . rosuvastatin (CRESTOR) 40 MG tablet Take 40 mg by mouth daily.    Marland Kitchen warfarin (COUMADIN) 3 MG tablet Take 1 tablet (3 mg total) by mouth as directed. Take 3 mg by mouth as directed by [facility] (Patient taking differently: Take 3 mg by mouth as directed. 3 mg every day except Friday take 5 mg) 120 tablet 1  . Testosterone 12.5 MG/ACT (1%) GEL Apply 12.5 mg topically daily as needed (for hormone therapy).   1   No current facility-administered medications for this visit.     Neurologic: Headache: No Seizure: No Paresthesias:No  Musculoskeletal: Strength & Muscle Tone: within normal limits Gait & Station: normal Patient leans: N/A  Psychiatric Specialty Exam: Review of Systems  Constitutional:       Tired  HENT: Negative.   Respiratory: Positive for shortness of breath.   Skin: Negative.   Neurological: Negative.   Psychiatric/Behavioral: The patient is nervous/anxious.     Blood pressure 106/74, pulse 79, height 5' 10.25" (1.784 m), weight 195 lb (88.5 kg).Body mass index is 27.78 kg/m.  General Appearance: Casual  Eye Contact:  Good  Speech:  Clear and Coherent  Volume:  Normal  Mood:  Anxious  Affect:  Congruent  Thought Process:  Coherent  Orientation:  Full (Time, Place, and Person)  Thought Content:  WDL, Logical and Rumination  Suicidal Thoughts:  No  Homicidal Thoughts:  No  Memory:  Immediate;   Good Recent;   Fair Remote;   Good  Judgement:  Good  Insight:  Good  Psychomotor Activity:  Normal  Concentration:  Concentration: Good and Attention Span: Good  Recall:  Good  Fund of Knowledge:Good  Language: Good  Akathisia:  No  Handed:  Right  AIMS (if indicated):  0  Assets:  Communication Skills Desire for Improvement Housing Resilience Social Support  ADL's:  Intact  Cognition: WNL  Sleep:  Fair      Assessment: Generalized anxiety disorder.  Major depressive disorder, recurrent mild  Plan: I review his symptoms, history, current medication, recent blood work results and collateral information from other providers.  Patient is taking Wellbutrin XL 150 mg daily and Lexapro 20 mg which was recently increased from 10 mg.  Patient endorses anxiety and depression is stable but he like to someone to talk for counseling.  I recommended to see Tharon Aquas for counseling and CBT.  Patient also complaining of poor sleep.  He is taking multiple medication.  I recommended that she should consider increasing gabapentin to 400 mg which can help his residual anxiety and insomnia.  Patient told that he will discuss with his neurologist and upcoming appointment.  He like to continue his current psychiatric medication from his primary care physician.  At this time he has no side effects.  I recommended to call us back if he decided to establish care in this office for medication management.  Discuss safety plan that anytime having active suicidal thoughts or homicidal thoughts and he need to call 911 or go to a local emergency room.  Patient will see Tharon Aquas for counseling.  At this time he does not need to schedule appointment with a psychiatrist as he is getting medication from primary care doctor. Anniya Whiters T., MD 3/30/201811:26 AM

## 2017-01-10 ENCOUNTER — Ambulatory Visit (HOSPITAL_COMMUNITY): Payer: 59 | Attending: Cardiovascular Disease

## 2017-01-10 ENCOUNTER — Other Ambulatory Visit: Payer: Self-pay

## 2017-01-10 ENCOUNTER — Ambulatory Visit (INDEPENDENT_AMBULATORY_CARE_PROVIDER_SITE_OTHER): Payer: 59

## 2017-01-10 DIAGNOSIS — E119 Type 2 diabetes mellitus without complications: Secondary | ICD-10-CM | POA: Diagnosis not present

## 2017-01-10 DIAGNOSIS — Z952 Presence of prosthetic heart valve: Secondary | ICD-10-CM | POA: Insufficient documentation

## 2017-01-10 DIAGNOSIS — I083 Combined rheumatic disorders of mitral, aortic and tricuspid valves: Secondary | ICD-10-CM | POA: Insufficient documentation

## 2017-01-10 DIAGNOSIS — Z5181 Encounter for therapeutic drug level monitoring: Secondary | ICD-10-CM | POA: Diagnosis not present

## 2017-01-10 DIAGNOSIS — I371 Nonrheumatic pulmonary valve insufficiency: Secondary | ICD-10-CM | POA: Insufficient documentation

## 2017-01-10 DIAGNOSIS — R0602 Shortness of breath: Secondary | ICD-10-CM | POA: Insufficient documentation

## 2017-01-10 DIAGNOSIS — I519 Heart disease, unspecified: Secondary | ICD-10-CM

## 2017-01-10 DIAGNOSIS — E785 Hyperlipidemia, unspecified: Secondary | ICD-10-CM | POA: Diagnosis not present

## 2017-01-10 DIAGNOSIS — Z954 Presence of other heart-valve replacement: Secondary | ICD-10-CM

## 2017-01-10 DIAGNOSIS — I2510 Atherosclerotic heart disease of native coronary artery without angina pectoris: Secondary | ICD-10-CM | POA: Insufficient documentation

## 2017-01-10 LAB — POCT INR: INR: 2.5

## 2017-01-12 ENCOUNTER — Ambulatory Visit (HOSPITAL_COMMUNITY)
Admission: RE | Admit: 2017-01-12 | Discharge: 2017-01-12 | Disposition: A | Discharge status: Home / Self Care | Payer: 59 | Source: Ambulatory Visit | Attending: Cardiovascular Disease | Admitting: Cardiovascular Disease

## 2017-01-12 DIAGNOSIS — Z952 Presence of prosthetic heart valve: Secondary | ICD-10-CM | POA: Insufficient documentation

## 2017-01-12 DIAGNOSIS — I519 Heart disease, unspecified: Secondary | ICD-10-CM | POA: Insufficient documentation

## 2017-01-12 DIAGNOSIS — R0602 Shortness of breath: Secondary | ICD-10-CM | POA: Diagnosis present

## 2017-01-12 DIAGNOSIS — I351 Nonrheumatic aortic (valve) insufficiency: Secondary | ICD-10-CM | POA: Insufficient documentation

## 2017-01-12 LAB — CREATININE, SERUM
Creatinine, Ser: 0.99 mg/dL (ref 0.61–1.24)
GFR calc Af Amer: >60 mL/min (ref >60)

## 2017-01-12 MED ORDER — GADOBENATE DIMEGLUMINE 529 MG/ML IV SOLN
30.0000 mL | Freq: Once | INTRAVENOUS | Status: AC
  Administered 2017-01-12: 30 mL via INTRAVENOUS

## 2017-01-14 DIAGNOSIS — I350 Nonrheumatic aortic (valve) stenosis: Secondary | ICD-10-CM | POA: Diagnosis not present

## 2017-01-14 DIAGNOSIS — I2510 Atherosclerotic heart disease of native coronary artery without angina pectoris: Secondary | ICD-10-CM | POA: Diagnosis not present

## 2017-01-17 ENCOUNTER — Telehealth: Payer: Self-pay | Admitting: Nurse Practitioner

## 2017-01-17 DIAGNOSIS — R0602 Shortness of breath: Secondary | ICD-10-CM

## 2017-01-17 DIAGNOSIS — Z952 Presence of prosthetic heart valve: Secondary | ICD-10-CM

## 2017-01-17 NOTE — Telephone Encounter (Signed)
Spoke with Dr. Acie Fredrickson about patient's symptoms and his concern that he continues to have worsening SOB. He advised we order a CPX for further evaluation of heart and lung function. I spoke with patient who verbalized understanding and agreement regarding getting this ordered. I advised that someone will contact him to schedule this test which will be done at [facility]. I advised that once he is scheduled, I will call him back with further instructions. He thanked me for the call.

## 2017-01-19 NOTE — Telephone Encounter (Signed)
Reviewed instructions for CPX which is scheduled for April 16. I advised him to call back with questions or concerns prior to test. He verbalized understanding and agreement and thanked me for the call.

## 2017-01-20 ENCOUNTER — Ambulatory Visit (INDEPENDENT_AMBULATORY_CARE_PROVIDER_SITE_OTHER): Payer: 59 | Admitting: Clinical

## 2017-01-20 ENCOUNTER — Encounter (HOSPITAL_COMMUNITY): Payer: Self-pay | Admitting: Clinical

## 2017-01-20 DIAGNOSIS — F331 Major depressive disorder, recurrent, moderate: Secondary | ICD-10-CM | POA: Diagnosis not present

## 2017-01-20 DIAGNOSIS — F411 Generalized anxiety disorder: Secondary | ICD-10-CM | POA: Diagnosis not present
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
## 2017-01-24 ENCOUNTER — Ambulatory Visit (HOSPITAL_COMMUNITY): Payer: 59 | Attending: Internal Medicine

## 2017-01-24 DIAGNOSIS — R0600 Dyspnea, unspecified: Secondary | ICD-10-CM | POA: Diagnosis present

## 2017-01-24 DIAGNOSIS — Z952 Presence of prosthetic heart valve: Secondary | ICD-10-CM

## 2017-01-24 DIAGNOSIS — R0602 Shortness of breath: Secondary | ICD-10-CM

## 2017-01-25 DIAGNOSIS — R0602 Shortness of breath: Secondary | ICD-10-CM | POA: Diagnosis not present

## 2017-01-28 ENCOUNTER — Encounter (HOSPITAL_COMMUNITY): Payer: Self-pay | Admitting: *Deleted

## 2017-01-28 ENCOUNTER — Encounter (HOSPITAL_COMMUNITY): Payer: Self-pay

## 2017-01-28 ENCOUNTER — Other Ambulatory Visit (HOSPITAL_COMMUNITY): Payer: Self-pay | Admitting: *Deleted

## 2017-01-28 ENCOUNTER — Ambulatory Visit (HOSPITAL_COMMUNITY)
Admission: RE | Admit: 2017-01-28 | Discharge: 2017-01-28 | Disposition: A | Discharge status: Home / Self Care | Payer: 59 | Source: Ambulatory Visit | Attending: Cardiology | Admitting: Cardiology

## 2017-01-28 VITALS — BP 140/72 | HR 65 | Ht 70.5 in | Wt 201.8 lb

## 2017-01-28 DIAGNOSIS — E785 Hyperlipidemia, unspecified: Secondary | ICD-10-CM | POA: Diagnosis not present

## 2017-01-28 DIAGNOSIS — Z9889 Other specified postprocedural states: Secondary | ICD-10-CM | POA: Diagnosis not present

## 2017-01-28 DIAGNOSIS — Z7984 Long term (current) use of oral hypoglycemic drugs: Secondary | ICD-10-CM | POA: Insufficient documentation

## 2017-01-28 DIAGNOSIS — I4589 Other specified conduction disorders: Secondary | ICD-10-CM | POA: Insufficient documentation

## 2017-01-28 DIAGNOSIS — Z951 Presence of aortocoronary bypass graft: Secondary | ICD-10-CM | POA: Diagnosis not present

## 2017-01-28 DIAGNOSIS — Z8571 Personal history of Hodgkin lymphoma: Secondary | ICD-10-CM | POA: Insufficient documentation

## 2017-01-28 DIAGNOSIS — I2583 Coronary atherosclerosis due to lipid rich plaque: Secondary | ICD-10-CM | POA: Diagnosis not present

## 2017-01-28 DIAGNOSIS — K219 Gastro-esophageal reflux disease without esophagitis: Secondary | ICD-10-CM | POA: Insufficient documentation

## 2017-01-28 DIAGNOSIS — I447 Left bundle-branch block, unspecified: Secondary | ICD-10-CM | POA: Insufficient documentation

## 2017-01-28 DIAGNOSIS — R0600 Dyspnea, unspecified: Secondary | ICD-10-CM | POA: Diagnosis not present

## 2017-01-28 DIAGNOSIS — F329 Major depressive disorder, single episode, unspecified: Secondary | ICD-10-CM | POA: Insufficient documentation

## 2017-01-28 DIAGNOSIS — Z7982 Long term (current) use of aspirin: Secondary | ICD-10-CM | POA: Diagnosis not present

## 2017-01-28 DIAGNOSIS — I5032 Chronic diastolic (congestive) heart failure: Secondary | ICD-10-CM | POA: Diagnosis not present

## 2017-01-28 DIAGNOSIS — I311 Chronic constrictive pericarditis: Secondary | ICD-10-CM | POA: Insufficient documentation

## 2017-01-28 DIAGNOSIS — Z7901 Long term (current) use of anticoagulants: Secondary | ICD-10-CM | POA: Diagnosis not present

## 2017-01-28 DIAGNOSIS — I2510 Atherosclerotic heart disease of native coronary artery without angina pectoris: Secondary | ICD-10-CM | POA: Diagnosis not present

## 2017-01-28 DIAGNOSIS — Z952 Presence of prosthetic heart valve: Secondary | ICD-10-CM | POA: Insufficient documentation

## 2017-01-28 DIAGNOSIS — Z954 Presence of other heart-valve replacement: Secondary | ICD-10-CM

## 2017-01-28 DIAGNOSIS — E119 Type 2 diabetes mellitus without complications: Secondary | ICD-10-CM | POA: Insufficient documentation

## 2017-01-28 LAB — CBC
HCT: 39.2 % (ref 39.0–52.0)
HEMOGLOBIN: 12.8 g/dL — AB (ref 13.0–17.0)
MCH: 27.3 pg (ref 26.0–34.0)
MCHC: 32.7 g/dL (ref 30.0–36.0)
MCV: 83.6 fL (ref 78.0–100.0)
PLATELETS: 158 10*3/uL (ref 150–400)
RBC: 4.69 MIL/uL (ref 4.22–5.81)
RDW: 14.8 % (ref 11.5–15.5)
WBC: 16 10*3/uL — ABNORMAL HIGH (ref 4.0–10.5)

## 2017-01-28 LAB — BASIC METABOLIC PANEL
ANION GAP: 8 (ref 5–15)
BUN: 18 mg/dL (ref 6–20)
CALCIUM: 9.2 mg/dL (ref 8.9–10.3)
CO2: 23 mmol/L (ref 22–32)
Chloride: 109 mmol/L (ref 101–111)
Creatinine, Ser: 1.06 mg/dL (ref 0.61–1.24)
GLUCOSE: 214 mg/dL — AB (ref 65–99)
POTASSIUM: 4.4 mmol/L (ref 3.5–5.1)
SODIUM: 140 mmol/L (ref 135–145)

## 2017-01-28 LAB — BRAIN NATRIURETIC PEPTIDE: B Natriuretic Peptide: 155.6 pg/mL — ABNORMAL HIGH (ref 0.0–100.0)

## 2017-01-28 LAB — PROTIME-INR
INR: 3.01
PROTHROMBIN TIME: 31.9 s — AB (ref 11.4–15.2)

## 2017-01-28 MED ORDER — METOPROLOL TARTRATE 25 MG PO TABS: 12.5000 mg | ORAL_TABLET | Freq: Two times a day (BID) | ORAL | Status: DC

## 2017-01-28 NOTE — Patient Instructions (Signed)
Decrease Metoprolol to 12.5 mg (1/2 tab) Twice daily   Labs today  Cardiac MRI, radiology will call you to schedule  High Resolution CT Scan  Right Heart Catheterization, see instruction sheet  Your physician recommends that you schedule a follow-up appointment in: 3 weeks

## 2017-01-29 NOTE — Progress Notes (Addendum)
PCP: Dr. Ernie Hew Cardiology: Dr. Acie Fredrickson HF Cardiology: Dr. Aundra Dubin  51 yo with history of CAD s/p CABG, Hodgkins lymphoma treated with radiation, and mechanical aortic valve was referred by Dr. Acie Fredrickson for evaluation of progressive dyspnea.   Patient had mantle radiation for Hodgkins lymphoma at age 66.  In his 68s, he had CABG (1998).  In 2010, he had mechanical aortic valve replacement.  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    Around 5/17, he began to develop progressive dyspnea.  The dyspnea has slowly worsened since that time.  He also developed chest heaviness.  In 5/17, he had a CT chest showing bronchiectasis.  He had right and left heart cath in 7/17 that showed normal filling pressures and stable CAD (no interventional target).  PFTs in 8/17 showed a moderate restrictive defect. Dyspnea continued to progress, now he is short of breath after walking 30-40 feet.  He has constant chest heaviness. He has orthopnea and has to prop up to sleep.  He is on Lasix, but says that starting this did not seen to help much.  Weight, however, has been rising.    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  Cath in 7/17 was not suggestive of this with completely normal filling pressures.  Cardiac MRI was done in 4/18 but free breathing sequences were not done to assess for ventricular interdependence.  Echo showed septal paradoxical motion but this was in the setting of a LBBB-like IVCD.  Finally CPX in 4/18 actually suggested that the patient has more ventilatory than cardiac limitation, severe restrictive lung physiology.   ECG (3/18, personally reviewed): NSR, LBBB-like IVCD QRS 146 msec (new since 7/17).   Labs (3/18): hgb 14, TSH normal Labs (4/18): creatinine 0.99, BNP 156  PMH: 1. Depression 2. GERD 3. CAD: CABG in 1998 => FH CAD, also possible accelerated atherosclerosis from prior radiation to chest.  - PCI after CABG in  2004 and 2007.  - LHC (7/17): 90% ostial LM, 95% ostial LCx, 90% pLAD, sequential LIMA-D1+mid LAD patent, SVG-OM1 patent with retrograde filling distal LCx and OM2.  RCA ok.  4. Hodgkin's lymphoma: s/p mantle radiation at age 50.  89. Type II diabetes 6. Mechanical aortic valve replacement in 2010, possible endocarditis in 2014 treated medically (abx).  7. Hyperlipidemia 8. Chronic LBBB 9. Chronic exertional dyspnea: Gradually progressive.  - CT chest (5/17): Bronchiectasis - RHC (7/17): mean RA 6, PA 30/11, mean PCWP 9, CI 2.58. - PFTs (8/17): moderate restriction on PFTs.  - Echo (4/18): EF 55-60%, mild LVH, grade II diastolic dysfunction, septal paradox (LBBB), mechanical aortic valve, moderately decreased RV systolic function, PASP 43 mmHg, dilated IVC.  - CPX (4/18): peak VO2 14.9 (43% predicted), VE/VCO2 55, RER 0.97 => submaximal.  However, appears to have severe restrictive lung physiology, he seems to primarily have a ventilatory limitation.  He also was noted to have chronotropic incompetence.   - Cardiac MRI (4/18): EF 64%, no LGE, normal RV size and systolic function, mechanical aortic with mild AI, no evidence for constriction but free-breathing sequences not done.   FH: Male side with MIs, usually in their early 39s.   Social History   Social History  . Marital status: Married    Spouse name: N/A  . Number of children: 1  . Years of education: College   Occupational History  . Works at home    Social History Main Topics  . Smoking status:  Never Smoker  . Smokeless tobacco: Never Used  . Alcohol use 1.2 oz/week    2 Cans of beer per week     Comment: 4 drinks per week  . Drug use: No  . Sexual activity: Not Currently    Partners: Female     Comment: Married   Other Topics Concern  . Not on file   Social History Narrative   ** Merged History Encounter **   Lives at home w/ his wife and son   Right-handed   Drinks about 4 cups of coffee per day       ROS:  All systems reviewed and negative except as per HPI.  Current Outpatient Prescriptions  Medication Sig Dispense Refill  . aspirin 81 MG tablet Take 81 mg by mouth daily.    Marland Kitchen buPROPion (WELLBUTRIN XL) 150 MG 24 hr tablet Take 150 mg by mouth daily.  1  . Canagliflozin-Metformin HCl (INVOKAMET) 50-1000 MG TABS Take 1 tablet by mouth 2 (two) times daily.     Marland Kitchen escitalopram (LEXAPRO) 10 MG tablet Take 10 mg by mouth daily. Pt is now taking 20 mg daily     . famotidine (PEPCID) 20 MG tablet Take 20 mg by mouth daily.    . furosemide (LASIX) 40 MG tablet Take 1 tablet (40 mg total) by mouth daily. 90 tablet 3  . gabapentin (NEURONTIN) 300 MG capsule Take 1 capsule (300 mg total) by mouth at bedtime. 90 capsule 1  . losartan (COZAAR) 25 MG tablet Take 25 mg by mouth at bedtime.     . metoprolol tartrate (LOPRESSOR) 25 MG tablet Take 0.5 tablets (12.5 mg total) by mouth 2 (two) times daily.    . Multiple Vitamins-Minerals (MENS MULTI VITAMIN & MINERAL) TABS Take 1 tablet by mouth daily.    . nitroGLYCERIN (NITROSTAT) 0.4 MG SL tablet Place 1 tablet (0.4 mg total) under the tongue every 5 (five) minutes as needed for chest pain. 25 tablet 6  . potassium chloride (K-DUR) 10 MEQ tablet Take 1 tablet (10 mEq total) by mouth daily. 90 tablet 3  . rosuvastatin (CRESTOR) 40 MG tablet Take 40 mg by mouth at bedtime.     Marland Kitchen warfarin (COUMADIN) 3 MG tablet Take 1 tablet (3 mg total) by mouth as directed. Take 3 mg by mouth as directed by [facility] (Patient taking differently: Take 3-4.5 mg by mouth as directed. Takes 3 mg all days except Monday takes 4.5 mg) 120 tablet 1  . Testosterone 12.5 MG/ACT (1%) GEL Apply 12.5 mg topically daily as needed (for hormone therapy).   1   No current facility-administered medications for this encounter.    BP 140/72 (BP Location: Left Arm, Patient Position: Sitting, Cuff Size: Normal)   Pulse 65   Ht 5' 10.5" (1.791 m)   Wt 201 lb 12.8 oz (91.5 kg)   SpO2 95%   BMI  28.55 kg/m  General: NAD Neck: No JVD, no thyromegaly or thyroid nodule.  Lungs: Clear to auscultation bilaterally with normal respiratory effort. CV: Nondisplaced PMI.  Heart regular mechanical S2, no S3/S4, 2/6 early SEM RUSB. Trace ankle edema.  No carotid bruit.  Normal pedal pulses.  Abdomen: Soft, nontender, no hepatosplenomegaly, mild distention.  Skin: Intact without lesions or rashes.  Neurologic: Alert and oriented x 3.  Psych: Normal affect. Extremities: No clubbing or cyanosis.  HEENT: Normal.   Assessment/Plan: 1. CAD: s/p CABG.  Cath in 7/17 with stable coronaries and grafts.  Patient had  already started to develop dyspnea at this time, so doubt progressive CAD as cause of symptoms.  - Continue ASA 81, statin.  2. Mechanical aortic valve: Normally-functioning on last echo in 4/18.   - Continue ASA 81 + warfarin.  3. Chronic diastolic CHF: Echo in 5/72 with normal EF.  By exam, appears euvolemic and BNP today was only 156.   - Continue current Lasix 40 mg daily.  - Repeat RHC (see below).  4. Exertional dyspnea:  This has been progressive since 5/17.  Etiology is uncertain. Symptoms now NYHA class IIIb.  As above, do not think this is related to progressive coronary disease (angiography after symptoms started showed stable disease with patent grafts).  On exam, he does not appear volume overloaded to me and his BNP is only minimally elevated.  My two major concerns for him are interstitial lung disease/pulmonary fibrosis (possibly radiation-mediated) and constrictive pericarditis (prior CABG, prior radiation).  CPX was most suggestive of a lung problem => restrictive physiology suggests possible pulmonary fibrosis (though CT in 5/17 showed bronchiectasis).  He has paradoxical septal motion on echo with dilated IVC, but the septal abnormality may be all due to LBBB-like IVCD.  Cardiac MRI was done but free breathing sequences to look for ventricular interdependence was not done.   Ideally, would assess for constrictive pericarditis using simultaneous RV and LV pressure tracings.  However, mechanical AVR makes this impossible except by trans-septal access.   - I will repeat a RHC, though sensitivity for constrictive pericarditis will be low in absence of simultaneous LHC, this will allow me to assess for elevated filling pressures contributing to symptoms.   - I will repeat a limited cardiac MRI with free breathing sequences to assess for ventricular interdependence as this was not done on the prior MRI and is needed.   - I will review echo.  - High resolution CT chest to assess for interstitial lung disease.  - Given severe chronotropic incompetence noted on CPX, I will decrease metoprolol to 12.5 mg bid.   Followup in 3 wks.   Loralie Champagne 01/29/2017

## 2017-01-31 ENCOUNTER — Ambulatory Visit (INDEPENDENT_AMBULATORY_CARE_PROVIDER_SITE_OTHER): Payer: 59 | Admitting: *Deleted

## 2017-01-31 DIAGNOSIS — Z954 Presence of other heart-valve replacement: Secondary | ICD-10-CM | POA: Diagnosis not present

## 2017-01-31 DIAGNOSIS — Z952 Presence of prosthetic heart valve: Secondary | ICD-10-CM

## 2017-01-31 DIAGNOSIS — Z5181 Encounter for therapeutic drug level monitoring: Secondary | ICD-10-CM | POA: Diagnosis not present

## 2017-01-31 LAB — POCT INR: INR: 3.6

## 2017-02-02 ENCOUNTER — Encounter (HOSPITAL_COMMUNITY)
Admission: RE | Disposition: A | Discharge status: Home / Self Care | Payer: Self-pay | Source: Ambulatory Visit | Attending: Cardiology

## 2017-02-02 ENCOUNTER — Encounter (HOSPITAL_COMMUNITY): Payer: Self-pay | Admitting: Cardiology

## 2017-02-02 ENCOUNTER — Ambulatory Visit (HOSPITAL_COMMUNITY)
Admission: RE | Admit: 2017-02-02 | Discharge: 2017-02-02 | Disposition: A | Discharge status: Home / Self Care | Payer: 59 | Source: Ambulatory Visit | Attending: Cardiology | Admitting: Cardiology

## 2017-02-02 ENCOUNTER — Telehealth (HOSPITAL_COMMUNITY): Payer: Self-pay | Admitting: *Deleted

## 2017-02-02 ENCOUNTER — Ambulatory Visit (HOSPITAL_BASED_OUTPATIENT_CLINIC_OR_DEPARTMENT_OTHER): Payer: 59

## 2017-02-02 DIAGNOSIS — Z7901 Long term (current) use of anticoagulants: Secondary | ICD-10-CM | POA: Diagnosis not present

## 2017-02-02 DIAGNOSIS — I2510 Atherosclerotic heart disease of native coronary artery without angina pectoris: Secondary | ICD-10-CM | POA: Insufficient documentation

## 2017-02-02 DIAGNOSIS — I447 Left bundle-branch block, unspecified: Secondary | ICD-10-CM | POA: Diagnosis not present

## 2017-02-02 DIAGNOSIS — K861 Other chronic pancreatitis: Secondary | ICD-10-CM | POA: Insufficient documentation

## 2017-02-02 DIAGNOSIS — F329 Major depressive disorder, single episode, unspecified: Secondary | ICD-10-CM | POA: Diagnosis not present

## 2017-02-02 DIAGNOSIS — Z951 Presence of aortocoronary bypass graft: Secondary | ICD-10-CM | POA: Insufficient documentation

## 2017-02-02 DIAGNOSIS — Z8571 Personal history of Hodgkin lymphoma: Secondary | ICD-10-CM | POA: Insufficient documentation

## 2017-02-02 DIAGNOSIS — I110 Hypertensive heart disease with heart failure: Secondary | ICD-10-CM | POA: Insufficient documentation

## 2017-02-02 DIAGNOSIS — E119 Type 2 diabetes mellitus without complications: Secondary | ICD-10-CM | POA: Insufficient documentation

## 2017-02-02 DIAGNOSIS — I311 Chronic constrictive pericarditis: Secondary | ICD-10-CM | POA: Insufficient documentation

## 2017-02-02 DIAGNOSIS — Z7984 Long term (current) use of oral hypoglycemic drugs: Secondary | ICD-10-CM | POA: Insufficient documentation

## 2017-02-02 DIAGNOSIS — E785 Hyperlipidemia, unspecified: Secondary | ICD-10-CM | POA: Diagnosis not present

## 2017-02-02 DIAGNOSIS — I509 Heart failure, unspecified: Secondary | ICD-10-CM | POA: Diagnosis not present

## 2017-02-02 DIAGNOSIS — Z7982 Long term (current) use of aspirin: Secondary | ICD-10-CM | POA: Insufficient documentation

## 2017-02-02 DIAGNOSIS — I5032 Chronic diastolic (congestive) heart failure: Secondary | ICD-10-CM | POA: Insufficient documentation

## 2017-02-02 DIAGNOSIS — Z952 Presence of prosthetic heart valve: Secondary | ICD-10-CM | POA: Diagnosis not present

## 2017-02-02 DIAGNOSIS — K219 Gastro-esophageal reflux disease without esophagitis: Secondary | ICD-10-CM | POA: Insufficient documentation

## 2017-02-02 DIAGNOSIS — Z923 Personal history of irradiation: Secondary | ICD-10-CM | POA: Diagnosis not present

## 2017-02-02 DIAGNOSIS — R0600 Dyspnea, unspecified: Secondary | ICD-10-CM

## 2017-02-02 HISTORY — PX: RIGHT HEART CATH: CATH118263

## 2017-02-02 LAB — GLUCOSE, CAPILLARY
Glucose-Capillary: 126 mg/dL — ABNORMAL HIGH (ref 65–99)
Glucose-Capillary: 141 mg/dL — ABNORMAL HIGH (ref 65–99)

## 2017-02-02 LAB — PROTIME-INR
INR: 1.9
PROTHROMBIN TIME: 22.1 s — AB (ref 11.4–15.2)

## 2017-02-02 LAB — POCT I-STAT 3, VENOUS BLOOD GAS (G3P V)
ACID-BASE DEFICIT: 2 mmol/L (ref 0.0–2.0)
ACID-BASE DEFICIT: 2 mmol/L (ref 0.0–2.0)
Bicarbonate: 22.9 mmol/L (ref 20.0–28.0)
Bicarbonate: 23.2 mmol/L (ref 20.0–28.0)
O2 SAT: 49 %
O2 Saturation: 50 %
PCO2 VEN: 37.8 mmHg — AB (ref 44.0–60.0)
TCO2: 24 mmol/L (ref 0–100)
TCO2: 24 mmol/L (ref 0–100)
pCO2, Ven: 39.5 mmHg — ABNORMAL LOW (ref 44.0–60.0)
pH, Ven: 7.377 (ref 7.250–7.430)
pH, Ven: 7.391 (ref 7.250–7.430)
pO2, Ven: 27 mmHg — CL (ref 32.0–45.0)
pO2, Ven: 27 mmHg — CL (ref 32.0–45.0)

## 2017-02-02 LAB — ECHOCARDIOGRAM LIMITED
Height: 70 in
WEIGHTICAEL: 3107.2 [oz_av]

## 2017-02-02 SURGERY — RIGHT HEART CATH
Anesthesia: LOCAL

## 2017-02-02 MED ORDER — ASPIRIN 81 MG PO CHEW: 81.0000 mg | CHEWABLE_TABLET | ORAL | Status: DC

## 2017-02-02 MED ORDER — SODIUM CHLORIDE 0.9% FLUSH: 3.0000 mL | INTRAVENOUS | Status: DC | PRN

## 2017-02-02 MED ORDER — SODIUM CHLORIDE 0.9% FLUSH: 3.0000 mL | Freq: Two times a day (BID) | INTRAVENOUS | Status: DC

## 2017-02-02 MED ORDER — SODIUM CHLORIDE 0.9 % IV SOLN
INTRAVENOUS | Status: DC
  Administered 2017-02-02: 08:00:00 via INTRAVENOUS

## 2017-02-02 MED ORDER — ONDANSETRON HCL 4 MG/2ML IJ SOLN: 4.0000 mg | Freq: Four times a day (QID) | INTRAMUSCULAR | Status: DC | PRN

## 2017-02-02 MED ORDER — SODIUM CHLORIDE 0.9 % IV SOLN: 250.0000 mL | INTRAVENOUS | Status: DC | PRN

## 2017-02-02 MED ORDER — HEPARIN (PORCINE) IN NACL 2-0.9 UNIT/ML-% IJ SOLN
INTRAMUSCULAR | Status: DC | PRN
  Administered 2017-02-02: 1000 mL

## 2017-02-02 MED ORDER — ACETAMINOPHEN 325 MG PO TABS: 650.0000 mg | ORAL_TABLET | ORAL | Status: DC | PRN

## 2017-02-02 MED ORDER — LIDOCAINE HCL (PF) 1 % IJ SOLN
INTRAMUSCULAR | Status: AC
  Filled 2017-02-02: qty 30

## 2017-02-02 MED ORDER — HEPARIN (PORCINE) IN NACL 2-0.9 UNIT/ML-% IJ SOLN
INTRAMUSCULAR | Status: AC
  Filled 2017-02-02: qty 1000

## 2017-02-02 MED ORDER — LIDOCAINE HCL (PF) 1 % IJ SOLN
INTRAMUSCULAR | Status: DC | PRN
  Administered 2017-02-02: 1 mL

## 2017-02-02 SURGICAL SUPPLY — 10 items
CATH BALLN WEDGE 5F 110CM (CATHETERS) ×2 IMPLANT
GUIDEWIRE .025 260CM (WIRE) ×2 IMPLANT
PACK CARDIAC CATHETERIZATION (CUSTOM PROCEDURE TRAY) ×2 IMPLANT
PROTECTION STATION PRESSURIZED (MISCELLANEOUS) ×2
SHEATH GLIDE SLENDER 4/5FR (SHEATH) ×2 IMPLANT
STATION PROTECTION PRESSURIZED (MISCELLANEOUS) ×1 IMPLANT
TRANSDUCER W/STOPCOCK (MISCELLANEOUS) ×2 IMPLANT
TUBING ART PRESS 72  MALE/FEM (TUBING) ×1
TUBING ART PRESS 72 MALE/FEM (TUBING) ×1 IMPLANT
WIRE EMERALD 3MM-J .025X260CM (WIRE) ×2 IMPLANT

## 2017-02-02 NOTE — Addendum Note (Signed)
Encounter addended by: Larey Dresser, MD on: 02/02/2017  9:18 AM<BR>    Actions taken: Sign clinical note

## 2017-02-02 NOTE — Telephone Encounter (Signed)
Entered in error

## 2017-02-02 NOTE — H&P (View-Only) (Signed)
PCP: Dr. Ernie Hew Cardiology: Dr. Acie Fredrickson HF Cardiology: Dr. Aundra Dubin  51 yo with history of CAD s/p CABG, Hodgkins lymphoma treated with radiation, and mechanical aortic valve was referred by Dr. Acie Fredrickson for evaluation of progressive dyspnea.   Patient had mantle radiation for Hodgkins lymphoma at age 78.  In his 57s, he had CABG (1998).  In 2010, he had mechanical aortic valve replacement.  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    Around 5/17, he began to develop progressive dyspnea.  The dyspnea has slowly worsened since that time.  He also developed chest heaviness.  In 5/17, he had a CT chest showing bronchiectasis.  He had right and left heart cath in 7/17 that showed normal filling pressures and stable CAD (no interventional target).  PFTs in 8/17 showed a moderate restrictive defect. Dyspnea continued to progress, now he is short of breath after walking 30-40 feet.  He has constant chest heaviness. He has orthopnea and has to prop up to sleep.  He is on Lasix, but says that starting this did not seen to help much.  Weight, however, has been rising.    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  Cath in 7/17 was not suggestive of this with completely normal filling pressures.  Cardiac MRI was done in 4/18 but free breathing sequences were not done to assess for ventricular interdependence.  Echo showed septal paradoxical motion but this was in the setting of a LBBB-like IVCD.  Finally CPX in 4/18 actually suggested that the patient has more ventilatory than cardiac limitation, severe restrictive lung physiology.   ECG (3/18, personally reviewed): NSR, LBBB-like IVCD QRS 146 msec (new since 7/17).   Labs (3/18): hgb 14, TSH normal Labs (4/18): creatinine 0.99, BNP 156  PMH: 1. Depression 2. GERD 3. CAD: CABG in 1998 => FH CAD, also possible accelerated atherosclerosis from prior radiation to chest.  - PCI after CABG in  2004 and 2007.  - LHC (7/17): 90% ostial LM, 95% ostial LCx, 90% pLAD, sequential LIMA-D1+mid LAD patent, SVG-OM1 patent with retrograde filling distal LCx and OM2.  RCA ok.  4. Hodgkin's lymphoma: s/p mantle radiation at age 69.  65. Type II diabetes 6. Mechanical aortic valve replacement in 2010, possible endocarditis in 2014 treated medically (abx).  7. Hyperlipidemia 8. Chronic LBBB 9. Chronic exertional dyspnea: Gradually progressive.  - CT chest (5/17): Bronchiectasis - RHC (7/17): mean RA 6, PA 30/11, mean PCWP 9, CI 2.58. - PFTs (8/17): moderate restriction on PFTs.  - Echo (4/18): EF 55-60%, mild LVH, grade II diastolic dysfunction, septal paradox (LBBB), mechanical aortic valve, moderately decreased RV systolic function, PASP 43 mmHg, dilated IVC.  - CPX (4/18): peak VO2 14.9 (43% predicted), VE/VCO2 55, RER 0.97 => submaximal.  However, appears to have severe restrictive lung physiology, he seems to primarily have a ventilatory limitation.  He also was noted to have chronotropic incompetence.   - Cardiac MRI (4/18): EF 64%, no LGE, normal RV size and systolic function, mechanical aortic with mild AI, no evidence for constriction but free-breathing sequences not done.   FH: Male side with MIs, usually in their early 42s.   Social History   Social History  . Marital status: Married    Spouse name: N/A  . Number of children: 1  . Years of education: College   Occupational History  . Works at home    Social History Main Topics  . Smoking status:  Never Smoker  . Smokeless tobacco: Never Used  . Alcohol use 1.2 oz/week    2 Cans of beer per week     Comment: 4 drinks per week  . Drug use: No  . Sexual activity: Not Currently    Partners: Female     Comment: Married   Other Topics Concern  . Not on file   Social History Narrative   ** Merged History Encounter **   Lives at home w/ his wife and son   Right-handed   Drinks about 4 cups of coffee per day       ROS:  All systems reviewed and negative except as per HPI.  Current Outpatient Prescriptions  Medication Sig Dispense Refill  . aspirin 81 MG tablet Take 81 mg by mouth daily.    Marland Kitchen buPROPion (WELLBUTRIN XL) 150 MG 24 hr tablet Take 150 mg by mouth daily.  1  . Canagliflozin-Metformin HCl (INVOKAMET) 50-1000 MG TABS Take 1 tablet by mouth 2 (two) times daily.     Marland Kitchen escitalopram (LEXAPRO) 10 MG tablet Take 10 mg by mouth daily. Pt is now taking 20 mg daily     . famotidine (PEPCID) 20 MG tablet Take 20 mg by mouth daily.    . furosemide (LASIX) 40 MG tablet Take 1 tablet (40 mg total) by mouth daily. 90 tablet 3  . gabapentin (NEURONTIN) 300 MG capsule Take 1 capsule (300 mg total) by mouth at bedtime. 90 capsule 1  . losartan (COZAAR) 25 MG tablet Take 25 mg by mouth at bedtime.     . metoprolol tartrate (LOPRESSOR) 25 MG tablet Take 0.5 tablets (12.5 mg total) by mouth 2 (two) times daily.    . Multiple Vitamins-Minerals (MENS MULTI VITAMIN & MINERAL) TABS Take 1 tablet by mouth daily.    . nitroGLYCERIN (NITROSTAT) 0.4 MG SL tablet Place 1 tablet (0.4 mg total) under the tongue every 5 (five) minutes as needed for chest pain. 25 tablet 6  . potassium chloride (K-DUR) 10 MEQ tablet Take 1 tablet (10 mEq total) by mouth daily. 90 tablet 3  . rosuvastatin (CRESTOR) 40 MG tablet Take 40 mg by mouth at bedtime.     Marland Kitchen warfarin (COUMADIN) 3 MG tablet Take 1 tablet (3 mg total) by mouth as directed. Take 3 mg by mouth as directed by [facility] (Patient taking differently: Take 3-4.5 mg by mouth as directed. Takes 3 mg all days except Monday takes 4.5 mg) 120 tablet 1  . Testosterone 12.5 MG/ACT (1%) GEL Apply 12.5 mg topically daily as needed (for hormone therapy).   1   No current facility-administered medications for this encounter.    BP 140/72 (BP Location: Left Arm, Patient Position: Sitting, Cuff Size: Normal)   Pulse 65   Ht 5' 10.5" (1.791 m)   Wt 201 lb 12.8 oz (91.5 kg)   SpO2 95%   BMI  28.55 kg/m  General: NAD Neck: No JVD, no thyromegaly or thyroid nodule.  Lungs: Clear to auscultation bilaterally with normal respiratory effort. CV: Nondisplaced PMI.  Heart regular mechanical S2, no S3/S4, 2/6 early SEM RUSB. Trace ankle edema.  No carotid bruit.  Normal pedal pulses.  Abdomen: Soft, nontender, no hepatosplenomegaly, mild distention.  Skin: Intact without lesions or rashes.  Neurologic: Alert and oriented x 3.  Psych: Normal affect. Extremities: No clubbing or cyanosis.  HEENT: Normal.   Assessment/Plan: 1. CAD: s/p CABG.  Cath in 7/17 with stable coronaries and grafts.  Patient had  already started to develop dyspnea at this time, so doubt progressive CAD as cause of symptoms.  - Continue ASA 81, statin.  2. Mechanical aortic valve: Normally-functioning on last echo in 4/18.   - Continue ASA 81 + warfarin.  3. Chronic diastolic CHF: Echo in 0/30 with normal EF.  By exam, appears euvolemic and BNP today was only 156.   - Continue current Lasix 40 mg daily.  - Repeat RHC (see below).  4. Exertional dyspnea:  This has been progressive since 5/17.  Etiology is uncertain. Symptoms now NYHA class IIIb.  As above, do not think this is related to progressive coronary disease (angiography after symptoms started showed stable disease with patent grafts).  On exam, he does not appear volume overloaded to me and his BNP is only minimally elevated.  My two major concerns for him are interstitial lung disease/pulmonary fibrosis (possibly radiation-mediated) and constrictive pericarditis (prior CABG, prior radiation).  CPX was most suggestive of a lung problem => restrictive physiology suggests possible pulmonary fibrosis (though CT in 5/17 showed bronchiectasis).  He has paradoxical septal motion on echo with dilated IVC, but the septal abnormality may be all due to LBBB-like IVCD.  Cardiac MRI was done but free breathing sequences to look for ventricular interdependence was not done.   Ideally, would assess for constrictive pericarditis using simultaneous RV and LV pressure tracings.  However, mechanical AVR makes this impossible except by trans-septal access.   - I will repeat a RHC, though sensitivity for constrictive pericarditis will be low in absence of simultaneous LHC, this will allow me to assess for elevated filling pressures contributing to symptoms.   - I will repeat a limited cardiac MRI with free breathing sequences to assess for ventricular interdependence as this was not done on the prior MRI and is needed.   - I will review echo.  - High resolution CT chest to assess for interstitial lung disease.  - Given severe chronotropic incompetence noted on CPX, I will decrease metoprolol to 12.5 mg bid.   Followup in 3 wks.   Loralie Champagne 01/29/2017

## 2017-02-02 NOTE — Interval H&P Note (Signed)
History and Physical Interval Note:  02/02/2017 11:00 AM  Mark Choi  has presented today for surgery, with the diagnosis of sob  The various methods of treatment have been discussed with the patient and family. After consideration of risks, benefits and other options for treatment, the patient has consented to  Procedure(s): Right Heart Cath (N/A) as a surgical intervention .  The patient's history has been reviewed, patient examined, no change in status, stable for surgery.  I have reviewed the patient's chart and labs.  Questions were answered to the patient's satisfaction.     Polk Minor [employer]

## 2017-02-02 NOTE — Discharge Instructions (Signed)
STOP METOPROLOL BECAUSE YOUR HEART RATE IS TOO SLOW. INCREASE LASIX TO 40 MG TWICE DAILY INCREASE POTASSIUM TO 20 MG ONCE A DAY.  Angiogram, Care After This sheet gives you information about how to care for yourself after your procedure. Your health care provider may also give you more specific instructions. If you have problems or questions, contact your health care provider. What can I expect after the procedure? After the procedure, it is common to have bruising and tenderness at the catheter insertion area. Follow these instructions at home: Insertion site care   Follow instructions from your health care provider about how to take care of your insertion site. Make sure you:  Wash your hands with soap and water before you change your bandage (dressing). If soap and water are not available, use hand sanitizer.  Change your dressing as told by your health care provider.  Leave stitches (sutures), skin glue, or adhesive strips in place. These skin closures may need to stay in place for 2 weeks or longer. If adhesive strip edges start to loosen and curl up, you may trim the loose edges. Do not remove adhesive strips completely unless your health care provider tells you to do that.  Do not take baths, swim, or use a hot tub until your health care provider approves.  You may shower 24 hours after the procedure or as told by your health care provider.  Gently wash the site with plain soap and water.  Pat the area dry with a clean towel.  Do not rub the site. This may cause bleeding.  Do not apply powder or lotion to the site. Keep the site clean and dry.  Check your insertion site every day for signs of infection. Check for:  Redness, swelling, or pain.  Fluid or blood.  Warmth.  Pus or a bad smell. Activity   Rest as told by your health care provider, usually for 1-2 days.  Do not lift anything that is heavier than 10 lbs. (4.5 kg) or as told by your health care  provider.  Do not drive for 24 hours if you were given a medicine to help you relax (sedative).  Do not drive or use heavy machinery while taking prescription pain medicine. General instructions   Return to your normal activities as told by your health care provider, usually in about a week. Ask your health care provider what activities are safe for you.  If the catheter site starts bleeding, lie flat and put pressure on the site. If the bleeding does not stop, get help right away. This is a medical emergency.  Drink enough fluid to keep your urine clear or pale yellow. This helps flush the contrast dye from your body.  Take over-the-counter and prescription medicines only as told by your health care provider.  Keep all follow-up visits as told by your health care provider. This is important. Contact a health care provider if:  You have a fever or chills.  You have redness, swelling, or pain around your insertion site.  You have fluid or blood coming from your insertion site.  The insertion site feels warm to the touch.  You have pus or a bad smell coming from your insertion site.  You have bruising around the insertion site.  You notice blood collecting in the tissue around the catheter site (hematoma). The hematoma may be painful to the touch. Get help right away if:  You have severe pain at the catheter insertion area.  The  catheter insertion area swells very fast.  The catheter insertion area is bleeding, and the bleeding does not stop when you hold steady pressure on the area.  The area near or just beyond the catheter insertion site becomes pale, cool, tingly, or numb. These symptoms may represent a serious problem that is an emergency. Do not wait to see if the symptoms will go away. Get medical help right away. Call your local emergency services (911 in the [place] Do not drive yourself to the hospital. Summary  After the procedure, it is common to have bruising and  tenderness at the catheter insertion area.  After the procedure, it is important to rest and drink plenty of fluids.  Do not take baths, swim, or use a hot tub until your health care provider says it is okay to do so. You may shower 24 hours after the procedure or as told by your health care provider.  If the catheter site starts bleeding, lie flat and put pressure on the site. If the bleeding does not stop, get help right away. This is a medical emergency. This information is not intended to replace advice given to you by your health care provider. Make sure you discuss any questions you have with your health care provider. Document Released: 04/15/2005 Document Revised: 09/01/2016 Document Reviewed: 09/01/2016 Elsevier Interactive Patient Education  2017 [employer].

## 2017-02-02 NOTE — Progress Notes (Signed)
ECHO BEING DONE IN ROOM BEFORE DC

## 2017-02-02 NOTE — Progress Notes (Signed)
  Echocardiogram 2D Echocardiogram has been performed.  Mark Choi M 02/02/2017, 12:13 PM

## 2017-02-04 ENCOUNTER — Ambulatory Visit (HOSPITAL_COMMUNITY)
Admission: RE | Admit: 2017-02-04 | Discharge: 2017-02-04 | Disposition: A | Discharge status: Home / Self Care | Payer: 59 | Source: Ambulatory Visit | Attending: Cardiology | Admitting: Cardiology

## 2017-02-04 DIAGNOSIS — Z952 Presence of prosthetic heart valve: Secondary | ICD-10-CM | POA: Diagnosis not present

## 2017-02-04 DIAGNOSIS — I700 Atherosclerosis of aorta: Secondary | ICD-10-CM | POA: Insufficient documentation

## 2017-02-04 DIAGNOSIS — Z951 Presence of aortocoronary bypass graft: Secondary | ICD-10-CM | POA: Insufficient documentation

## 2017-02-04 DIAGNOSIS — Z923 Personal history of irradiation: Secondary | ICD-10-CM | POA: Insufficient documentation

## 2017-02-04 DIAGNOSIS — K8020 Calculus of gallbladder without cholecystitis without obstruction: Secondary | ICD-10-CM | POA: Insufficient documentation

## 2017-02-04 DIAGNOSIS — R0600 Dyspnea, unspecified: Secondary | ICD-10-CM | POA: Diagnosis present

## 2017-02-04 DIAGNOSIS — I2510 Atherosclerotic heart disease of native coronary artery without angina pectoris: Secondary | ICD-10-CM | POA: Diagnosis not present

## 2017-02-04 DIAGNOSIS — R918 Other nonspecific abnormal finding of lung field: Secondary | ICD-10-CM | POA: Insufficient documentation

## 2017-02-04 DIAGNOSIS — Z8571 Personal history of Hodgkin lymphoma: Secondary | ICD-10-CM | POA: Insufficient documentation

## 2017-02-07 ENCOUNTER — Ambulatory Visit (INDEPENDENT_AMBULATORY_CARE_PROVIDER_SITE_OTHER): Payer: 59 | Admitting: Neurology

## 2017-02-07 ENCOUNTER — Encounter: Payer: Self-pay | Admitting: Neurology

## 2017-02-07 VITALS — BP 108/55 | HR 52 | Ht 70.0 in | Wt 191.0 lb

## 2017-02-07 DIAGNOSIS — R202 Paresthesia of skin: Secondary | ICD-10-CM

## 2017-02-07 MED ORDER — GABAPENTIN 300 MG PO CAPS: 600.0000 mg | ORAL_CAPSULE | Freq: Every day | ORAL | 3 refills | Status: AC

## 2017-02-07 NOTE — Progress Notes (Signed)
Reason for visit: Paresthesias, possible peripheral neuropathy  Mark Choi IV is an 51 y.o. male  History of present illness:  Mr. Fredericks is a 51 year old right-handed white male with a history of paresthesias in the feet that are bothersome in the evening hours. EMG and nerve conduction study done did not confirm evidence of a peripheral neuropathy, it is possible he has a small fiber neuropathy. The patient was given a prescription for gabapentin which has been very helpful to control his symptoms in the evening hours, he takes only 300 mg at night. The patient is tolerating the medication well, he denies any significant issues with balance. His main medical issue has been a significant worsening of dyspnea with exertion. The patient is being evaluated for possible pulmonary fibrosis. The patient is quite limited with his ability to perform physical activity secondary to his dyspnea.  Past Medical History:  Diagnosis Date  . Allergic rhinitis   . 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 Coronary artery disease   . Depression   . Diabetes mellitus without complication ([place])   . GERD (gastroesophageal reflux disease)   . Hodgkin's disease ([place])    AGE 27 IN REMISSION  . HX: long term anticoagulant use   . Hyperlipidemia   . Impacted cerumen   . Major depression   . MDD (major depressive disorder)    STABLE  . Migraine   . Tachycardia   . Thrombophilia ([place])   . Vitamin D deficiency     Past Surgical History:  Procedure Laterality Date  . ANGIOPLASTY     4  . ARTIFICIAL AORTIC VALVE     [facility]  . CARDIAC CATHETERIZATION N/A 04/26/2016   Procedure: Right/Left Heart Cath and Coronary/Graft Angiography;  Surgeon: Sherren Mocha, MD;  Location: [facility];  Service: Cardiovascular;  Laterality: N/A;  . CARDIAC SURGERY    . CORONARY ARTERY BYPASS GRAFT    . HEART STENT  2004, 2007   3 STENTS  . LYMPH NODE BIOPSY  1984  . RIGHT HEART CATH N/A 02/02/2017   Procedure: Right Heart Cath;  Surgeon: Larey Dresser, MD;  Location: [facility];  Service: Cardiovascular;  Laterality: N/A;  . SHUNT REPLACEMENT  1985   Subcutaneous shunt placed and then removed  . THORACOTOMY  2011    Family History  Problem Relation Age of Onset  . Breast cancer Mother   . Alcohol abuse Mother   . Diabetes type II Father   . Diverticulitis Father   . Alcoholism Brother   . Depression Brother   . Cancer Maternal Grandmother   . Cancer Maternal Grandfather   . Alcohol abuse Paternal Grandfather   . Heart disease Paternal Grandfather     Social history:  reports that he has never smoked. He has never used smokeless tobacco. He reports that he drinks about 1.2 oz of alcohol per week . He reports that he does not use drugs.   No Known Allergies  Medications:  Prior to Admission medications   Medication Sig Start Date End Date Taking? Authorizing Provider  aspirin 81 MG tablet Take 81 mg by mouth daily.   Yes Historical Provider, MD  buPROPion (WELLBUTRIN XL) 150 MG 24 hr tablet Take 150 mg by mouth daily. 10/03/16  Yes Historical Provider, MD  Canagliflozin-Metformin HCl (INVOKAMET) 50-1000 MG TABS Take 1 tablet by mouth 2 (two) times daily.    Yes Historical Provider, MD  escitalopram (LEXAPRO) 10 MG  tablet Take 10 mg by mouth daily. Pt is now taking 20 mg daily    Yes Historical Provider, MD  famotidine (PEPCID) 20 MG tablet Take 20 mg by mouth daily.   Yes Historical Provider, MD  furosemide (LASIX) 40 MG tablet Take 1 tablet (40 mg total) by mouth daily. Patient taking differently: Take 80 mg by mouth daily.  09/16/16  Yes Thayer Headings, MD  gabapentin (NEURONTIN) 300 MG capsule Take 1 capsule (300 mg total) by mouth at bedtime. 08/20/16  Yes Kathrynn Ducking, MD  losartan (COZAAR) 25 MG tablet Take 25 mg by mouth at bedtime.    Yes Historical Provider, MD  Multiple Vitamins-Minerals (MENS MULTI VITAMIN & MINERAL) TABS Take 1 tablet by mouth daily.    Yes Historical Provider, MD  nitroGLYCERIN (NITROSTAT) 0.4 MG SL tablet Place 1 tablet (0.4 mg total) under the tongue every 5 (five) minutes as needed for chest pain. 10/21/15  Yes Thayer Headings, MD  potassium chloride (K-DUR) 10 MEQ tablet Take 1 tablet (10 mEq total) by mouth daily. Patient taking differently: Take 10 mEq by mouth 2 (two) times daily.  09/16/16  Yes Thayer Headings, MD  rosuvastatin (CRESTOR) 40 MG tablet Take 40 mg by mouth at bedtime.    Yes Historical Provider, MD  Testosterone 12.5 MG/ACT (1%) GEL Apply 12.5 mg topically daily as needed (for hormone therapy).  10/19/14  Yes Historical Provider, MD  warfarin (COUMADIN) 3 MG tablet Take 1 tablet (3 mg total) by mouth as directed. Take 3 mg by mouth as directed by [facility] Patient taking differently: Take 3-4.5 mg by mouth as directed. Takes 3 mg all days except Monday takes 4.5 mg 03/09/16  Yes Thayer Headings, MD    ROS:  Out of a complete 14 system review of symptoms, the patient complains only of the following symptoms, and all other reviewed systems are negative.  Decreased activity, change in appetite, fatigue Shortness of breath, chest tightness Swollen abdomen Restless legs  Blood pressure 120/80, height 5\' 10"  (1.778 m), weight 191 lb (86.6 kg).  Physical Exam  General: The patient is alert and cooperative at the time of the examination.  Skin: No significant peripheral edema is noted.   Neurologic Exam  Mental status: The patient is alert and oriented x 3 at the time of the examination. The patient has apparent normal recent and remote memory, with an apparently normal attention span and concentration ability.   Cranial nerves: Facial symmetry is present. Speech is normal, no aphasia or dysarthria is noted. Extraocular movements are full. Visual fields are full.  Motor: The patient has good strength in all 4 extremities.  Sensory examination: Soft touch sensation is symmetric on the face and  arms, decreased on the left leg as compared to the right.  Coordination: The patient has good finger-nose-finger and heel-to-shin bilaterally.  Gait and station: The patient has a normal gait. Tandem gait is unsteady. Romberg is negative. No drift is seen.  Reflexes: Deep tendon reflexes are symmetric.   Assessment/Plan:  1. 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 He will follow-up in 6 months, sooner if needed. A prescription was sent in for the medication.  Jill Alexanders MD 02/07/2017 8:22 AM  [facility] [address] [place], [place] [address]  Phone [phone] Fax [phone]

## 2017-02-07 NOTE — Patient Instructions (Signed)
We will go up on the gabapentin to 600 mg at night.  Neurontin (gabapentin) may result in drowsiness, ankle swelling, gait instability, or possibly dizziness. Please contact our office if significant side effects occur with this medication.

## 2017-02-08 ENCOUNTER — Ambulatory Visit (INDEPENDENT_AMBULATORY_CARE_PROVIDER_SITE_OTHER): Payer: 59 | Admitting: Clinical

## 2017-02-08 ENCOUNTER — Encounter (HOSPITAL_COMMUNITY): Payer: Self-pay | Admitting: Clinical

## 2017-02-08 DIAGNOSIS — F411 Generalized anxiety disorder: Secondary | ICD-10-CM | POA: Diagnosis not present

## 2017-02-08 DIAGNOSIS — F331 Major depressive disorder, recurrent, moderate: Secondary | ICD-10-CM

## 2017-02-08 DIAGNOSIS — Z950 Presence of cardiac pacemaker: Secondary | ICD-10-CM

## 2017-02-08 HISTORY — DX: Presence of cardiac pacemaker: Z95.0

## 2017-02-08 NOTE — Progress Notes (Signed)
   THERAPIST PROGRESS NOTE  Session Time: 12:15 -1:00   Participation Level: Active  Behavioral Response: CasualAlertDepressed  Type of Therapy: Individual Therapy  Treatment Goals addressed: improve psychiatric symptoms, improve unhelpful thought patterns, healthy coping skill.      Interventions:motivational interviewing, cognitive behavioral therapy, grounding and mindfulness techniques  Summary: Mark Choi is a 51 y.o. male who presents with Major Depressive Disorder, Recurrent, Moderate and Generalized Anxiety Disorder  Suicidal/Homicidal: Nowithout intent/plan  Therapist Response: Mark Choi met with clinician for and individual therapy session. He discussed his psychiatric symptoms, his current life events and his goals for therapy. Mark Choi shared that he is struggling with his phychiatric  symptoms were very challenging right now, exacerbated by physical health symptoms. Clinician asked open ended questions and Mark Choi shared that he had a lot of fear about dying and what it would mean to his child. Clinician asked open ended questions and Mark Choi shared some of his negative automatic thoughts. Client and clinician briefly discussed the thought emotion connection. Clinician explained briefly the process of challenging unhelpful thoughts. Client and clinician agreed to discuss this further in the future. Clinician introduced grounding and mindfulness techniques. Clinician explained the process, purpose and practice of the techniques. Client and clinician practiced the techniques together. Clinician gave him a homework packet on depression which he agreed to complete before next session.   Plan: Return again in 1- 2 weeks.  Diagnosis: Axis I: Major Depressive Disorder, Recurrent, Moderate and Generalized Anxiety Disorder      Carime Dinkel A, LCSW 02/08/2017

## 2017-02-09 ENCOUNTER — Ambulatory Visit (INDEPENDENT_AMBULATORY_CARE_PROVIDER_SITE_OTHER): Payer: 59 | Admitting: *Deleted

## 2017-02-09 DIAGNOSIS — Z954 Presence of other heart-valve replacement: Secondary | ICD-10-CM | POA: Diagnosis not present

## 2017-02-09 DIAGNOSIS — Z952 Presence of prosthetic heart valve: Secondary | ICD-10-CM

## 2017-02-09 DIAGNOSIS — Z5181 Encounter for therapeutic drug level monitoring: Secondary | ICD-10-CM

## 2017-02-09 LAB — POCT INR: INR: 3.1

## 2017-02-14 ENCOUNTER — Ambulatory Visit (INDEPENDENT_AMBULATORY_CARE_PROVIDER_SITE_OTHER): Payer: 59 | Admitting: Clinical

## 2017-02-14 DIAGNOSIS — F331 Major depressive disorder, recurrent, moderate: Secondary | ICD-10-CM | POA: Diagnosis not present

## 2017-02-14 DIAGNOSIS — F411 Generalized anxiety disorder: Secondary | ICD-10-CM | POA: Diagnosis not present

## 2017-02-14 NOTE — Progress Notes (Signed)
   THERAPIST PROGRESS NOTE  Session Time: 10:05 -10:57  Participation Level: Active  Behavioral Response: CasualAlertNA  Type of Therapy: Individual Therapy  Treatment Goals addressed: improve psychiatric symptoms, improve unhelpful thought patterns, healthy coping skill.   Interventions: CBT and Motivational Interviewing  Summary: Miqueas Whilden IV is a 51 y.o. male who presents with major depressive disorder, recurrent moderate and generalized anxiety disorder.   Suicidal/Homicidal: Nowithout intent/plan  Therapist Response: Marcello Moores met with clinician for an individual therapy session. He discussed his psychiatric symptoms and his current life events. He shared his mood has been elevated. He shared that he has been focusing on what he could do to help make his families upcoming move and transition better. He speech was a little pressured and he shared he was not experiencing as many de habilitating thoughts as he had before. Clinician asked open ended questions to evaluate for possible mania. Hosteen shared that he is sleeping well, he has not been doing any risky behaviors. He shared that he has been using the grounding and mindfulness techniques which have been helping him interrupt his negative thought cycles and to focus more on the current moment. Client and clinician discussed how to challenge and change the thoughts after interrupting them. Client and clinician worked on a 7 panel thought record sheet together. He completed his homework packets. Client and clinician reviewed and discussed the packets. Clinician gave him the next packet in the serries which he agreed to complete before next session.   Plan: Return again in 2-3 weeks.  Diagnosis: Axis I: major depressive disorder, recurrent moderate and generalized anxiety disorde        Kaniah Rizzolo A, LCSW 02/14/2017

## 2017-02-18 ENCOUNTER — Encounter (HOSPITAL_COMMUNITY): Payer: Self-pay | Admitting: Clinical

## 2017-02-18 ENCOUNTER — Ambulatory Visit (HOSPITAL_COMMUNITY)
Admission: RE | Admit: 2017-02-18 | Discharge: 2017-02-18 | Disposition: A | Discharge status: Home / Self Care | Payer: 59 | Source: Ambulatory Visit | Attending: Cardiology | Admitting: Cardiology

## 2017-02-18 ENCOUNTER — Encounter (HOSPITAL_COMMUNITY): Payer: Self-pay

## 2017-02-18 ENCOUNTER — Ambulatory Visit (HOSPITAL_COMMUNITY): Admission: RE | Admit: 2017-02-18 | Payer: 59 | Source: Ambulatory Visit

## 2017-02-18 VITALS — BP 112/56 | HR 69 | Ht 70.0 in | Wt 190.2 lb

## 2017-02-18 DIAGNOSIS — F329 Major depressive disorder, single episode, unspecified: Secondary | ICD-10-CM | POA: Diagnosis not present

## 2017-02-18 DIAGNOSIS — I2583 Coronary atherosclerosis due to lipid rich plaque: Secondary | ICD-10-CM

## 2017-02-18 DIAGNOSIS — I519 Heart disease, unspecified: Secondary | ICD-10-CM

## 2017-02-18 DIAGNOSIS — Z7984 Long term (current) use of oral hypoglycemic drugs: Secondary | ICD-10-CM | POA: Insufficient documentation

## 2017-02-18 DIAGNOSIS — E785 Hyperlipidemia, unspecified: Secondary | ICD-10-CM | POA: Diagnosis not present

## 2017-02-18 DIAGNOSIS — Z951 Presence of aortocoronary bypass graft: Secondary | ICD-10-CM | POA: Diagnosis not present

## 2017-02-18 DIAGNOSIS — Z8571 Personal history of Hodgkin lymphoma: Secondary | ICD-10-CM | POA: Diagnosis not present

## 2017-02-18 DIAGNOSIS — I5032 Chronic diastolic (congestive) heart failure: Secondary | ICD-10-CM | POA: Diagnosis not present

## 2017-02-18 DIAGNOSIS — I2510 Atherosclerotic heart disease of native coronary artery without angina pectoris: Secondary | ICD-10-CM | POA: Diagnosis not present

## 2017-02-18 DIAGNOSIS — E119 Type 2 diabetes mellitus without complications: Secondary | ICD-10-CM | POA: Insufficient documentation

## 2017-02-18 DIAGNOSIS — Z7901 Long term (current) use of anticoagulants: Secondary | ICD-10-CM | POA: Insufficient documentation

## 2017-02-18 DIAGNOSIS — Z952 Presence of prosthetic heart valve: Secondary | ICD-10-CM

## 2017-02-18 DIAGNOSIS — R0600 Dyspnea, unspecified: Secondary | ICD-10-CM | POA: Diagnosis present

## 2017-02-18 LAB — BASIC METABOLIC PANEL
Anion gap: 11 (ref 5–15)
BUN: 21 mg/dL — AB (ref 6–20)
CHLORIDE: 104 mmol/L (ref 101–111)
CO2: 26 mmol/L (ref 22–32)
CREATININE: 1.46 mg/dL — AB (ref 0.61–1.24)
Calcium: 9.7 mg/dL (ref 8.9–10.3)
GFR calc Af Amer: >60 mL/min (ref >60)
GFR calc non Af Amer: 54 mL/min — ABNORMAL LOW (ref >60)
GLUCOSE: 106 mg/dL — AB (ref 65–99)
POTASSIUM: 3.8 mmol/L (ref 3.5–5.1)
SODIUM: 141 mmol/L (ref 135–145)

## 2017-02-18 MED ORDER — FUROSEMIDE 40 MG PO TABS: 60.0000 mg | ORAL_TABLET | Freq: Every day | ORAL | 3 refills | Status: DC

## 2017-02-18 NOTE — Patient Instructions (Signed)
Increase Furosemide to 60 mg (1 & 1/2 tabs) Twice daily   Labs today  Labs in 10 days  Your physician recommends that you schedule a follow-up appointment in: 3 weeks

## 2017-02-20 NOTE — Progress Notes (Signed)
PCP: Dr. Ernie Hew Cardiology: Dr. Acie Fredrickson HF Cardiology: Dr. Aundra Dubin  52 yo with history of CAD s/p CABG, Hodgkins lymphoma treated with radiation, and mechanical aortic valve was referred by Dr. Acie Fredrickson for evaluation of progressive dyspnea.   Patient had mantle radiation for Hodgkins lymphoma at age 20.  In his 47s, he had CABG (1998).  In 2010, he had mechanical aortic valve replacement.  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    Around 5/17, he began to develop progressive dyspnea.  The dyspnea has slowly worsened since that time.  He also developed chest heaviness.  In 5/17, he had a CT chest showing bronchiectasis.  He had right and left heart cath in 7/17 that showed normal filling pressures and stable CAD (no interventional target).  PFTs in 8/17 showed a moderate restrictive defect. Dyspnea continued to progress, now he is short of breath after walking 30-40 feet.  He has constant chest heaviness. He has orthopnea and has to prop up to sleep.    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  Cath in 7/17 was not suggestive of this with completely normal filling pressures.  CPX in 4/18 actually suggested that the patient has more ventilatory than cardiac limitation, severe restrictive lung physiology.  Echo from 4/18 was reviewed, there was some pericardial thickening towards the apex and there appeared to be respirophasic variation of the interventricular septum beyond the septal bounce that would be expected with LBBB. Mitral E velocity was too low to accurately comment. RHC was repeated in 4/18. Filling pressures were elevated.  Also, there were some features concerning for constrictive pericarditis.  However, unable to cross aortic valve (mechanical) for simultaneous LV/RV measurement.  Cardiac MRI from 4/18 showed possible ventricular interdependence on the free-breathing sequences.  Finally, high resolution CT showed diffuse  ground glass in the lungs, possibly from chronic lymphatic congestion post-radiation.  There was no pulmonary fibrosis noted. There did appear to be some pericardial thickening.   After cath, I increased Mr Bruhn's Lasix.  His weight is down 11 lbs but breathing really has not changed much compared to the description above. He is still short of breath walking 40 feet and still orthopneic.  He has to stop about 2/3 of the way up a flight of steps.  Of note, his wife needs to move to [place] for her work.   ECG (3/18): NSR, LBBB-like IVCD QRS 146 msec (new since 7/17).   Labs (3/18): hgb 14, TSH normal Labs (4/18): creatinine 0.99 => 1.06, BNP 156  PMH: 1. Depression 2. GERD 3. CAD: CABG in 1998 => FH CAD, also possible accelerated atherosclerosis from prior radiation to chest.  - PCI after CABG in 2004 and 2007.  - LHC (7/17): 90% ostial LM, 95% ostial LCx, 90% pLAD, sequential LIMA-D1+mid LAD patent, SVG-OM1 patent with retrograde filling distal LCx and OM2.  RCA ok.  4. Hodgkin's lymphoma: s/p mantle radiation at age 77.  70. Type II diabetes 6. Mechanical aortic valve replacement in 2010, possible endocarditis in 2014 treated medically (abx).  7. Hyperlipidemia 8. Chronic LBBB 9. Chronic exertional dyspnea: Gradually progressive.  - CT chest (5/17): Bronchiectasis - RHC (7/17): mean RA 6, PA 30/11, mean PCWP 9, CI 2.58. - PFTs (8/17): moderate restriction on PFTs.  - Echo (4/18): EF 55-60%, mild LVH, grade II diastolic dysfunction, septal paradox (LBBB), mechanical aortic valve, moderately decreased RV systolic function, PASP 43 mmHg, dilated IVC.  - CPX (  4/18): peak VO2 14.9 (43% predicted), VE/VCO2 55, RER 0.97 => submaximal.  However, appears to have severe restrictive lung physiology, he seems to primarily have a ventilatory limitation.  He also was noted to have chronotropic incompetence.   - Cardiac MRI (4/18): EF 64%, no LGE, normal RV size and systolic function, mechanical aortic  with mild AI.  I had free breathing images added on, these were concerning for ventricular interdependence with leftwards septal shift with inspiration.  - RHC (4/18, unable to cross aortic valve due to mechanical AVR): mean RA 15 with prominent y descent noted at times but variable, RV 53/19 with dip and plateau pattern present at times but variable, PA 50/25 mean 33, mean PCWP 27 with prominent v-waves, CI 1.8.  - Echo (4/18): EF 60-65%, septal bounce from LBBB but also some respirophasic variation in septal position was noted.  RV normal.  Mitral valve E wave poorly visualized so hard to comment on respirometry across the mitral valve. Pericardial thickening near apex noted. - High resolution CT chest (4/18): diffuse ground glass infiltrates in lungs.  ?chronic lymphatic congestion from radiation.  This does not look like pulmonary fibrosis.  He has a right-sided fibrothorax.  Finally, thickened pericardium (without calcification) was noted.    FH: Male side with MIs, usually in their early 22s.   Social History   Social History  . Marital status: Married    Spouse name: N/A  . Number of children: 1  . Years of education: College   Occupational History  . Works at home    Social History Main Topics  . Smoking status: Never Smoker  . Smokeless tobacco: Never Used  . Alcohol use 1.2 oz/week    2 Cans of beer per week     Comment: 4 drinks per week  . Drug use: No  . Sexual activity: Not Currently    Partners: Female     Comment: Married   Other Topics Concern  . Not on file   Social History Narrative   ** Merged History Encounter **   Lives at home w/ his wife and son   Right-handed   Drinks about 4 cups of coffee per day       ROS: All systems reviewed and negative except as per HPI.  Current Outpatient Prescriptions  Medication Sig Dispense Refill  . aspirin 81 MG tablet Take 81 mg by mouth daily.    Marland Kitchen buPROPion (WELLBUTRIN XL) 150 MG 24 hr tablet Take 150 mg by mouth  daily.  1  . Canagliflozin-Metformin HCl (INVOKAMET) 50-1000 MG TABS Take 1 tablet by mouth 2 (two) times daily.     Marland Kitchen escitalopram (LEXAPRO) 10 MG tablet Take 10 mg by mouth daily. Pt is now taking 20 mg daily     . famotidine (PEPCID) 20 MG tablet Take 20 mg by mouth daily.    . furosemide (LASIX) 40 MG tablet Take 1.5 tablets (60 mg total) by mouth daily. 90 tablet 3  . gabapentin (NEURONTIN) 300 MG capsule Take 2 capsules (600 mg total) by mouth at bedtime. 180 capsule 3  . losartan (COZAAR) 25 MG tablet Take 25 mg by mouth at bedtime.     . Multiple Vitamins-Minerals (MENS MULTI VITAMIN & MINERAL) TABS Take 1 tablet by mouth daily.    . nitroGLYCERIN (NITROSTAT) 0.4 MG SL tablet Place 1 tablet (0.4 mg total) under the tongue every 5 (five) minutes as needed for chest pain. 25 tablet 6  . potassium chloride (  K-DUR) 10 MEQ tablet Take 1 tablet (10 mEq total) by mouth daily. (Patient taking differently: Take 10 mEq by mouth 2 (two) times daily. ) 90 tablet 3  . rosuvastatin (CRESTOR) 40 MG tablet Take 40 mg by mouth at bedtime.     . Testosterone 12.5 MG/ACT (1%) GEL Apply 12.5 mg topically daily as needed (for hormone therapy).   1  . warfarin (COUMADIN) 3 MG tablet Take 1 tablet (3 mg total) by mouth as directed. Take 3 mg by mouth as directed by [facility] (Patient taking differently: Take 3-4.5 mg by mouth as directed. Takes 3 mg all days except Monday takes 4.5 mg) 120 tablet 1   No current facility-administered medications for this encounter.    BP (!) 112/56   Pulse 69   Ht 5\' 10"  (1.778 m)   Wt 190 lb 4 oz (86.3 kg)   SpO2 96%   BMI 27.30 kg/m  General: NAD Neck: JVP 8-9 cm with HJR, no thyromegaly or thyroid nodule.  Lungs: Clear to auscultation bilaterally with normal respiratory effort. CV: Nondisplaced PMI.  Heart regular mechanical S2, no S3/S4, 2/6 early SEM RUSB. No edema.  No carotid bruit.  Normal pedal pulses.  Abdomen: Soft, nontender, no hepatosplenomegaly, mild  distention.  Skin: Intact without lesions or rashes.  Neurologic: Alert and oriented x 3.  Psych: Normal affect. Extremities: No clubbing or cyanosis.  HEENT: Normal.   Assessment/Plan: 1. CAD: s/p CABG.  Cath in 7/17 with stable coronaries and grafts.  Patient had already started to develop dyspnea at this time, so doubt progressive CAD as cause of symptoms.  - Continue ASA 81, statin.  2. Mechanical aortic valve: Normally-functioning on last echo in 4/18.   - Continue ASA 81 + warfarin.  3. Chronic diastolic CHF: Echo in 4/76 with normal EF.  Elevated filling pressures on 4/18 cardiac cath.  He remains volume overloaded on exam today but weight is down 11 lbs with increased Lasix.  - Increase Lasix to 60 mg bid.  BMET/BNP today and repeat in 10 days.  4. Exertional dyspnea:  This has been progressive since 5/17.  Etiology is uncertain. Symptoms now NYHA class IIIb.  As above, do not think this is related to progressive coronary disease (angiography after symptoms started showed stable disease with patent grafts).  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  CPX was most suggestive of a lung problem => restrictive physiology suggests possible pulmonary fibrosis.  However, high resolution CT chest in 4/18 showed ground glass infiltrates that may be due to chronic lymphatic congestion but not pulmonary fibrosis.  Echo and cardiac MRI free breathing sequences were both concerning for ventricular interdependence with significant septal shift noted with inspiration.  There does appear to be some pericardial thickening on echo (towards apex) and on high resolution chest CT.  RHC was done, showing elevated filling pressures and some features that could be consistent with pericardial constriction (dip and plateau RV filling pattern and prominent y  descent on RA tracing).  However, similar findings could be seen with restrictive cardiomyopathy.  Unfortunately, given mechanical aortic valve, I was unable to cross into the left ventricle to get simultaneous LV and RV tracings.  - Given the totality of evidence, I am concerned for constrictive pericarditis.  I will review with review with my colleagues and a cardiac surgeon.  He has lost considerable weight with diuresis  but has not felt better, I am concerned that he may need pericardial stripping.  5. Given severe chronotropic incompetence noted on CPX, I will decreased metoprolol to 12.5 mg bid. HR higher today.   Followup in 3 wks.   Loralie Champagne 02/20/2017

## 2017-02-22 ENCOUNTER — Ambulatory Visit (HOSPITAL_COMMUNITY): Payer: Self-pay | Admitting: Clinical

## 2017-02-23 ENCOUNTER — Ambulatory Visit (INDEPENDENT_AMBULATORY_CARE_PROVIDER_SITE_OTHER): Payer: 59 | Admitting: *Deleted

## 2017-02-23 DIAGNOSIS — Z5181 Encounter for therapeutic drug level monitoring: Secondary | ICD-10-CM | POA: Diagnosis not present

## 2017-02-23 DIAGNOSIS — Z954 Presence of other heart-valve replacement: Secondary | ICD-10-CM

## 2017-02-23 DIAGNOSIS — Z952 Presence of prosthetic heart valve: Secondary | ICD-10-CM

## 2017-02-23 LAB — POCT INR: INR: 3

## 2017-02-24 ENCOUNTER — Telehealth (HOSPITAL_COMMUNITY): Payer: Self-pay | Admitting: Cardiology

## 2017-02-24 NOTE — Telephone Encounter (Signed)
I called Mark Choi to discuss repeat cath with trans-septal puncture for simultaneous LV and RV pressures for confirmation of pericardial constriction prior to considering surgery.  He has a mechanical aortic valve so cannot enter LV retrograde.  He will need to hold coumadin with Lovenox bridge prior to procedure, to be done by Dr. Burt Knack.

## 2017-02-25 ENCOUNTER — Ambulatory Visit (INDEPENDENT_AMBULATORY_CARE_PROVIDER_SITE_OTHER): Payer: 59 | Admitting: *Deleted

## 2017-02-25 DIAGNOSIS — Z952 Presence of prosthetic heart valve: Secondary | ICD-10-CM

## 2017-02-25 DIAGNOSIS — Z954 Presence of other heart-valve replacement: Secondary | ICD-10-CM | POA: Diagnosis not present

## 2017-02-25 DIAGNOSIS — Z5181 Encounter for therapeutic drug level monitoring: Secondary | ICD-10-CM

## 2017-02-25 LAB — POCT INR: INR: 2.8

## 2017-02-25 MED ORDER — ENOXAPARIN SODIUM 80 MG/0.8ML ~~LOC~~ SOLN: 80.0000 mg | Freq: Two times a day (BID) | SUBCUTANEOUS | 0 refills | Status: DC

## 2017-02-25 NOTE — Patient Instructions (Signed)
May 18th : Last dose of Coumadin.  May 19th : No Coumadin or Lovenox.  May 20th : Inject Lovenox 80mg  in the fatty abdominal tissue at least 2 inches from the belly button twice a day about 12 hours apart, 8am and 8pm rotate sites. No Coumadin.  May 21st : Inject Lovenox in the fatty tissue every 12 hours, 8am and 8pm. No Coumadin.  May 22nd : Inject Lovenox in the fatty tissue every 12 hours, 8am and 8pm. No Coumadin.  May 23rd : Inject Lovenox in the fatty tissue in the morning at 8 am (No PM dose). No Coumadin.  May 24th : Procedure Day - No Lovenox  No coumadin - Please ask MD doing procedure when to restart coumadin and Lovenox and when instructed to restart   Restart at some dose Coumadin 3mg  daily and Lovenox 80 mg q 12 hours and continue to take coumadin and lovenox until returns for office appt  on May 30th

## 2017-02-28 ENCOUNTER — Ambulatory Visit (HOSPITAL_COMMUNITY)
Admission: RE | Admit: 2017-02-28 | Discharge: 2017-02-28 | Disposition: A | Discharge status: Home / Self Care | Payer: 59 | Source: Ambulatory Visit | Attending: Cardiology | Admitting: Cardiology

## 2017-02-28 DIAGNOSIS — R0600 Dyspnea, unspecified: Secondary | ICD-10-CM

## 2017-02-28 LAB — BASIC METABOLIC PANEL
Anion gap: 11 (ref 5–15)
BUN: 22 mg/dL — AB (ref 6–20)
CHLORIDE: 104 mmol/L (ref 101–111)
CO2: 26 mmol/L (ref 22–32)
Calcium: 9.5 mg/dL (ref 8.9–10.3)
Creatinine, Ser: 1.35 mg/dL — ABNORMAL HIGH (ref 0.61–1.24)
GFR calc Af Amer: >60 mL/min (ref >60)
GFR, EST NON AFRICAN AMERICAN: 59 mL/min — AB (ref >60)
GLUCOSE: 144 mg/dL — AB (ref 65–99)
Potassium: 3.7 mmol/L (ref 3.5–5.1)
SODIUM: 141 mmol/L (ref 135–145)

## 2017-02-28 LAB — BRAIN NATRIURETIC PEPTIDE: B NATRIURETIC PEPTIDE 5: 175.7 pg/mL — AB (ref 0.0–100.0)

## 2017-02-28 NOTE — Addendum Note (Signed)
Encounter addended by: Louann Liv, LCSW on: 02/28/2017 11:07 AM<BR>    Actions taken: Sign clinical note

## 2017-02-28 NOTE — Progress Notes (Signed)
CSw met with patient in the clinic per his request. Patient asked about process for applying for disability. Patient reports he is good with computers and plans to apply online to [employer]. CSW reviewed process and patient verbalizes understanding of follow up. CSW provided business card for future need. Raquel Sarna, [place], [place]

## 2017-03-01 ENCOUNTER — Encounter (HOSPITAL_COMMUNITY): Payer: Self-pay | Admitting: *Deleted

## 2017-03-02 ENCOUNTER — Other Ambulatory Visit: Payer: Self-pay | Admitting: Cardiovascular Disease

## 2017-03-02 DIAGNOSIS — R0600 Dyspnea, unspecified: Secondary | ICD-10-CM

## 2017-03-02 DIAGNOSIS — R0609 Other forms of dyspnea: Secondary | ICD-10-CM

## 2017-03-03 ENCOUNTER — Inpatient Hospital Stay (HOSPITAL_COMMUNITY)
Admission: RE | Disposition: A | Discharge status: Home / Self Care | Payer: Self-pay | Source: Ambulatory Visit | Attending: Cardiology

## 2017-03-03 ENCOUNTER — Encounter (HOSPITAL_COMMUNITY)
Admission: RE | Disposition: A | Discharge status: Home / Self Care | Payer: Self-pay | Source: Ambulatory Visit | Attending: Cardiology

## 2017-03-03 ENCOUNTER — Inpatient Hospital Stay (HOSPITAL_COMMUNITY)
Admission: RE | Admit: 2017-03-03 | Discharge: 2017-03-05 | DRG: 242 | Disposition: A | Discharge status: Home / Self Care | Payer: 59 | Source: Ambulatory Visit | Attending: Cardiology | Admitting: Cardiology

## 2017-03-03 ENCOUNTER — Other Ambulatory Visit: Payer: Self-pay

## 2017-03-03 DIAGNOSIS — Z79899 Other long term (current) drug therapy: Secondary | ICD-10-CM

## 2017-03-03 DIAGNOSIS — R0600 Dyspnea, unspecified: Secondary | ICD-10-CM | POA: Diagnosis present

## 2017-03-03 DIAGNOSIS — I4589 Other specified conduction disorders: Secondary | ICD-10-CM | POA: Diagnosis present

## 2017-03-03 DIAGNOSIS — Z833 Family history of diabetes mellitus: Secondary | ICD-10-CM | POA: Diagnosis not present

## 2017-03-03 DIAGNOSIS — I440 Atrioventricular block, first degree: Principal | ICD-10-CM | POA: Diagnosis present

## 2017-03-03 DIAGNOSIS — I4430 Unspecified atrioventricular block: Secondary | ICD-10-CM | POA: Diagnosis not present

## 2017-03-03 DIAGNOSIS — Z955 Presence of coronary angioplasty implant and graft: Secondary | ICD-10-CM | POA: Diagnosis not present

## 2017-03-03 DIAGNOSIS — E785 Hyperlipidemia, unspecified: Secondary | ICD-10-CM | POA: Diagnosis present

## 2017-03-03 DIAGNOSIS — Z950 Presence of cardiac pacemaker: Secondary | ICD-10-CM

## 2017-03-03 DIAGNOSIS — E559 Vitamin D deficiency, unspecified: Secondary | ICD-10-CM | POA: Diagnosis present

## 2017-03-03 DIAGNOSIS — I2510 Atherosclerotic heart disease of native coronary artery without angina pectoris: Secondary | ICD-10-CM | POA: Diagnosis present

## 2017-03-03 DIAGNOSIS — I447 Left bundle-branch block, unspecified: Secondary | ICD-10-CM | POA: Diagnosis present

## 2017-03-03 DIAGNOSIS — K219 Gastro-esophageal reflux disease without esophagitis: Secondary | ICD-10-CM | POA: Diagnosis present

## 2017-03-03 DIAGNOSIS — I441 Atrioventricular block, second degree: Secondary | ICD-10-CM

## 2017-03-03 DIAGNOSIS — Z7901 Long term (current) use of anticoagulants: Secondary | ICD-10-CM | POA: Diagnosis not present

## 2017-03-03 DIAGNOSIS — Z8249 Family history of ischemic heart disease and other diseases of the circulatory system: Secondary | ICD-10-CM | POA: Diagnosis not present

## 2017-03-03 DIAGNOSIS — Z818 Family history of other mental and behavioral disorders: Secondary | ICD-10-CM

## 2017-03-03 DIAGNOSIS — I311 Chronic constrictive pericarditis: Secondary | ICD-10-CM | POA: Diagnosis present

## 2017-03-03 DIAGNOSIS — Z8571 Personal history of Hodgkin lymphoma: Secondary | ICD-10-CM | POA: Diagnosis not present

## 2017-03-03 DIAGNOSIS — E119 Type 2 diabetes mellitus without complications: Secondary | ICD-10-CM | POA: Diagnosis present

## 2017-03-03 DIAGNOSIS — R0609 Other forms of dyspnea: Secondary | ICD-10-CM

## 2017-03-03 DIAGNOSIS — Z7982 Long term (current) use of aspirin: Secondary | ICD-10-CM | POA: Diagnosis not present

## 2017-03-03 DIAGNOSIS — I425 Other restrictive cardiomyopathy: Secondary | ICD-10-CM | POA: Diagnosis present

## 2017-03-03 DIAGNOSIS — Z951 Presence of aortocoronary bypass graft: Secondary | ICD-10-CM | POA: Diagnosis not present

## 2017-03-03 DIAGNOSIS — T82128A Displacement of other cardiac electronic device, initial encounter: Secondary | ICD-10-CM | POA: Diagnosis not present

## 2017-03-03 DIAGNOSIS — R001 Bradycardia, unspecified: Secondary | ICD-10-CM | POA: Insufficient documentation

## 2017-03-03 DIAGNOSIS — Y712 Prosthetic and other implants, materials and accessory cardiovascular devices associated with adverse incidents: Secondary | ICD-10-CM | POA: Diagnosis not present

## 2017-03-03 DIAGNOSIS — Z952 Presence of prosthetic heart valve: Secondary | ICD-10-CM

## 2017-03-03 DIAGNOSIS — F329 Major depressive disorder, single episode, unspecified: Secondary | ICD-10-CM | POA: Diagnosis present

## 2017-03-03 DIAGNOSIS — I5033 Acute on chronic diastolic (congestive) heart failure: Secondary | ICD-10-CM | POA: Diagnosis present

## 2017-03-03 DIAGNOSIS — I110 Hypertensive heart disease with heart failure: Secondary | ICD-10-CM | POA: Diagnosis present

## 2017-03-03 DIAGNOSIS — I519 Heart disease, unspecified: Secondary | ICD-10-CM | POA: Diagnosis present

## 2017-03-03 DIAGNOSIS — Z7984 Long term (current) use of oral hypoglycemic drugs: Secondary | ICD-10-CM

## 2017-03-03 DIAGNOSIS — T82120A Displacement of cardiac electrode, initial encounter: Secondary | ICD-10-CM | POA: Diagnosis not present

## 2017-03-03 DIAGNOSIS — Z923 Personal history of irradiation: Secondary | ICD-10-CM | POA: Diagnosis not present

## 2017-03-03 DIAGNOSIS — R0602 Shortness of breath: Secondary | ICD-10-CM | POA: Diagnosis present

## 2017-03-03 HISTORY — DX: Other restrictive cardiomyopathy: I42.5

## 2017-03-03 HISTORY — PX: RIGHT HEART CATH: CATH118263

## 2017-03-03 HISTORY — DX: Conduction disorder, unspecified: I45.9

## 2017-03-03 HISTORY — DX: Presence of cardiac pacemaker: Z95.0

## 2017-03-03 HISTORY — PX: PACEMAKER IMPLANT: EP1218

## 2017-03-03 LAB — BASIC METABOLIC PANEL
Anion gap: 8 (ref 5–15)
BUN: 19 mg/dL (ref 6–20)
CO2: 28 mmol/L (ref 22–32)
CREATININE: 1.19 mg/dL (ref 0.61–1.24)
Calcium: 9.1 mg/dL (ref 8.9–10.3)
Chloride: 104 mmol/L (ref 101–111)
GFR calc Af Amer: >60 mL/min (ref >60)
GLUCOSE: 132 mg/dL — AB (ref 65–99)
POTASSIUM: 3.7 mmol/L (ref 3.5–5.1)
Sodium: 140 mmol/L (ref 135–145)

## 2017-03-03 LAB — POCT I-STAT 3, VENOUS BLOOD GAS (G3P V)
ACID-BASE EXCESS: 4 mmol/L — AB (ref 0.0–2.0)
Acid-Base Excess: 5 mmol/L — ABNORMAL HIGH (ref 0.0–2.0)
Bicarbonate: 28.6 mmol/L — ABNORMAL HIGH (ref 20.0–28.0)
Bicarbonate: 31.3 mmol/L — ABNORMAL HIGH (ref 20.0–28.0)
O2 Saturation: 65 %
O2 Saturation: 99 %
PCO2 VEN: 50.6 mmHg (ref 44.0–60.0)
PH VEN: 7.399 (ref 7.250–7.430)
PH VEN: 7.426 (ref 7.250–7.430)
PO2 VEN: 124 mmHg — AB (ref 32.0–45.0)
TCO2: 30 mmol/L (ref 0–100)
TCO2: 33 mmol/L (ref 0–100)
pCO2, Ven: 43.5 mmHg — ABNORMAL LOW (ref 44.0–60.0)
pO2, Ven: 34 mmHg (ref 32.0–45.0)

## 2017-03-03 LAB — CBC
HCT: 36.6 % — ABNORMAL LOW (ref 39.0–52.0)
Hemoglobin: 11.9 g/dL — ABNORMAL LOW (ref 13.0–17.0)
MCH: 27.4 pg (ref 26.0–34.0)
MCHC: 32.5 g/dL (ref 30.0–36.0)
MCV: 84.1 fL (ref 78.0–100.0)
PLATELETS: 120 10*3/uL — AB (ref 150–400)
RBC: 4.35 MIL/uL (ref 4.22–5.81)
RDW: 14.2 % (ref 11.5–15.5)
WBC: 12.3 10*3/uL — ABNORMAL HIGH (ref 4.0–10.5)

## 2017-03-03 LAB — GLUCOSE, CAPILLARY
GLUCOSE-CAPILLARY: 125 mg/dL — AB (ref 65–99)
Glucose-Capillary: 94 mg/dL (ref 65–99)

## 2017-03-03 LAB — PROTIME-INR
INR: 1.26
Prothrombin Time: 15.8 seconds — ABNORMAL HIGH (ref 11.4–15.2)

## 2017-03-03 LAB — POCT ACTIVATED CLOTTING TIME: ACTIVATED CLOTTING TIME: 164 s

## 2017-03-03 SURGERY — PACEMAKER IMPLANT

## 2017-03-03 SURGERY — RIGHT HEART CATH

## 2017-03-03 MED ORDER — HEPARIN (PORCINE) IN NACL 2-0.9 UNIT/ML-% IJ SOLN
INTRAMUSCULAR | Status: DC | PRN
Start: 2017-03-03 — End: 2017-03-03
  Administered 2017-03-03: 500 mL

## 2017-03-03 MED ORDER — SODIUM CHLORIDE 0.9 % IV SOLN
250.0000 mL | INTRAVENOUS | Status: DC | PRN
Start: 2017-03-03 — End: 2017-03-04

## 2017-03-03 MED ORDER — CANAGLIFLOZIN-METFORMIN HCL 50-1000 MG PO TABS: 1.0000 | ORAL_TABLET | Freq: Two times a day (BID) | ORAL | Status: DC

## 2017-03-03 MED ORDER — NITROGLYCERIN 0.4 MG SL SUBL: 0.4000 mg | SUBLINGUAL_TABLET | SUBLINGUAL | Status: DC | PRN

## 2017-03-03 MED ORDER — IOPAMIDOL (ISOVUE-370) INJECTION 76%
INTRAVENOUS | Status: AC
  Filled 2017-03-03: qty 50

## 2017-03-03 MED ORDER — ACETAMINOPHEN 325 MG PO TABS
650.0000 mg | ORAL_TABLET | ORAL | Status: DC | PRN
  Administered 2017-03-03: 650 mg via ORAL
  Filled 2017-03-03: qty 2

## 2017-03-03 MED ORDER — CEFAZOLIN SODIUM-DEXTROSE 2-4 GM/100ML-% IV SOLN
2.0000 g | INTRAVENOUS | Status: AC
  Administered 2017-03-03: 2 g via INTRAVENOUS

## 2017-03-03 MED ORDER — GABAPENTIN 300 MG PO CAPS
600.0000 mg | ORAL_CAPSULE | Freq: Every day | ORAL | Status: DC
  Administered 2017-03-03 – 2017-03-04 (×2): 600 mg via ORAL
  Filled 2017-03-03 (×2): qty 2

## 2017-03-03 MED ORDER — BUPROPION HCL ER (XL) 150 MG PO TB24
150.0000 mg | ORAL_TABLET | Freq: Every day | ORAL | Status: DC
  Administered 2017-03-04 – 2017-03-05 (×2): 150 mg via ORAL
  Filled 2017-03-03 (×2): qty 1

## 2017-03-03 MED ORDER — HEPARIN SODIUM (PORCINE) 1000 UNIT/ML IJ SOLN
INTRAMUSCULAR | Status: AC
  Filled 2017-03-03: qty 1

## 2017-03-03 MED ORDER — CEFAZOLIN SODIUM-DEXTROSE 2-4 GM/100ML-% IV SOLN
INTRAVENOUS | Status: AC
  Filled 2017-03-03: qty 100

## 2017-03-03 MED ORDER — ASPIRIN EC 81 MG PO TBEC
81.0000 mg | DELAYED_RELEASE_TABLET | Freq: Every day | ORAL | Status: DC
  Administered 2017-03-04 – 2017-03-05 (×2): 81 mg via ORAL
  Filled 2017-03-03 (×2): qty 1

## 2017-03-03 MED ORDER — ONDANSETRON HCL 4 MG/2ML IJ SOLN: 4.0000 mg | Freq: Four times a day (QID) | INTRAMUSCULAR | Status: DC | PRN

## 2017-03-03 MED ORDER — HEPARIN SODIUM (PORCINE) 1000 UNIT/ML IJ SOLN
INTRAMUSCULAR | Status: DC | PRN
  Administered 2017-03-03: 6000 [IU] via INTRAVENOUS

## 2017-03-03 MED ORDER — YOU HAVE A PACEMAKER BOOK
Freq: Once | Status: DC
  Filled 2017-03-03: qty 1

## 2017-03-03 MED ORDER — SODIUM CHLORIDE 0.9 % IV SOLN: INTRAVENOUS | Status: DC

## 2017-03-03 MED ORDER — HEPARIN (PORCINE) IN NACL 2-0.9 UNIT/ML-% IJ SOLN
INTRAMUSCULAR | Status: AC
  Filled 2017-03-03: qty 500

## 2017-03-03 MED ORDER — PANTOPRAZOLE SODIUM 40 MG PO TBEC
40.0000 mg | DELAYED_RELEASE_TABLET | Freq: Every day | ORAL | Status: DC
  Administered 2017-03-04: 40 mg via ORAL
  Filled 2017-03-03 (×2): qty 1

## 2017-03-03 MED ORDER — LIDOCAINE HCL (PF) 1 % IJ SOLN
INTRAMUSCULAR | Status: AC
  Filled 2017-03-03: qty 60

## 2017-03-03 MED ORDER — SODIUM CHLORIDE 0.9 % IR SOLN
Status: DC | PRN
  Administered 2017-03-03: 19:00:00

## 2017-03-03 MED ORDER — MIDAZOLAM HCL 2 MG/2ML IJ SOLN
INTRAMUSCULAR | Status: DC | PRN
  Administered 2017-03-03: 1 mg via INTRAVENOUS

## 2017-03-03 MED ORDER — LIDOCAINE HCL 1 % IJ SOLN
INTRAMUSCULAR | Status: AC
  Filled 2017-03-03: qty 20

## 2017-03-03 MED ORDER — METFORMIN HCL 500 MG PO TABS: 1000.0000 mg | ORAL_TABLET | Freq: Two times a day (BID) | ORAL | Status: DC

## 2017-03-03 MED ORDER — TESTOSTERONE 50 MG/5GM (1%) TD GEL: 10.0000 g | Freq: Every day | TRANSDERMAL | Status: DC

## 2017-03-03 MED ORDER — SODIUM CHLORIDE 0.9 % IV SOLN: 250.0000 mL | INTRAVENOUS | Status: DC | PRN

## 2017-03-03 MED ORDER — SODIUM CHLORIDE 0.9% FLUSH: 3.0000 mL | INTRAVENOUS | Status: DC | PRN

## 2017-03-03 MED ORDER — ACETAMINOPHEN 325 MG PO TABS: 325.0000 mg | ORAL_TABLET | ORAL | Status: DC | PRN

## 2017-03-03 MED ORDER — VITAMIN D 1000 UNITS PO TABS
5000.0000 [IU] | ORAL_TABLET | Freq: Every day | ORAL | Status: DC
Start: 2017-03-04 — End: 2017-03-05
  Administered 2017-03-04 – 2017-03-05 (×2): 5000 [IU] via ORAL
  Filled 2017-03-03 (×2): qty 5

## 2017-03-03 MED ORDER — POTASSIUM CHLORIDE CRYS ER 10 MEQ PO TBCR
10.0000 meq | EXTENDED_RELEASE_TABLET | Freq: Two times a day (BID) | ORAL | Status: DC
  Administered 2017-03-03: 10 meq via ORAL
  Filled 2017-03-03 (×3): qty 1

## 2017-03-03 MED ORDER — LOSARTAN POTASSIUM 50 MG PO TABS
25.0000 mg | ORAL_TABLET | Freq: Every day | ORAL | Status: DC
  Administered 2017-03-03 – 2017-03-04 (×2): 25 mg via ORAL
  Filled 2017-03-03 (×2): qty 1

## 2017-03-03 MED ORDER — CEFAZOLIN SODIUM-DEXTROSE 1-4 GM/50ML-% IV SOLN
1.0000 g | Freq: Four times a day (QID) | INTRAVENOUS | Status: AC
  Administered 2017-03-04 (×2): 1 g via INTRAVENOUS
  Filled 2017-03-03 (×3): qty 50

## 2017-03-03 MED ORDER — FENTANYL CITRATE (PF) 100 MCG/2ML IJ SOLN
INTRAMUSCULAR | Status: DC | PRN
  Administered 2017-03-03: 25 ug via INTRAVENOUS

## 2017-03-03 MED ORDER — ADULT MULTIVITAMIN W/MINERALS CH
1.0000 | ORAL_TABLET | Freq: Every day | ORAL | Status: DC
Start: 2017-03-04 — End: 2017-03-05
  Administered 2017-03-04 – 2017-03-05 (×2): 1 via ORAL
  Filled 2017-03-03 (×2): qty 1

## 2017-03-03 MED ORDER — LIDOCAINE HCL (PF) 1 % IJ SOLN
INTRAMUSCULAR | Status: DC | PRN
  Administered 2017-03-03: 40 mL via INTRADERMAL

## 2017-03-03 MED ORDER — SODIUM CHLORIDE 0.9 % IR SOLN: 80.0000 mg | Status: DC

## 2017-03-03 MED ORDER — WARFARIN - PHARMACIST DOSING INPATIENT
Freq: Every day | Status: DC
  Administered 2017-03-04: 18:00:00

## 2017-03-03 MED ORDER — ROSUVASTATIN CALCIUM 20 MG PO TABS
40.0000 mg | ORAL_TABLET | Freq: Every day | ORAL | Status: DC
  Administered 2017-03-03 – 2017-03-04 (×2): 40 mg via ORAL
  Filled 2017-03-03 (×2): qty 2

## 2017-03-03 MED ORDER — SODIUM CHLORIDE 0.9% FLUSH: 3.0000 mL | Freq: Two times a day (BID) | INTRAVENOUS | Status: DC

## 2017-03-03 MED ORDER — FENTANYL CITRATE (PF) 100 MCG/2ML IJ SOLN
INTRAMUSCULAR | Status: AC
  Filled 2017-03-03: qty 2

## 2017-03-03 MED ORDER — SODIUM CHLORIDE 0.9 % IV SOLN
250.0000 mL | INTRAVENOUS | Status: DC | PRN
  Administered 2017-03-04: 21:00:00 250 mL via INTRAVENOUS

## 2017-03-03 MED ORDER — MIDAZOLAM HCL 5 MG/5ML IJ SOLN
INTRAMUSCULAR | Status: DC | PRN
  Administered 2017-03-03 (×3): 1 mg via INTRAVENOUS

## 2017-03-03 MED ORDER — MIDAZOLAM HCL 2 MG/2ML IJ SOLN
INTRAMUSCULAR | Status: AC
  Filled 2017-03-03: qty 2

## 2017-03-03 MED ORDER — ESCITALOPRAM OXALATE 20 MG PO TABS
20.0000 mg | ORAL_TABLET | Freq: Every day | ORAL | Status: DC
  Administered 2017-03-04: 20 mg via ORAL
  Filled 2017-03-03 (×2): qty 1

## 2017-03-03 MED ORDER — SODIUM CHLORIDE 0.9 % IV SOLN
INTRAVENOUS | Status: DC
  Administered 2017-03-03: 11:00:00 via INTRAVENOUS

## 2017-03-03 MED ORDER — FUROSEMIDE 10 MG/ML IJ SOLN
60.0000 mg | Freq: Two times a day (BID) | INTRAMUSCULAR | Status: DC
  Administered 2017-03-04 – 2017-03-05 (×3): 60 mg via INTRAVENOUS
  Filled 2017-03-03 (×3): qty 6

## 2017-03-03 MED ORDER — IOPAMIDOL (ISOVUE-370) INJECTION 76%
INTRAVENOUS | Status: DC | PRN
  Administered 2017-03-03: 10 mL via INTRAVENOUS

## 2017-03-03 MED ORDER — GENTAMICIN SULFATE 40 MG/ML IJ SOLN
INTRAMUSCULAR | Status: AC
  Filled 2017-03-03: qty 2

## 2017-03-03 MED ORDER — WARFARIN SODIUM 2.5 MG PO TABS: 4.5000 mg | ORAL_TABLET | Freq: Once | ORAL | Status: DC

## 2017-03-03 MED ORDER — TESTOSTERONE 12.5 MG/ACT (1%) TD GEL: 8.0000 | Freq: Every day | TRANSDERMAL | Status: DC

## 2017-03-03 MED ORDER — CANAGLIFLOZIN 100 MG PO TABS
100.0000 mg | ORAL_TABLET | Freq: Every day | ORAL | Status: DC
  Administered 2017-03-04 – 2017-03-05 (×2): 100 mg via ORAL
  Filled 2017-03-03 (×2): qty 1

## 2017-03-03 MED ORDER — SODIUM CHLORIDE 0.9% FLUSH
3.0000 mL | Freq: Two times a day (BID) | INTRAVENOUS | Status: DC
  Administered 2017-03-03: 22:00:00 3 mL via INTRAVENOUS

## 2017-03-03 MED ORDER — OFF THE BEAT BOOK
Freq: Once | Status: DC
  Filled 2017-03-03: qty 1

## 2017-03-03 MED ORDER — FENTANYL CITRATE (PF) 100 MCG/2ML IJ SOLN
INTRAMUSCULAR | Status: DC | PRN
  Administered 2017-03-03: 25 ug via INTRAVENOUS
  Administered 2017-03-03 (×2): 12.5 ug via INTRAVENOUS

## 2017-03-03 MED ORDER — MIDAZOLAM HCL 5 MG/5ML IJ SOLN
INTRAMUSCULAR | Status: AC
  Filled 2017-03-03: qty 5

## 2017-03-03 MED ORDER — WARFARIN SODIUM 2.5 MG PO TABS
4.5000 mg | ORAL_TABLET | ORAL | Status: AC
  Administered 2017-03-03: 4.5 mg via ORAL
  Filled 2017-03-03: qty 1

## 2017-03-03 SURGICAL SUPPLY — 13 items
CABLE SURGICAL S-101-97-12 (CABLE) ×4 IMPLANT
CATH RIGHTSITE C315HIS02 (CATHETERS) ×2 IMPLANT
IPG PACE AZUR XT DR MRI W1DR01 (Pacemaker) ×1 IMPLANT
LEAD CAPSURE NOVUS 5076-52CM (Lead) ×2 IMPLANT
LEAD SELECT SECURE 3830 383069 (Lead) ×1 IMPLANT
PACE AZURE XT DR MRI W1DR01 (Pacemaker) ×2 IMPLANT
PAD DEFIB LIFELINK (PAD) ×2 IMPLANT
SELECT SECURE 3830 383069 (Lead) ×2 IMPLANT
SHEATH CLASSIC 7F (SHEATH) ×4 IMPLANT
SHEATH CLASSIC 9F (SHEATH) ×2 IMPLANT
SLITTER 6232ADJ (MISCELLANEOUS) ×2 IMPLANT
TRAY PACEMAKER INSERTION (PACKS) ×2 IMPLANT
WIRE HI TORQ VERSACORE-J 145CM (WIRE) ×2 IMPLANT

## 2017-03-03 SURGICAL SUPPLY — 16 items
CATH ACUNAV 8FR 90CM (CATHETERS) ×2 IMPLANT
CATH BALLN WEDGE 5F 110CM (CATHETERS) ×2 IMPLANT
CATH INFINITI 5FR ANG PIGTAIL (CATHETERS) ×2 IMPLANT
CATH SWAN GANZ 7F STRAIGHT (CATHETERS) ×2 IMPLANT
INTRODUCER SWARTZ SL2 8F (SHEATH) ×2 IMPLANT
KIT HEART LEFT (KITS) ×4 IMPLANT
NEEDLE TRANSEP BRK 71CM 407200 (NEEDLE) ×2 IMPLANT
PACK CARDIAC CATHETERIZATION (CUSTOM PROCEDURE TRAY) ×2 IMPLANT
SHEATH GLIDE SLENDER 4/5FR (SHEATH) ×2 IMPLANT
SHEATH INTROD W/O MIN 9FR 25CM (SHEATH) ×2 IMPLANT
SHEATH PINNACLE 6F 10CM (SHEATH) ×2 IMPLANT
SHEATH PINNACLE 8F 10CM (SHEATH) ×2 IMPLANT
TRANSDUCER W/STOPCOCK (MISCELLANEOUS) ×4 IMPLANT
TUBING CIL FLEX 10 FLL-RA (TUBING) ×2 IMPLANT
WIRE EMERALD 3MM-J .025X260CM (WIRE) ×2 IMPLANT
WIRE EMERALD 3MM-J .035X260CM (WIRE) ×2 IMPLANT

## 2017-03-03 NOTE — Interval H&P Note (Signed)
History and Physical Interval Note:  03/03/2017 11:45 AM  Mark Choi  has presented today for surgery, with the diagnosis of constrictive pericarditis  The various methods of treatment have been discussed with the patient and family. After consideration of risks, benefits and other options for treatment, the patient has consented to  Procedure(s): Right/Left Heart Cath and Coronary Angiography (N/A) as a surgical intervention .  The patient's history has been reviewed, patient examined, no change in status, stable for surgery.  I have reviewed the patient's chart and labs.  Questions were answered to the patient's satisfaction.    Have reviewed case with Dr Aundra Dubin and Dr Acie Fredrickson. Plan for right/left heart cath with trans-septal puncture (ICE guidance) in this patient who has clinical symptoms concerning for constrictive pericarditis who has a mechanical aortic valve. Plan discussed with patient and wife who understand - all questions answered.  Sherren Mocha

## 2017-03-03 NOTE — H&P (Signed)
Advanced Heart Failure Team History and Physical Note   Primary Physician:  Dr. Ernie Hew Primary Cardiologist:  Dr. Acie Fredrickson HF cardiology: Dr. Aundra Dubin   Reason for Admission: 2:1 AV block with bradycardia into 40s   HPI:    Mark Choi is a 51 y.o. male with hx CAD s/p CABG, Hodgkins lymphoma treated with radiation, mechanical aortic valve on coumadin, and progressive dyspnea.   Patient had mantle radiation for Hodgkins lymphoma at age 66.  In his 100s, he had CABG (1998).  In 2010, he had mechanical aortic valve replacement.  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  CPX in 01/2017 more suggestive of ventilatory limitation with severe restrictive lung physiology. RHC repeated in 01/2017 with elevated filling pressures, however unable to cross aortic valve (mechanical prosthesis) for simultaneous LV/RV measurement.  Pt set up for [facility] with tran-septal puncture.  Pt presented today for [facility] with trans-septal puncture due to mechanical aortic valve for confirmation of pericardial constriction. After procedure, noted to be bradycardic into 40s. EKG/Tele with 2:1 AV block. Pt relatively asymptomatic, though states still tired from procedure sedation. Pertinent labs this am include K 3.7, Creatinine 1.19, Hgb 11.9, and INR 1.26 due to bridging with lovenox for procedure.  Pt feeling OK, but remains tired from sedation. Denies chest pain or lightheadedness/dizziness.  No palpitations. Orthopneic at baseline.  Weight stable in low 180s at home (180-182). Took lasix this am.  Not on BB. Denies discomfort or pain.  Slightly frustrated about needing to stay overnight.   Review of systems complete and found to be negative unless listed in HPI.    [facility] 03/03/17  Fick Cardiac Output 4.88 L/min  Fick  Cardiac Output Index 2.42 (L/min)/BSA  RA A Wave 15 mmHg  RA V Wave 15 mmHg  RA Mean 17 mmHg  RV Systolic Pressure 48 mmHg  RV Diastolic Pressure 6 mmHg  RV EDP 15 mmHg  PA Systolic Pressure 40 mmHg  PA Diastolic Pressure 6 mmHg  PA Mean 19 mmHg  PW A Wave 22 mmHg  PW V Wave 37 mmHg  PW Mean 25 mmHg  LV Systolic Pressure 825 mmHg  LV Diastolic Pressure 9 mmHg  LV EDP 22 mmHg  Left Atrium Extended Mean Pressure 21 mmHg  Left Atrium Extended A WAVE Pressure 26 mmHg  Left Atrium Extended V WAVE Pressure 21 mmHg  QP/QS 1  TPVR Index 10.75 HRUI    Home Medications Prior to Admission medications   Medication Sig Start Date End Date Taking? Authorizing Provider  aspirin 81 MG tablet Take 81 mg by mouth daily.   Yes [provider]  buPROPion (WELLBUTRIN XL) 150 MG 24 hr tablet Take 150 mg by mouth daily. 10/03/16  Yes [provider]  Canagliflozin-Metformin HCl (INVOKAMET) 50-1000 MG TABS Take 1 tablet by mouth 2 (two) times daily before a meal.   Yes [provider]  Cholecalciferol (VITAMIN D-3) 5000 units TABS Take 5,000 Units by mouth daily.   Yes [provider]  enoxaparin (LOVENOX) 80 MG/0.8ML injection Inject 0.8 mLs (80 mg total) into the skin every 12 (twelve) hours. 02/27/17  Yes Nahser, Wonda Cheng, MD  escitalopram (LEXAPRO) 10 MG tablet Take 20 mg by mouth daily.    Yes [provider]  furosemide (LASIX) 40 MG tablet Take 1.5 tablets (  60 mg total) by mouth daily. Patient taking differently: Take 60 mg by mouth 2 (two) times daily.  02/18/17  Yes Larey Dresser, MD  gabapentin (NEURONTIN) 300 MG capsule Take 2 capsules (600 mg total) by mouth at bedtime. 02/07/17  Yes Kathrynn Ducking, MD  losartan (COZAAR) 25 MG tablet Take 25 mg by mouth at bedtime.    Yes [provider]  Multiple Vitamins-Minerals (MENS MULTI VITAMIN & MINERAL) TABS Take 1 tablet by mouth daily.   Yes [provider]  omeprazole (PRILOSEC) 20  MG capsule Take 20 mg by mouth daily.   Yes [provider]  potassium chloride (K-DUR) 10 MEQ tablet Take 1 tablet (10 mEq total) by mouth daily. Patient taking differently: Take 10 mEq by mouth 2 (two) times daily.  09/16/16  Yes Nahser, Wonda Cheng, MD  rosuvastatin (CRESTOR) 40 MG tablet Take 40 mg by mouth at bedtime.    Yes [provider]  Testosterone 12.5 MG/ACT (1%) GEL Apply 8 Squirts topically daily.  10/19/14  Yes [provider]  nitroGLYCERIN (NITROSTAT) 0.4 MG SL tablet Place 1 tablet (0.4 mg total) under the tongue every 5 (five) minutes as needed for chest pain. 10/21/15   Nahser, Wonda Cheng, MD  warfarin (COUMADIN) 3 MG tablet Take 1 tablet (3 mg total) by mouth as directed. Take 3 mg by mouth as directed by [facility] Patient taking differently: Take 3 mg by mouth daily at 6 PM.  03/09/16   Nahser, Wonda Cheng, MD    Past Medical History: Past Medical History:  Diagnosis Date  . Allergic rhinitis   . 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 Coronary artery disease   . Depression   . Diabetes mellitus without complication ([place])   . GERD (gastroesophageal reflux disease)   . Hodgkin's disease ([place])    AGE 49 IN REMISSION  . HX: long term anticoagulant use   . Hyperlipidemia   . Impacted cerumen   . Major depression   . MDD (major depressive disorder)    STABLE  . Migraine   . Tachycardia   . Thrombophilia ([place])   . Vitamin D deficiency     Past Surgical History: Past Surgical History:  Procedure Laterality Date  . ANGIOPLASTY     5  . ARTIFICIAL AORTIC VALVE     [facility]  . CARDIAC CATHETERIZATION N/A 04/26/2016   Procedure: Right/Left Heart Cath and Coronary/Graft Angiography;  Surgeon: Sherren Mocha, MD;  Location: [facility];  Service: Cardiovascular;  Laterality: N/A;  . CARDIAC SURGERY    . CORONARY ARTERY BYPASS GRAFT    . HEART STENT  2004, 2007   3 STENTS  . LYMPH NODE BIOPSY  1984  . RIGHT HEART CATH N/A 02/02/2017    Procedure: Right Heart Cath;  Surgeon: Larey Dresser, MD;  Location: [facility];  Service: Cardiovascular;  Laterality: N/A;  . SHUNT REPLACEMENT  1985   Subcutaneous shunt placed and then removed  . THORACOTOMY  2011    Family History: Family History  Problem Relation Age of Onset  . Breast cancer Mother   . Alcohol abuse Mother   . Diabetes type II Father   . Diverticulitis Father   . Alcoholism Brother   . Depression Brother   . Cancer Maternal Grandmother   . Cancer Maternal Grandfather   . Alcohol abuse Paternal Grandfather   . Heart disease Paternal Grandfather     Social History: Social History  Social History  . Marital status: Married    Spouse name: N/A  . Number of children: 1  . Years of education: College   Occupational History  . Works at home    Social History Main Topics  . Smoking status: Never Smoker  . Smokeless tobacco: Never Used  . Alcohol use 1.2 oz/week    2 Cans of beer per week     Comment: 4 drinks per week  . Drug use: No  . Sexual activity: Not Currently    Partners: Female     Comment: Married   Other Topics Concern  . Not on file   Social History Narrative   ** Merged History Encounter **   Lives at home w/ his wife and son   Right-handed   Drinks about 4 cups of coffee per day        Allergies:  No Known Allergies  Objective:    Vital Signs:   Temp:  [97.4 F (36.3 C)] 97.4 F (36.3 C) (05/24 0933) Pulse Rate:  [21-65] 45 (05/24 1415) Resp:  [10-99] 14 (05/24 1415) BP: (106-126)/(55-70) 116/66 (05/24 1415) SpO2:  [0 %-100 %] 94 % (05/24 1415) Weight:  [185 lb (83.9 kg)] 185 lb (83.9 kg) (05/24 0933)   Filed Weights   03/03/17 0933  Weight: 185 lb (83.9 kg)    Physical Exam: General:  Well appearing. NAD HEENT: Normal Neck: Supple. JVP 9-10 cm at least. Carotids 2+ bilat; no bruits. No lymphadenopathy or thyromegaly appreciated. Cor: PMI nondisplaced. Marked brady.  Mechanical S2 Lungs: clear    Abdomen: Soft, nontender, nondistended. No hepatosplenomegaly. No bruits or masses. Good bowel sounds. Extremities: no cyanosis, clubbing, or rash. Trace ankle edema. Neuro: alert & oriented x 3, cranial nerves grossly intact. moves all 4 extremities w/o difficulty. Affect pleasant.  Telemetry: Personally reviewed, 2:1 AV block, rates in 40s  Labs: Basic Metabolic Panel:  Recent Labs Lab 02/28/17 0936 03/03/17 1037  NA 141 140  K 3.7 3.7  CL 104 104  CO2 26 28  GLUCOSE 144* 132*  BUN 22* 19  CREATININE 1.35* 1.19  CALCIUM 9.5 9.1    Liver Function Tests: No results for input(s): AST, ALT, ALKPHOS, BILITOT, PROT, ALBUMIN in the last 168 hours. No results for input(s): LIPASE, AMYLASE in the last 168 hours. No results for input(s): AMMONIA in the last 168 hours.  CBC:  Recent Labs Lab 03/03/17 1037  WBC 12.3*  HGB 11.9*  HCT 36.6*  MCV 84.1  PLT 120*    Cardiac Enzymes: No results for input(s): CKTOTAL, CKMB, CKMBINDEX, TROPONINI in the last 168 hours.  BNP: BNP (last 3 results)  Recent Labs  01/28/17 1242 02/28/17 0936  BNP 155.6* 175.7*    ProBNP (last 3 results)  Recent Labs  04/07/16 1407  PROBNP 54.0     CBG: No results for input(s): GLUCAP in the last 168 hours.  Coagulation Studies:  Recent Labs  03/03/17 1037  LABPROT 15.8*  INR 1.26   Other results: EKG: 2:1 AV block with 44 bpm  Imaging:  No results found.  Assessment/Plan   Mark Choi is a 51 y.o. male with hx CAD s/p CABG, Hodgkins lymphoma treated with radiation, mechanical aortic valve on coumadin, and progressive dyspnea admitted from cath lab after developed marked brady s/p R/LHC with trans-septal puncture.  Admitted to observation for evaluation of rate/rhythm and diuresis.  1. 2:1 AV block with bradycardia - Will admit to obs. If does not  improve will need to consider PPM.  Pt would be high risk with marked co-morbidities including constrictive  pericarditis and mechanical AVR.    - No BBs on board. - Of note, had severe chronotropic incompetence noted on CPX. CPX previously cut back and then held. 2. CAD s/p CABG - Cath 04/2016 with stable coronaries and grafts. No CP.  3. Acute on chronic diastolic CHF - Volume overloaded with elevated filling pressures - Diurese with Choi lasix 60 mg BID starting this evening 4. Exertional dyspnea - NYHA class III-IIb.  Concerns for constrictive pericarditis.   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  5. HTN - Stable.  - Continue losartan for now. May need to back off if pressures drop with bradycardia.  6. Mechanical AVR - Continue lovenox bridge until INR therapeutic - Resume coumadin tonight.   - Would not be able to place pacemaker on Lovenox, so if needs, will have to be kept over weekend.    Length of Stay: 0  Annamaria Helling 03/03/2017, 2:22 PM  Advanced Heart Failure Team Pager [phone] (M-F; 7a - 4p)  Please contact [facility] for night-coverage after hours (4p -7a ) and weekends on [email or web address]  Patient seen with PA, agree with the above note.  1. 2:1 AV block with LBBB: HR low 40s.  Baseline ECG with 1st degree AVB, LBBB.  2:1 block is new from last office visit on 5/11.  He has history of chronotropic incompetence on CPX.  He was on low dose metoprolol in the past but now off.  Suspect this is progression of conduction system disease, possibly related to his prior radiation.  I discussed with Dr. Lovena Le, given baseline abnormality this is unlikely to improve (or if it does improve, will not improve for long).  Will plan PPM placement while INR is low.  He can resume warfarin afterwards but will not bridge with Lovenox.  2. Mechanical aortic valve: Normal function on 4/18 echo.  Restart warfarin after PPM placement.  3. Restrictive cardiomyopathy/constrictive pericarditis: Echo and cardiac MRI did show  some concerning signs for constrictive pericarditis with inspiratory septal shift though this was not marked.  There was also some pericardial thickening on echo and chest CT. Today, he had simultaneous left and right heart cath with trans-septal puncture given mechanical aortic valve to more clearly delineate restrictive CMP versus constrictive pericarditis.  This showed elevated pressures throughout with diastolic equalization, there was a dip and plateau pattern in the LV and RV tracings, there was a rapid y descent in the RA pressure tracing.  However, there was no clear ventricular interdependence => LV and RV pressures seemed to track together with inspiration rather than in opposite directions.  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  Suspect this is due to prior chest radiation.  He is markedly symptomatic, NYHA class Choi.  I am concerned that pericardial stripping alone is not going to alleviate his symptoms.  I think that his best option will be evaluation in the transplant clinic at [facility] as the only effective treatment for severely symptomatic restrictive cardiomyopathy is likely going to be transplant.  - I talked with Dr Melvyn Novas at [facility] today, will refer Mr Sobel to transplant evaluation with him.  - Given elevated filling pressures, will diurese with Choi Lasix while in-house.  Will give Lasix 60 mg Choi bid.  4. 1. CAD:  s/p CABG.  Cath in 7/17 with stable coronaries and grafts.  Patient had already started to develop dyspnea at that time, so doubt progressive CAD as cause of symptoms.  - Continue ASA 81, statin.   Loralie Champagne 03/03/2017 4:32 PM

## 2017-03-03 NOTE — Consult Note (Signed)
ELECTROPHYSIOLOGY CONSULT NOTE    Patient ID: Mark Choi MRN: [id number], DOB/AGE: 52-Dec-1967 52 y.o.  Admit date: 03/03/2017 Date of Consult: @TODAY @  Primary Physician: Fanny Bien, MD Primary Cardiologist: [place] Heart Failure: Aundra Dubin  Reason for Consultation: 2:1 heart block  HPI:  Mark Choi is a 51 y.o. male is referred by Dr Aundra Dubin for evaluation of 2:1 heart block.  Past medical history is significant for CAD s/p CABG, Hodgkins lymphoma s/p mantle radiation, mechanical AVR.  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  On arrival, he was found to be in 2:1 heart block with LBBB.  He has been off BB for several weeks.  He is on no other AVN blocking agents. EP has been asked to evaluate for treatment options.  He currently denies chest pain at rest, shortness of breath above baseline, LE edema, recent fevers, chills, nausea or vomiting. He has been bridged with Lovenox until today for mechanical AVR. He has not had dizziness, pre-syncope, or syncope.   At baseline, he has a 1st degree AV block and LBBB.  Past Medical History:  Diagnosis Date  . Allergic rhinitis   . 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 Coronary artery disease   . Depression   . Diabetes mellitus without complication ([place])   . GERD (gastroesophageal reflux disease)   . Hodgkin's disease ([place])    AGE 1 IN REMISSION  . HX: long term anticoagulant use   . Hyperlipidemia   . Impacted cerumen   . Major depression   . MDD (major depressive disorder)    STABLE  . Migraine   . Tachycardia   . Thrombophilia ([place])   . Vitamin D deficiency      Surgical History:  Past Surgical History:  Procedure Laterality Date  . ANGIOPLASTY     5  . ARTIFICIAL AORTIC VALVE     [facility]  . CARDIAC CATHETERIZATION N/A 04/26/2016   Procedure: Right/Left Heart Cath and Coronary/Graft Angiography;  Surgeon: Sherren Mocha, MD;  Location: [facility];  Service: Cardiovascular;  Laterality: N/A;  . CARDIAC SURGERY    . CORONARY ARTERY BYPASS GRAFT    . HEART STENT  2004, 2007   3 STENTS  . LYMPH NODE BIOPSY  1984  . RIGHT HEART CATH N/A 02/02/2017   Procedure: Right Heart Cath;  Surgeon: Larey Dresser, MD;  Location: [facility];  Service: Cardiovascular;  Laterality: N/A;  . SHUNT REPLACEMENT  1985   Subcutaneous shunt placed and then removed  . THORACOTOMY  2011     Prescriptions Prior to Admission  Medication Sig Dispense Refill Last Dose  . aspirin 81 MG tablet Take 81 mg by mouth daily.   03/03/2017 at 0630  . buPROPion (WELLBUTRIN XL) 150 MG 24 hr tablet Take 150 mg by mouth daily.  1 03/03/2017 at 0630  . Canagliflozin-Metformin HCl (INVOKAMET) 50-1000 MG TABS Take 1 tablet by mouth 2 (two) times daily before a meal.   03/02/2017 at Unknown time  . Cholecalciferol (VITAMIN D-3) 5000 units TABS Take 5,000 Units by mouth daily.   03/03/2017 at 0630  . enoxaparin (LOVENOX) 80 MG/0.8ML injection Inject 0.8 mLs (80 mg total) into the skin every 12 (twelve) hours. 20 Syringe 0 03/02/2017 at 0900  . escitalopram (LEXAPRO) 10 MG tablet Take 20 mg by mouth daily.    03/03/2017 at 0630  . furosemide (  LASIX) 40 MG tablet Take 1.5 tablets (60 mg total) by mouth daily. (Patient taking differently: Take 60 mg by mouth 2 (two) times daily. ) 90 tablet 3 03/03/2017 at 0630  . gabapentin (NEURONTIN) 300 MG capsule Take 2 capsules (600 mg total) by mouth at bedtime. 180 capsule 3 03/02/2017 at Unknown time  . losartan (COZAAR) 25 MG tablet Take 25 mg by mouth at bedtime.    03/02/2017 at Unknown time  . Multiple Vitamins-Minerals (MENS MULTI VITAMIN & MINERAL) TABS Take 1 tablet by mouth daily.   03/03/2017 at 0630  . omeprazole (PRILOSEC) 20 MG capsule Take 20 mg by mouth daily.   03/03/2017 at 0630  . potassium chloride (K-DUR) 10 MEQ tablet Take 1 tablet (10 mEq total) by mouth daily. (Patient taking  differently: Take 10 mEq by mouth 2 (two) times daily. ) 90 tablet 3 03/03/2017 at 0630  . rosuvastatin (CRESTOR) 40 MG tablet Take 40 mg by mouth at bedtime.    03/02/2017 at Unknown time  . Testosterone 12.5 MG/ACT (1%) GEL Apply 8 Squirts topically daily.   1 Past Week at Unknown time  . nitroGLYCERIN (NITROSTAT) 0.4 MG SL tablet Place 1 tablet (0.4 mg total) under the tongue every 5 (five) minutes as needed for chest pain. 25 tablet 6 Unknown at Unknown time  . warfarin (COUMADIN) 3 MG tablet Take 1 tablet (3 mg total) by mouth as directed. Take 3 mg by mouth as directed by [facility] (Patient taking differently: Take 3 mg by mouth daily at 6 PM. ) 120 tablet 1 02/25/2017 at 2030    Inpatient Medications: . sodium chloride flush  3 mL Intravenous Q12H    Allergies: No Known Allergies  Social History   Social History  . Marital status: Married    Spouse name: N/A  . Number of children: 1  . Years of education: College   Occupational History  . Works at home    Social History Main Topics  . Smoking status: Never Smoker  . Smokeless tobacco: Never Used  . Alcohol use 1.2 oz/week    2 Cans of beer per week     Comment: 4 drinks per week  . Drug use: No  . Sexual activity: Not Currently    Partners: Female     Comment: Married   Other Topics Concern  . Not on file   Social History Narrative   ** Merged History Encounter **   Lives at home w/ his wife and son   Right-handed   Drinks about 4 cups of coffee per day         Family History  Problem Relation Age of Onset  . Breast cancer Mother   . Alcohol abuse Mother   . Diabetes type II Father   . Diverticulitis Father   . Alcoholism Brother   . Depression Brother   . Cancer Maternal Grandmother   . Cancer Maternal Grandfather   . Alcohol abuse Paternal Grandfather   . Heart disease Paternal Grandfather      Review of Systems: All other systems reviewed and are otherwise negative except as noted  above.  Physical Exam: Vitals:   03/03/17 1420 03/03/17 1425 03/03/17 1430 03/03/17 1435  BP: (!) 114/57 (!) 117/55 112/61 (!) 121/57  Pulse: (!) 42 (!) 43 (!) 43 (!) 106  Resp: 13 12 13 13   Temp:      TempSrc:      SpO2: 95% 94% 93% 94%  Weight:  Height:        GEN- The patient is well appearing, alert and oriented x 3 today.   HEENT: normocephalic, atraumatic; sclera clear, conjunctiva pink; hearing intact; oropharynx clear; neck supple  Lungs- Clear to ausculation bilaterally, normal work of breathing.  No wheezes, rales, rhonchi Heart- Regular rate and rhythm, +mechanical heart sounds  GI- soft, non-tender, non-distended, bowel sounds present  Extremities- no clubbing, cyanosis, or edema  MS- no significant deformity or atrophy Skin- warm and dry, no rash or lesion Psych- euthymic mood, full affect Neuro- strength and sensation are intact  Labs:  Lab Results  Component Value Date   WBC 12.3 (H) 03/03/2017   HGB 11.9 (L) 03/03/2017   HCT 36.6 (L) 03/03/2017   MCV 84.1 03/03/2017   PLT 120 (L) 03/03/2017    Recent Labs Lab 03/03/17 1037  NA 140  K 3.7  CL 104  CO2 28  BUN 19  CREATININE 1.19  CALCIUM 9.1  GLUCOSE 132*      Radiology/Studies: Ct Chest High Resolution  Result Date: 02/04/2017 CLINICAL DATA:  51 year old male with history of dyspnea for 1 year. Evaluate for pulmonary fibrosis. EXAM: CT CHEST WITHOUT CONTRAST TECHNIQUE: Multidetector CT imaging of the chest was performed following the standard protocol without intravenous contrast. High resolution imaging of the lungs, as well as inspiratory and expiratory imaging, was performed. COMPARISON:  Chest CT 03/10/2016. FINDINGS: Cardiovascular: Heart size is normal. There is no significant pericardial fluid, thickening or pericardial calcification. There is aortic atherosclerosis, as well as atherosclerosis of the great vessels of the mediastinum and the coronary arteries, including calcified  atherosclerotic plaque in the left main, left anterior descending, left circumflex and right coronary arteries. Status post median sternotomy for CABG including LIMA to the LAD. 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 Status post aortic valve replacement with a mechanical aortic valve. Mitral annular calcifications. Mediastinum/Nodes: No pathologically enlarged mediastinal or hilar lymph nodes. Please note that accurate exclusion of hilar adenopathy is limited on noncontrast CT scans. Esophagus is unremarkable in appearance. No axillary lymphadenopathy. Lungs/Pleura: Extensive pleural thickening calcification in the right hemithorax, similar to prior study from 2017, with evidence of chronic volume loss throughout the periphery of the right lung, indicative of a fibrothorax. Trace left pleural effusion. No left pleural calcifications are noted. High-resolution images demonstrate relatively diffuse ground-glass attenuation throughout the lungs bilaterally which appears relatively similar to the prior examination. In the medial aspects of both lungs there are some focal areas of septal thickening and architectural distortion, presumably related to postradiation fibrosis in this patient with history of Hodgkin's disease. No significant traction bronchiectasis or frank honeycombing. Inspiratory and expiratory imaging demonstrates mild to moderate air trapping, indicative of small airways disease. 8 x 7 mm pulmonary nodule in the posterior aspect of the left upper lobe (image 71 of series 5) is unchanged, presumably benign. No other suspicious appearing pulmonary nodules or masses are noted. Upper Abdomen: Multiple tiny partially calcified gallstones are noted within the dependent portion of the gallbladder. No findings to  suggest an acute cholecystitis at this time. Musculoskeletal: There are no aggressive appearing lytic or blastic lesions noted in the visualized portions of the skeleton. IMPRESSION: 1. Unusual appearance of the lungs with relatively diffuse ground-glass attenuation. This pattern can be seen in the setting of a smoking  related disease such as desquamative interstitial pneumonia (DIP), however, in this non smoking patient with history of prior radiation therapy for Hodgkin's disease, these findings are favored to reflect chronic pulmonary edema likely related to chronic lymphatic congestion from prior radiation therapy. Overall, the appearance is very similar to remote prior study 03/10/2016. 2. Right-sided fibrothorax again noted. 3. 8 x 7 mm left upper lobe pulmonary nodule is unchanged compared to 2017, presumably benign. 4. Aortic atherosclerosis, in addition to left main and 3 vessel coronary artery disease. Status post median sternotomy for CABG including LIMA to the LAD, as well as mechanical aortic valve replacement. 5. Cholelithiasis without evidence of acute cholecystitis at this time. Electronically Signed   By: Vinnie Langton M.D.   On: 02/04/2017 16:05    EKG:2:1 heart block with LBBB (personally reviewed)  TELEMETRY: 2:1 heart block (personally reviewed)  Assessment/Plan: 1.  2:1 heart block Asymptomatic but worrisome for concern for underlying conduction system disease. He has no reversible causes.  As he is currently subtherapeutic on Coumadin, bleeding risks for PPM implant are lower.  With mechanical AVR, he would not need lovenox bridging back to therapeutic INR per Dr Aundra Dubin Dr Lovena Le to see patient later this afternoon Will potentially proceed with PPM implant later today after discussion with wife and then resume Warfarin without Lovenox bridge.     Signed, Chanetta Marshall, NP 03/03/2017 2:43 PM  EP Attendng  Patient seen and examined. Agree with a bove. The patient has class  3B CHF due to diastolic dysfunction and a restrictive CM. He has 2:1 AV block and LBBB and I have discussed the treatment options with the patient and he wishes to proceed. He has no reversible causes.  Mikle Bosworth.D.

## 2017-03-03 NOTE — H&P (View-Only) (Signed)
PCP: Dr. Ernie Hew Cardiology: Dr. Acie Fredrickson HF Cardiology: Dr. Aundra Dubin  51 yo with history of CAD s/p CABG, Hodgkins lymphoma treated with radiation, and mechanical aortic valve was referred by Dr. Acie Fredrickson for evaluation of progressive dyspnea.   Patient had mantle radiation for Hodgkins lymphoma at age 75.  In his 92s, he had CABG (1998).  In 2010, he had mechanical aortic valve replacement.  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    Around 5/17, he began to develop progressive dyspnea.  The dyspnea has slowly worsened since that time.  He also developed chest heaviness.  In 5/17, he had a CT chest showing bronchiectasis.  He had right and left heart cath in 7/17 that showed normal filling pressures and stable CAD (no interventional target).  PFTs in 8/17 showed a moderate restrictive defect. Dyspnea continued to progress, now he is short of breath after walking 30-40 feet.  He has constant chest heaviness. He has orthopnea and has to prop up to sleep.    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  Cath in 7/17 was not suggestive of this with completely normal filling pressures.  CPX in 4/18 actually suggested that the patient has more ventilatory than cardiac limitation, severe restrictive lung physiology.  Echo from 4/18 was reviewed, there was some pericardial thickening towards the apex and there appeared to be respirophasic variation of the interventricular septum beyond the septal bounce that would be expected with LBBB. Mitral E velocity was too low to accurately comment. RHC was repeated in 4/18. Filling pressures were elevated.  Also, there were some features concerning for constrictive pericarditis.  However, unable to cross aortic valve (mechanical) for simultaneous LV/RV measurement.  Cardiac MRI from 4/18 showed possible ventricular interdependence on the free-breathing sequences.  Finally, high resolution CT showed diffuse  ground glass in the lungs, possibly from chronic lymphatic congestion post-radiation.  There was no pulmonary fibrosis noted. There did appear to be some pericardial thickening.   After cath, I increased Mr Lochridge's Lasix.  His weight is down 11 lbs but breathing really has not changed much compared to the description above. He is still short of breath walking 40 feet and still orthopneic.  He has to stop about 2/3 of the way up a flight of steps.  Of note, his wife needs to move to [place] for her work.   ECG (3/18): NSR, LBBB-like IVCD QRS 146 msec (new since 7/17).   Labs (3/18): hgb 14, TSH normal Labs (4/18): creatinine 0.99 => 1.06, BNP 156  PMH: 1. Depression 2. GERD 3. CAD: CABG in 1998 => FH CAD, also possible accelerated atherosclerosis from prior radiation to chest.  - PCI after CABG in 2004 and 2007.  - LHC (7/17): 90% ostial LM, 95% ostial LCx, 90% pLAD, sequential LIMA-D1+mid LAD patent, SVG-OM1 patent with retrograde filling distal LCx and OM2.  RCA ok.  4. Hodgkin's lymphoma: s/p mantle radiation at age 39.  59. Type II diabetes 6. Mechanical aortic valve replacement in 2010, possible endocarditis in 2014 treated medically (abx).  7. Hyperlipidemia 8. Chronic LBBB 9. Chronic exertional dyspnea: Gradually progressive.  - CT chest (5/17): Bronchiectasis - RHC (7/17): mean RA 6, PA 30/11, mean PCWP 9, CI 2.58. - PFTs (8/17): moderate restriction on PFTs.  - Echo (4/18): EF 55-60%, mild LVH, grade II diastolic dysfunction, septal paradox (LBBB), mechanical aortic valve, moderately decreased RV systolic function, PASP 43 mmHg, dilated IVC.  - CPX (  4/18): peak VO2 14.9 (43% predicted), VE/VCO2 55, RER 0.97 => submaximal.  However, appears to have severe restrictive lung physiology, he seems to primarily have a ventilatory limitation.  He also was noted to have chronotropic incompetence.   - Cardiac MRI (4/18): EF 64%, no LGE, normal RV size and systolic function, mechanical aortic  with mild AI.  I had free breathing images added on, these were concerning for ventricular interdependence with leftwards septal shift with inspiration.  - RHC (4/18, unable to cross aortic valve due to mechanical AVR): mean RA 15 with prominent y descent noted at times but variable, RV 53/19 with dip and plateau pattern present at times but variable, PA 50/25 mean 33, mean PCWP 27 with prominent v-waves, CI 1.8.  - Echo (4/18): EF 60-65%, septal bounce from LBBB but also some respirophasic variation in septal position was noted.  RV normal.  Mitral valve E wave poorly visualized so hard to comment on respirometry across the mitral valve. Pericardial thickening near apex noted. - High resolution CT chest (4/18): diffuse ground glass infiltrates in lungs.  ?chronic lymphatic congestion from radiation.  This does not look like pulmonary fibrosis.  He has a right-sided fibrothorax.  Finally, thickened pericardium (without calcification) was noted.    FH: Male side with MIs, usually in their early 67s.   Social History   Social History  . Marital status: Married    Spouse name: N/A  . Number of children: 1  . Years of education: College   Occupational History  . Works at home    Social History Main Topics  . Smoking status: Never Smoker  . Smokeless tobacco: Never Used  . Alcohol use 1.2 oz/week    2 Cans of beer per week     Comment: 4 drinks per week  . Drug use: No  . Sexual activity: Not Currently    Partners: Female     Comment: Married   Other Topics Concern  . Not on file   Social History Narrative   ** Merged History Encounter **   Lives at home w/ his wife and son   Right-handed   Drinks about 4 cups of coffee per day       ROS: All systems reviewed and negative except as per HPI.  Current Outpatient Prescriptions  Medication Sig Dispense Refill  . aspirin 81 MG tablet Take 81 mg by mouth daily.    Marland Kitchen buPROPion (WELLBUTRIN XL) 150 MG 24 hr tablet Take 150 mg by mouth  daily.  1  . Canagliflozin-Metformin HCl (INVOKAMET) 50-1000 MG TABS Take 1 tablet by mouth 2 (two) times daily.     Marland Kitchen escitalopram (LEXAPRO) 10 MG tablet Take 10 mg by mouth daily. Pt is now taking 20 mg daily     . famotidine (PEPCID) 20 MG tablet Take 20 mg by mouth daily.    . furosemide (LASIX) 40 MG tablet Take 1.5 tablets (60 mg total) by mouth daily. 90 tablet 3  . gabapentin (NEURONTIN) 300 MG capsule Take 2 capsules (600 mg total) by mouth at bedtime. 180 capsule 3  . losartan (COZAAR) 25 MG tablet Take 25 mg by mouth at bedtime.     . Multiple Vitamins-Minerals (MENS MULTI VITAMIN & MINERAL) TABS Take 1 tablet by mouth daily.    . nitroGLYCERIN (NITROSTAT) 0.4 MG SL tablet Place 1 tablet (0.4 mg total) under the tongue every 5 (five) minutes as needed for chest pain. 25 tablet 6  . potassium chloride (  K-DUR) 10 MEQ tablet Take 1 tablet (10 mEq total) by mouth daily. (Patient taking differently: Take 10 mEq by mouth 2 (two) times daily. ) 90 tablet 3  . rosuvastatin (CRESTOR) 40 MG tablet Take 40 mg by mouth at bedtime.     . Testosterone 12.5 MG/ACT (1%) GEL Apply 12.5 mg topically daily as needed (for hormone therapy).   1  . warfarin (COUMADIN) 3 MG tablet Take 1 tablet (3 mg total) by mouth as directed. Take 3 mg by mouth as directed by [facility] (Patient taking differently: Take 3-4.5 mg by mouth as directed. Takes 3 mg all days except Monday takes 4.5 mg) 120 tablet 1   No current facility-administered medications for this encounter.    BP (!) 112/56   Pulse 69   Ht 5\' 10"  (1.778 m)   Wt 190 lb 4 oz (86.3 kg)   SpO2 96%   BMI 27.30 kg/m  General: NAD Neck: JVP 8-9 cm with HJR, no thyromegaly or thyroid nodule.  Lungs: Clear to auscultation bilaterally with normal respiratory effort. CV: Nondisplaced PMI.  Heart regular mechanical S2, no S3/S4, 2/6 early SEM RUSB. No edema.  No carotid bruit.  Normal pedal pulses.  Abdomen: Soft, nontender, no hepatosplenomegaly, mild  distention.  Skin: Intact without lesions or rashes.  Neurologic: Alert and oriented x 3.  Psych: Normal affect. Extremities: No clubbing or cyanosis.  HEENT: Normal.   Assessment/Plan: 1. CAD: s/p CABG.  Cath in 7/17 with stable coronaries and grafts.  Patient had already started to develop dyspnea at this time, so doubt progressive CAD as cause of symptoms.  - Continue ASA 81, statin.  2. Mechanical aortic valve: Normally-functioning on last echo in 4/18.   - Continue ASA 81 + warfarin.  3. Chronic diastolic CHF: Echo in 0/86 with normal EF.  Elevated filling pressures on 4/18 cardiac cath.  He remains volume overloaded on exam today but weight is down 11 lbs with increased Lasix.  - Increase Lasix to 60 mg bid.  BMET/BNP today and repeat in 10 days.  4. Exertional dyspnea:  This has been progressive since 5/17.  Etiology is uncertain. Symptoms now NYHA class IIIb.  As above, do not think this is related to progressive coronary disease (angiography after symptoms started showed stable disease with patent grafts).  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  CPX was most suggestive of a lung problem => restrictive physiology suggests possible pulmonary fibrosis.  However, high resolution CT chest in 4/18 showed ground glass infiltrates that may be due to chronic lymphatic congestion but not pulmonary fibrosis.  Echo and cardiac MRI free breathing sequences were both concerning for ventricular interdependence with significant septal shift noted with inspiration.  There does appear to be some pericardial thickening on echo (towards apex) and on high resolution chest CT.  RHC was done, showing elevated filling pressures and some features that could be consistent with pericardial constriction (dip and plateau RV filling pattern and prominent y  descent on RA tracing).  However, similar findings could be seen with restrictive cardiomyopathy.  Unfortunately, given mechanical aortic valve, I was unable to cross into the left ventricle to get simultaneous LV and RV tracings.  - Given the totality of evidence, I am concerned for constrictive pericarditis.  I will review with review with my colleagues and a cardiac surgeon.  He has lost considerable weight with diuresis  but has not felt better, I am concerned that he may need pericardial stripping.  5. Given severe chronotropic incompetence noted on CPX, I will decreased metoprolol to 12.5 mg bid. HR higher today.   Followup in 3 wks.   Loralie Champagne 02/20/2017

## 2017-03-03 NOTE — Progress Notes (Signed)
[facility] for warfarin Indication: mechanical AVR   Assessment: 75 yom with hx of mechanical AVR on warfarin PTA admitted with HB. Pt has been bridged with Lovenox PTA - no further need for bridging per Dr. Aundra Dubin. Pharmacy consulted to dose warfarin while inpatient. Pt to have PPM implanted per EP. INR subtherapeutic on admit 1.26. Hg 11.9, plt 120. No bleed documented.   PTA warfarin dose: 3mg  daily (last dose 5/18 - holding for procedure PTA)  Goal of Therapy:  INR 2-3 Monitor platelets by anticoagulation protocol: Yes   Plan:  Warfarin 4.5mg  PO x 1 dose Daily INR Monitor CBC, s/sx bleeding  Elicia Lamp, PharmD, BCPS Clinical Pharmacist 03/03/2017 8:50 PM

## 2017-03-03 NOTE — Progress Notes (Addendum)
Site area: rt groin 2 fv sheaths Site Prior to Removal:  Level 0 Pressure Applied For:  60  minutes Manual:   yes Patient Status During Pull:   stable Post Pull Site:  Level  0 Post Pull Instructions Given:  yes Post Pull Pulses Present: yes Dressing Applied:  yes Bedrest begins @  1510 Comments:

## 2017-03-04 ENCOUNTER — Encounter (HOSPITAL_COMMUNITY): Payer: Self-pay | Admitting: Internal Medicine

## 2017-03-04 ENCOUNTER — Inpatient Hospital Stay (HOSPITAL_COMMUNITY): Payer: 59

## 2017-03-04 ENCOUNTER — Encounter (HOSPITAL_COMMUNITY): Payer: Self-pay

## 2017-03-04 ENCOUNTER — Encounter (HOSPITAL_COMMUNITY)
Admission: RE | Disposition: A | Discharge status: Home / Self Care | Payer: Self-pay | Source: Ambulatory Visit | Attending: Cardiology

## 2017-03-04 DIAGNOSIS — R001 Bradycardia, unspecified: Secondary | ICD-10-CM

## 2017-03-04 DIAGNOSIS — I5033 Acute on chronic diastolic (congestive) heart failure: Secondary | ICD-10-CM

## 2017-03-04 DIAGNOSIS — T82120A Displacement of cardiac electrode, initial encounter: Secondary | ICD-10-CM

## 2017-03-04 HISTORY — PX: LEAD REVISION/REPAIR: EP1213

## 2017-03-04 LAB — GLUCOSE, CAPILLARY
GLUCOSE-CAPILLARY: 106 mg/dL — AB (ref 65–99)
GLUCOSE-CAPILLARY: 109 mg/dL — AB (ref 65–99)
GLUCOSE-CAPILLARY: 171 mg/dL — AB (ref 65–99)

## 2017-03-04 LAB — BASIC METABOLIC PANEL
Anion gap: 6 (ref 5–15)
BUN: 19 mg/dL (ref 6–20)
CALCIUM: 8.9 mg/dL (ref 8.9–10.3)
CHLORIDE: 104 mmol/L (ref 101–111)
CO2: 29 mmol/L (ref 22–32)
Creatinine, Ser: 1.13 mg/dL (ref 0.61–1.24)
GFR calc non Af Amer: >60 mL/min (ref >60)
Glucose, Bld: 123 mg/dL — ABNORMAL HIGH (ref 65–99)
Potassium: 3.3 mmol/L — ABNORMAL LOW (ref 3.5–5.1)
SODIUM: 139 mmol/L (ref 135–145)

## 2017-03-04 LAB — PROTIME-INR
INR: 1.2
PROTHROMBIN TIME: 15.2 s (ref 11.4–15.2)

## 2017-03-04 LAB — HIV ANTIBODY (ROUTINE TESTING W REFLEX): HIV Screen 4th Generation wRfx: NONREACTIVE

## 2017-03-04 SURGERY — LEAD REVISION/REPAIR

## 2017-03-04 MED ORDER — HEPARIN (PORCINE) IN NACL 2-0.9 UNIT/ML-% IJ SOLN
INTRAMUSCULAR | Status: AC
  Filled 2017-03-04: qty 500

## 2017-03-04 MED ORDER — ONDANSETRON HCL 4 MG/2ML IJ SOLN: 4.0000 mg | Freq: Four times a day (QID) | INTRAMUSCULAR | Status: DC | PRN

## 2017-03-04 MED ORDER — CEFAZOLIN SODIUM-DEXTROSE 2-4 GM/100ML-% IV SOLN
2.0000 g | INTRAVENOUS | Status: AC
  Administered 2017-03-04: 2 g via INTRAVENOUS

## 2017-03-04 MED ORDER — FENTANYL CITRATE (PF) 100 MCG/2ML IJ SOLN
INTRAMUSCULAR | Status: AC
  Filled 2017-03-04: qty 2

## 2017-03-04 MED ORDER — ACETAMINOPHEN 325 MG PO TABS: 325.0000 mg | ORAL_TABLET | ORAL | Status: DC | PRN

## 2017-03-04 MED ORDER — CHLORHEXIDINE GLUCONATE 4 % EX LIQD
60.0000 mL | Freq: Once | CUTANEOUS | Status: AC
  Administered 2017-03-04: 4 via TOPICAL

## 2017-03-04 MED ORDER — CHLORHEXIDINE GLUCONATE 4 % EX LIQD: 60.0000 mL | Freq: Once | CUTANEOUS | Status: AC

## 2017-03-04 MED ORDER — SODIUM CHLORIDE 0.9 % IR SOLN
80.0000 mg | Status: AC
  Administered 2017-03-04: 80 mg

## 2017-03-04 MED ORDER — SODIUM CHLORIDE 0.9 % IR SOLN
Status: AC
  Filled 2017-03-04: qty 2

## 2017-03-04 MED ORDER — FENTANYL CITRATE (PF) 100 MCG/2ML IJ SOLN
INTRAMUSCULAR | Status: DC | PRN
Start: 2017-03-04 — End: 2017-03-04
  Administered 2017-03-04 (×2): 25 ug via INTRAVENOUS
  Administered 2017-03-04: 12.5 ug via INTRAVENOUS

## 2017-03-04 MED ORDER — POTASSIUM CHLORIDE CRYS ER 20 MEQ PO TBCR
40.0000 meq | EXTENDED_RELEASE_TABLET | Freq: Two times a day (BID) | ORAL | Status: DC
  Administered 2017-03-04 – 2017-03-05 (×3): 40 meq via ORAL
  Filled 2017-03-04 (×3): qty 2

## 2017-03-04 MED ORDER — CEFAZOLIN SODIUM-DEXTROSE 2-4 GM/100ML-% IV SOLN
INTRAVENOUS | Status: AC
  Filled 2017-03-04: qty 100

## 2017-03-04 MED ORDER — CEFAZOLIN SODIUM-DEXTROSE 1-4 GM/50ML-% IV SOLN
1.0000 g | Freq: Four times a day (QID) | INTRAVENOUS | Status: AC
  Administered 2017-03-04 – 2017-03-05 (×3): 1 g via INTRAVENOUS
  Filled 2017-03-04 (×3): qty 50

## 2017-03-04 MED ORDER — WARFARIN SODIUM 3 MG PO TABS
3.0000 mg | ORAL_TABLET | Freq: Every day | ORAL | Status: DC
  Filled 2017-03-04: qty 1

## 2017-03-04 MED ORDER — HEPARIN (PORCINE) IN NACL 2-0.9 UNIT/ML-% IJ SOLN
INTRAMUSCULAR | Status: AC | PRN
  Administered 2017-03-04: 500 mL

## 2017-03-04 MED ORDER — MIDAZOLAM HCL 5 MG/5ML IJ SOLN
INTRAMUSCULAR | Status: DC | PRN
  Administered 2017-03-04: 2 mg via INTRAVENOUS
  Administered 2017-03-04: 1 mg via INTRAVENOUS
  Administered 2017-03-04: 2 mg via INTRAVENOUS

## 2017-03-04 MED ORDER — LIDOCAINE HCL (PF) 1 % IJ SOLN
INTRAMUSCULAR | Status: DC | PRN
  Administered 2017-03-04: 45 mL via INTRADERMAL

## 2017-03-04 MED ORDER — SODIUM CHLORIDE 0.9% FLUSH: 3.0000 mL | INTRAVENOUS | Status: DC | PRN

## 2017-03-04 MED ORDER — LIDOCAINE HCL (PF) 1 % IJ SOLN
INTRAMUSCULAR | Status: AC
  Filled 2017-03-04: qty 60

## 2017-03-04 MED ORDER — SODIUM CHLORIDE 0.9% FLUSH
3.0000 mL | Freq: Two times a day (BID) | INTRAVENOUS | Status: DC
  Administered 2017-03-04: 3 mL via INTRAVENOUS

## 2017-03-04 MED ORDER — MIDAZOLAM HCL 5 MG/5ML IJ SOLN
INTRAMUSCULAR | Status: AC
  Filled 2017-03-04: qty 5

## 2017-03-04 MED ORDER — SODIUM CHLORIDE 0.9 % IV SOLN
INTRAVENOUS | Status: DC
  Administered 2017-03-04: 12:00:00 via INTRAVENOUS

## 2017-03-04 MED ORDER — ACETAMINOPHEN 325 MG PO TABS
650.0000 mg | ORAL_TABLET | ORAL | Status: DC | PRN
  Administered 2017-03-05: 07:00:00 650 mg via ORAL
  Filled 2017-03-04: qty 2

## 2017-03-04 MED FILL — Heparin Sodium (Porcine) 2 Unit/ML in Sodium Chloride 0.9%: INTRAMUSCULAR | Qty: 500 | Status: AC

## 2017-03-04 SURGICAL SUPPLY — 3 items
CABLE SURGICAL S-101-97-12 (CABLE) ×2 IMPLANT
PAD DEFIB LIFELINK (PAD) ×2 IMPLANT
TRAY PACEMAKER INSERTION (PACKS) ×2 IMPLANT

## 2017-03-04 NOTE — Progress Notes (Signed)
Patient ID: Mark Choi, male   DOB: 05/08/1966, 51 y.o.   MRN: [id number]   SUBJECTIVE: Patient had Medtr[employer] PPM placed yesterday with His bundle lead.  Today, he is His pacing with narrowing of the QRS from baseline.  However, atrial lead appears to be dislodged.    Overall, he feels better now that he is pacing.  He diuresed some yesterday, weights do not appear accurate.   Scheduled Meds: . aspirin EC  81 mg Oral Daily  . buPROPion  150 mg Oral Daily  . canagliflozin  100 mg Oral QAC breakfast   And  . [START ON 03/05/2017] metFORMIN  1,000 mg Oral BID WC  . cholecalciferol  5,000 Units Oral Daily  . escitalopram  20 mg Oral Daily  . furosemide  60 mg Intravenous BID  . gabapentin  600 mg Oral QHS  . losartan  25 mg Oral QHS  . multivitamin with minerals  1 tablet Oral Daily  . off the beat book   Does not apply Once  . pantoprazole  40 mg Oral Daily  . potassium chloride  40 mEq Oral BID  . rosuvastatin  40 mg Oral QHS  . sodium chloride flush  3 mL Intravenous Q12H  . testosterone  10 g Transdermal Daily  . Warfarin - Pharmacist Dosing Inpatient   Does not apply q1800  . you have a pacemaker book   Does not apply Once   Continuous Infusions: . sodium chloride    .  ceFAZolin (ANCEF) Choi 1 g (03/04/17 0639)   PRN Meds:.sodium chloride, acetaminophen, acetaminophen, nitroGLYCERIN, ondansetron (ZOFRAN) Choi, sodium chloride flush    Vitals:   03/04/17 0200 03/04/17 0455 03/04/17 0635 03/04/17 0700  BP: 126/61 126/63 (!) 119/59 (!) 119/54  Pulse: 60 (!) 59 60 60  Resp: 16 15 16 13   Temp: 97.6 F (36.4 C) 97.8 F (36.6 C)  97.8 F (36.6 C)  TempSrc: Oral Oral  Oral  SpO2: 93% 94% 95% 95%  Weight: 194 lb 7.1 oz (88.2 kg)     Height:        Intake/Output Summary (Last 24 hours) at 03/04/17 0857 Last data filed at 03/04/17 0852  Gross per 24 hour  Intake             1103 ml  Output             1375 ml  Net             -272 ml    LABS: Basic Metabolic  Panel:  Recent Labs  03/03/17 1037 03/04/17 0433  NA 140 139  K 3.7 3.3*  CL 104 104  CO2 28 29  GLUCOSE 132* 123*  BUN 19 19  CREATININE 1.19 1.13  CALCIUM 9.1 8.9   Liver Function Tests: No results for input(s): AST, ALT, ALKPHOS, BILITOT, PROT, ALBUMIN in the last 72 hours. No results for input(s): LIPASE, AMYLASE in the last 72 hours. CBC:  Recent Labs  03/03/17 1037  WBC 12.3*  HGB 11.9*  HCT 36.6*  MCV 84.1  PLT 120*   Cardiac Enzymes: No results for input(s): CKTOTAL, CKMB, CKMBINDEX, TROPONINI in the last 72 hours. BNP: Invalid input(s): POCBNP D-Dimer: No results for input(s): DDIMER in the last 72 hours. Hemoglobin A1C: No results for input(s): HGBA1C in the last 72 hours. Fasting Lipid Panel: No results for input(s): CHOL, HDL, LDLCALC, TRIG, CHOLHDL, LDLDIRECT in the last 72 hours. Thyroid Function Tests: No results for input(s):  TSH, T4TOTAL, T3FREE, THYROIDAB in the last 72 hours.  Invalid input(s): FREET3 Anemia Panel: No results for input(s): VITAMINB12, FOLATE, FERRITIN, TIBC, IRON, RETICCTPCT in the last 72 hours.  RADIOLOGY: Dg Chest 2 View  Result Date: 03/04/2017 CLINICAL DATA:  Pacemaker placement. EXAM: CHEST  2 VIEW COMPARISON:  CT 02/04/2017.  Chest x-ray 08/25/2016 . FINDINGS: Surgical clips right upper chest. Stable mediastinal calcification as noted on prior CT. Cardiac pacer noted with lead tips in the right atrium and right ventricle. Prior CABG and cardiac valve replacement. Heart size stable. Stable interstitial process is noted on prior CT. Stable right sided diffuse pleural thickening. Able bilateral apical pleural thickening. No pneumothorax. No acute bony abnormality. IMPRESSION: Cardiac pacer stable position. Prior CABG. Cardiac valve in stable position. 2. Stable interstitial process unchanged from prior exams. Stable diffuse right pleural thickening. Represent made to prior recent CT report of 02/04/2017. No acute abnormality  identified. Electronically Signed   By: Marcello Moores  Register   On: 03/04/2017 07:15   Ct Chest High Resolution  Result Date: 02/04/2017 CLINICAL DATA:  51 year old male with history of dyspnea for 1 year. Evaluate for pulmonary fibrosis. EXAM: CT CHEST WITHOUT CONTRAST TECHNIQUE: Multidetector CT imaging of the chest was performed following the standard protocol without intravenous contrast. High resolution imaging of the lungs, as well as inspiratory and expiratory imaging, was performed. COMPARISON:  Chest CT 03/10/2016. FINDINGS: Cardiovascular: Heart size is normal. There is no significant pericardial fluid, thickening or pericardial calcification. There is aortic atherosclerosis, as well as atherosclerosis of the great vessels of the mediastinum and the coronary arteries, including calcified atherosclerotic plaque in the left main, left anterior descending, left circumflex and right coronary arteries. Status post median sternotomy for CABG including LIMA to the LAD. 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 Status post aortic valve replacement with a mechanical aortic valve. Mitral annular calcifications. Mediastinum/Nodes: No pathologically enlarged mediastinal or hilar lymph nodes. Please note that accurate exclusion of hilar adenopathy is limited on noncontrast CT scans. Esophagus is unremarkable in appearance. No axillary lymphadenopathy. Lungs/Pleura: Extensive pleural thickening calcification in the right hemithorax, similar to prior study from 2017, with evidence of chronic volume loss throughout the periphery of the right lung, indicative of a fibrothorax. Trace left pleural effusion. No left pleural calcifications are noted. High-resolution images demonstrate relatively diffuse ground-glass attenuation  throughout the lungs bilaterally which appears relatively similar to the prior examination. In the medial aspects of both lungs there are some focal areas of septal thickening and architectural distortion, presumably related to postradiation fibrosis in this patient with history of Hodgkin's disease. No significant traction bronchiectasis or frank honeycombing. Inspiratory and expiratory imaging demonstrates mild to moderate air trapping, indicative of small airways disease. 8 x 7 mm pulmonary nodule in the posterior aspect of the left upper lobe (image 71 of series 5) is unchanged, presumably benign. No other suspicious appearing pulmonary nodules or masses are noted. Upper Abdomen: Multiple tiny partially calcified gallstones are noted within the dependent portion of the gallbladder. No findings to suggest an acute cholecystitis at this time. Musculoskeletal: There are no aggressive appearing lytic or blastic lesions noted in the visualized portions of the skeleton. IMPRESSION: 1. Unusual appearance of the lungs with relatively diffuse ground-glass attenuation. This pattern can be seen in the setting of a smoking related disease  such as desquamative interstitial pneumonia (DIP), however, in this non smoking patient with history of prior radiation therapy for Hodgkin's disease, these findings are favored to reflect chronic pulmonary edema likely related to chronic lymphatic congestion from prior radiation therapy. Overall, the appearance is very similar to remote prior study 03/10/2016. 2. Right-sided fibrothorax again noted. 3. 8 x 7 mm left upper lobe pulmonary nodule is unchanged compared to 2017, presumably benign. 4. Aortic atherosclerosis, in addition to left main and 3 vessel coronary artery disease. Status post median sternotomy for CABG including LIMA to the LAD, as well as mechanical aortic valve replacement. 5. Cholelithiasis without evidence of acute cholecystitis at this time. Electronically Signed    By: Vinnie Langton M.D.   On: 02/04/2017 16:05    PHYSICAL EXAM General: NAD Neck: JVP 8 cm, no thyromegaly or thyroid nodule.  Lungs: Clear to auscultation bilaterally with normal respiratory effort. CV: Nondisplaced PMI.  Heart regular S1/S2 with mechanical S2, no S3/S4, 3/6 SEM RUSB.  No peripheral edema.   Abdomen: Soft, nontender, no hepatosplenomegaly, no distention.  Neurologic: Alert and oriented x 3.  Psych: Normal affect. Extremities: No clubbing or cyanosis.   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  He was noted to have 2:1 AV block with HR in 40s and LBBB.  1. 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  Baseline ECG with 1st degree AVB, LBBB.  2:1 block is new from last office visit on 5/11.  He has history of chronotropic incompetence on CPX.  He was on low dose metoprolol in the past but now off. Suspect this is progression of conduction system disease, possibly related to his prior radiation.  I discussed with Dr. Lovena Le, given baseline abnormality this is unlikely to improve (or if it does improve, will not improve for long).  Medtronic PPM with His bundle lead placed 5/24.  He has restarted warfarin but will not bridge with Lovenox.  QRS has narrowed with His bundle pacing.  - Possible atrial lead dislodgement, may return to lab this afternoon for revision.  2. Mechanical aortic valve: Normal function on 4/18 echo.  Warfarin restarted, need INR this morning (ordered).  3. Restrictive cardiomyopathy/constrictive pericarditis: Echo and cardiac MRI did show some concerning signs for constrictive pericarditis with inspiratory septal shift though this was not marked.  There was also some pericardial thickening on echo  and chest CT. Today, he had simultaneous left and right heart cath with trans-septal puncture given mechanical aortic valve to more clearly delineate restrictive CMP versus constrictive pericarditis.  This showed elevated pressures throughout with diastolic equalization, there was a dip and plateau pattern in the LV and RV tracings, there was a rapid y descent in the RA pressure tracing.  However, there was no clear ventricular interdependence => LV and RV pressures seemed to track together with inspiration rather than in opposite directions.  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  Suspect this is due to prior chest radiation.  He is markedly symptomatic, NYHA class Choi.  I am concerned that pericardial stripping alone is not going to alleviate his symptoms.  I think that his best option will be evaluation in the transplant clinic at [facility]  as the only effective treatment for severely symptomatic restrictive cardiomyopathy is likely going to be transplant.  - I talked with Dr Melvyn Novas, have referred Mr Goheen to transplant evaluation with him.  - Given elevated filling pressures, will diurese with Choi Lasix while in-house.  Continue Lasix 60 mg Choi bid today and replace K.  4. 1. CAD: s/p CABG. Cath in 7/17 with stable coronaries and grafts. Patient had already started to develop dyspnea at that time, so doubt progressive CAD as cause of symptoms.  - Continue ASA 81, statin.   Loralie Champagne 03/04/2017 9:04 AM

## 2017-03-04 NOTE — Progress Notes (Unsigned)
Transplant referral packet faxed to [facility] Attn: Dr. Melvyn Novas per Dr. Larey Dresser request. 58 total pages faxed to [phone]  Renee Pain, RN

## 2017-03-04 NOTE — Progress Notes (Signed)
[place] for warfarin Indication: mechanical AVR   Assessment: 35 yom with hx of mechanical AVR on warfarin PTA admitted with HB. Pt has been bridged with Lovenox PTA - no further need for bridging per Dr. Aundra Dubin. Pharmacy consulted to dose warfarin while inpatient. S/p PPM implanted per EP, pending lead revision today. INR 1.2 today. Hg 11.9, plt 120. No bleed documented. Likely discharge tomorrow  PTA warfarin dose: 3mg  daily (last dose 5/18 - holding for procedure PTA) Per coumadin clinic record, his INR has been stable and therapeutic at higher end goal on 3mg  daily  Goal of Therapy:  INR 2-3 Monitor platelets by anticoagulation protocol: Yes   Plan:  Warfarin 3mg  PO daily Daily INR Monitor CBC, s/sx bleeding  Mark Choi, PharmD, BCPS  Clinical Pharmacist  Pager: [phone]   03/04/2017 10:56 AM

## 2017-03-04 NOTE — Progress Notes (Signed)
Electrophysiology Rounding Note  Patient Name: Mark Choi Date of Encounter: 03/04/2017  Primary Cardiologist: Nahser Heart Failure: Aundra Dubin Electrophysiologist: Lovena Le   Subjective   The patient is doing well today.  At this time, the patient denies chest pain, shortness of breath, or any new concerns.  Inpatient Medications    Scheduled Meds: . aspirin EC  81 mg Oral Daily  . buPROPion  150 mg Oral Daily  . canagliflozin  100 mg Oral QAC breakfast   And  . [START ON 03/05/2017] metFORMIN  1,000 mg Oral BID WC  . cholecalciferol  5,000 Units Oral Daily  . escitalopram  20 mg Oral Daily  . furosemide  60 mg Intravenous BID  . gabapentin  600 mg Oral QHS  . losartan  25 mg Oral QHS  . multivitamin with minerals  1 tablet Oral Daily  . off the beat book   Does not apply Once  . pantoprazole  40 mg Oral Daily  . potassium chloride  10 mEq Oral BID  . rosuvastatin  40 mg Oral QHS  . sodium chloride flush  3 mL Intravenous Q12H  . testosterone  10 g Transdermal Daily  . Warfarin - Pharmacist Dosing Inpatient   Does not apply q1800  . you have a pacemaker book   Does not apply Once   Continuous Infusions: . sodium chloride    .  ceFAZolin (ANCEF) Choi Stopped (03/04/17 0101)   PRN Meds: sodium chloride, acetaminophen, acetaminophen, nitroGLYCERIN, ondansetron (ZOFRAN) Choi, sodium chloride flush   Vital Signs    Vitals:   03/03/17 2335 03/04/17 0030 03/04/17 0200 03/04/17 0455  BP: (!) 147/79 (!) 145/64 126/61 126/63  Pulse: (!) 59 60 60 (!) 59  Resp: 15 (!) 21 16 15   Temp:  97.8 F (36.6 C) 97.6 F (36.4 C) 97.8 F (36.6 C)  TempSrc:  Oral Oral Oral  SpO2: 93% 95% 93% 94%  Weight:   194 lb 7.1 oz (88.2 kg)   Height:        Intake/Output Summary (Last 24 hours) at 03/04/17 0636 Last data filed at 03/04/17 0500  Gross per 24 hour  Intake              503 ml  Output             1150 ml  Net             -647 ml   Filed Weights   03/03/17 0933  03/03/17 2045 03/04/17 0200  Weight: 185 lb (83.9 kg) 190 lb 0.6 oz (86.2 kg) 194 lb 7.1 oz (88.2 kg)    Physical Exam    GEN- The patient is well appearing, alert and oriented x 3 today.   Head- normocephalic, atraumatic Eyes-  Sclera clear, conjunctiva pink Ears- hearing intact Oropharynx- clear Neck- supple Lungs- Clear to ausculation bilaterally, normal work of breathing Heart- Regular rate and rhythm (paced) GI- soft, NT, ND, + BS Extremities- no clubbing, cyanosis, or edema Skin- no rash or lesion Psych- euthymic mood, full affect Neuro- strength and sensation are intact  Labs    CBC  Recent Labs  03/03/17 1037  WBC 12.3*  HGB 11.9*  HCT 36.6*  MCV 84.1  PLT 979*   Basic Metabolic Panel  Recent Labs  03/03/17 1037 03/04/17 0433  NA 140 139  K 3.7 3.3*  CL 104 104  CO2 28 29  GLUCOSE 132* 123*  BUN 19 19  CREATININE 1.19 1.13  CALCIUM 9.1 8.9     Telemetry    Sinus rhythm with atrial undersensing and inappropriate A pacing (personally reviewed)  Radiology    No results found.   Patient Profile     Mark Choi is a 51 y.o. male with a past medical history significant for CAD s/p CABG, Hodgkins lymphoma treated with radiation, mechanical aortic valve on coumadin, and progressive dyspnea.  He was admitted for RHC and found to have 2:1 AV block with no reversible causes identified. He underwent dual chamber PPM placement 03/04/17.   Assessment & Plan    1. 2:1 AV block S/p PPM 03/04/17 CXR this morning with atrial lead dislodgement Atrial undersensing on telemetry with inappropriate A pacing Device interrogation pending Will require atrial lead revision.  Orders entered. Risks, benefits reviewed.   2.  Mechanical AVR Resume Warfarin at home dose No Lovenox bridge  3.  Restrictive cardiomyopathy/constrictive pericarditis Per AHF team  Dr Lovena Le to see later this morning.   Signed, Chanetta Marshall, NP  03/04/2017, 6:36 AM   EP  Attending  Patient seen and examined. Agree with above. He has developed atrial lead dislodgement and will undergo atrial lead revision later today.   Mikle Bosworth.D.

## 2017-03-04 NOTE — Care Management Note (Signed)
Case Management Note  Patient Details  Name: Hugo Lybrand MRN: [id number] Date of Birth: 1966-08-14  Subjective/Objective:   S/p pacemaker implant.     PCP listed is Rachell Cipro               Action/Plan: NCM will follow for dc needs.  Expected Discharge Date:                  Expected Discharge Plan:  Home/Self Care  In-House Referral:     Discharge planning Services  CM Consult  Post Acute Care Choice:    Choice offered to:     DME Arranged:    DME Agency:     HH Arranged:    HH Agency:     Status of Service:  In process, will continue to follow  If discussed at Long Length of Stay Meetings, dates discussed:    Additional Comments:  Zenon Mayo, RN 03/04/2017, 9:22 AM

## 2017-03-04 NOTE — Progress Notes (Signed)
Pt to xray via w/c with R U E in sling & elevated on pillow. Good capillary refill. Small amt of drainage noted on dsg. Pt presently 100% AV paced. VSS

## 2017-03-05 ENCOUNTER — Inpatient Hospital Stay (HOSPITAL_COMMUNITY): Payer: 59

## 2017-03-05 ENCOUNTER — Encounter (HOSPITAL_COMMUNITY): Payer: Self-pay | Admitting: Nurse Practitioner

## 2017-03-05 DIAGNOSIS — Z952 Presence of prosthetic heart valve: Secondary | ICD-10-CM

## 2017-03-05 DIAGNOSIS — I425 Other restrictive cardiomyopathy: Secondary | ICD-10-CM | POA: Diagnosis present

## 2017-03-05 LAB — PROTIME-INR
INR: 1.29
Prothrombin Time: 16.1 seconds — ABNORMAL HIGH (ref 11.4–15.2)

## 2017-03-05 LAB — BASIC METABOLIC PANEL
Anion gap: 10 (ref 5–15)
BUN: 14 mg/dL (ref 6–20)
CHLORIDE: 104 mmol/L (ref 101–111)
CO2: 24 mmol/L (ref 22–32)
CREATININE: 1.1 mg/dL (ref 0.61–1.24)
Calcium: 9.3 mg/dL (ref 8.9–10.3)
GFR calc Af Amer: >60 mL/min (ref >60)
GFR calc non Af Amer: >60 mL/min (ref >60)
Glucose, Bld: 136 mg/dL — ABNORMAL HIGH (ref 65–99)
Potassium: 3.9 mmol/L (ref 3.5–5.1)
SODIUM: 138 mmol/L (ref 135–145)

## 2017-03-05 LAB — CBC
HEMATOCRIT: 39.6 % (ref 39.0–52.0)
Hemoglobin: 12.6 g/dL — ABNORMAL LOW (ref 13.0–17.0)
MCH: 26.9 pg (ref 26.0–34.0)
MCHC: 31.8 g/dL (ref 30.0–36.0)
MCV: 84.4 fL (ref 78.0–100.0)
PLATELETS: 155 10*3/uL (ref 150–400)
RBC: 4.69 MIL/uL (ref 4.22–5.81)
RDW: 14.5 % (ref 11.5–15.5)
WBC: 16.4 10*3/uL — ABNORMAL HIGH (ref 4.0–10.5)

## 2017-03-05 LAB — GLUCOSE, CAPILLARY: GLUCOSE-CAPILLARY: 152 mg/dL — AB (ref 65–99)

## 2017-03-05 MED ORDER — FUROSEMIDE 40 MG PO TABS: 80.0000 mg | ORAL_TABLET | Freq: Two times a day (BID) | ORAL | 6 refills | Status: DC

## 2017-03-05 MED ORDER — POTASSIUM CHLORIDE ER 10 MEQ PO TBCR: 20.0000 meq | EXTENDED_RELEASE_TABLET | Freq: Two times a day (BID) | ORAL | 3 refills | Status: DC

## 2017-03-05 NOTE — Discharge Instructions (Signed)
**PLEASE REMEMBER TO BRING ALL OF YOUR MEDICATIONS TO EACH OF YOUR FOLLOW-UP OFFICE VISITS.  Groin Site Care Refer to this sheet in the next few weeks. These instructions provide you with information on caring for yourself after your procedure. Your caregiver may also give you more specific instructions. Your treatment has been planned according to current medical practices, but problems sometimes occur. Call your caregiver if you have any problems or questions after your procedure. HOME CARE INSTRUCTIONS  You may shower 24 hours after the procedure. Remove the bandage (dressing) and gently wash the site with plain soap and water. Gently pat the site dry.   Do not apply powder or lotion to the site.   Do not sit in a bathtub, swimming pool, or whirlpool for 5 to 7 days.   No bending, squatting, or lifting anything over 10 pounds (4.5 kg) as directed by your caregiver.   Inspect the site at least twice daily.   Do not drive home if you are discharged the same day of the procedure. Have someone else drive you.   What to expect:  Any bruising will usually fade within 1 to 2 weeks.   Blood that collects in the tissue (hematoma) may be painful to the touch. It should usually decrease in size and tenderness within 1 to 2 weeks.  SEEK IMMEDIATE MEDICAL CARE IF:  You have unusual pain at the groin site or down the affected leg.   You have redness, warmth, swelling, or pain at the groin site.   You have drainage (other than a small amount of blood on the dressing).   You have chills.   You have a fever or persistent symptoms for more than 72 hours.   You have a fever and your symptoms suddenly get worse.   Your leg becomes pale, cool, tingly, or numb.  You have heavy bleeding from the site. Hold pressure on the site. . _____________     Supplemental Discharge Instructions for  Pacemaker/Defibrillator Patients  Activity No heavy lifting or vigorous activity with your left/right  arm for 6 to 8 weeks.  Do not raise your left/right arm above your head for one week.  Gradually raise your affected arm as drawn below.           __      5/31             /        5/31           /           6/1        /        6/2           NO DRIVING for         ; you may begin driving on   6/2  .  WOUND CARE - Keep the wound area clean and dry.  Do not get this area wet for one week. No showers for one week; you may shower on     . - The tape/steri-strips on your wound will fall off; do not pull them off.  No bandage is needed on the site.  DO  NOT apply any creams, oils, or ointments to the wound area. - If you notice any drainage or discharge from the wound, any swelling or bruising at the site, or you develop a fever > 101? F after you are discharged home, call the office at once.  Special  Instructions - You are still able to use cellular telephones; use the ear opposite the side where you have your pacemaker/defibrillator.  Avoid carrying your cellular phone near your device. - When traveling through airports, show security personnel your identification card to avoid being screened in the metal detectors.  Ask the security personnel to use the hand wand. - Avoid arc welding equipment, MRI testing (magnetic resonance imaging), TENS units (transcutaneous nerve stimulators).  Call the office for questions about other devices. - Avoid electrical appliances that are in poor condition or are not properly grounded. - Microwave ovens are safe to be near or to operate.  DO NOT DRIVE YOURSELF OR A FAMILY MEMBER WITH A DEFIBRILLATOR TO THE HOSPITAL--CALL 911.

## 2017-03-05 NOTE — Discharge Summary (Signed)
Discharge Summary    Patient ID: Mark Choi,  MRN: [id number], DOB/AGE: Sep 28, 1966 51 y.o.  Admit date: 03/03/2017 Discharge date: 03/05/2017  Primary Care Provider: Fanny Bien Primary Cardiologist: Joaquim Nam, MD / Einar Crow, MD - CHF / G. Lovena Le, MD - EP  Discharge Diagnoses    Principal Problem:   Heart block, AV.  **S/P Medtronic PPM this admission.  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    Fick Cardiac Output 4.88 L/min  Fick Cardiac Output Index 2.42 (L/min)/BSA  RA A Wave 15 mmHg  RA V Wave 15 mmHg  RA Mean 17 mmHg  RV Systolic Pressure 48 mmHg  RV Diastolic Pressure 6 mmHg  RV EDP 15 mmHg  PA Systolic Pressure 40 mmHg  PA Diastolic Pressure 6 mmHg  PA Mean 19 mmHg  PW A Wave 22 mmHg  PW V Wave 37 mmHg  PW Mean 25 mmHg  LV Systolic Pressure 151 mmHg  LV Diastolic Pressure 9 mmHg  LV EDP 22 mmHg  Left Atrium Extended Mean Pressure 21 mmHg  Left Atrium Extended A WAVE Pressure 26 mmHg  Left Atrium Extended V WAVE Pressure 21 mmHg  QP/QS 1  TPVR Index 10.75 HRUI  _____________  Permanent Pacemaker Placement 5.24.2018  CONCLUSIONS:   1. Successful implantation of a Medtronic dual-chamber pacemaker (serial # [id number]) with His bundle pacing for symptomatic bradycardia  due to symptomatic 2:1 AV block and LBBB.  2. No early apparent complications.  _____________   Right Atrial Lead Revision 5.25.2018 _____________    History of Present Illness     51 y.o. male with hx CAD s/p CABG, Hodgkins lymphoma treated with radiation, mechanical aortic valve on coumadin. Patient had mantle radiation for Hodgkins lymphoma at age 9.  In his 74s, he had CABG (1998).  In 2010, he had mechanical aortic valve replacement.  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  He has had progressive dyspnea and has been evaluated in CHF clniic with CPX testing in 01/2017, which Was more suggestive of a ventilatory limitation with severe restrictive lung physiology. Right heart catheterization was repeated in April 2018 showing elevated filling pressures however the aortic valve was unable to be crossed in the setting of a mechanical prosthesis and therefore simultaneous left ventricular and right ventricular pressures were not able to be obtained. Arrangements were then made for right and left heart catheterization with transseptal puncture for left atrial pressure measurements.  Hospital Course     Consultants: Electrophysiology   Patient presented to the [facility] cardiac catheterization laboratory on May 24 and underwent right and left heart cardiac catheterization with transseptal puncture. This showed elevated right heart pressures with diastolic equalization as outlined above. There was no clear ventricular inter-dependence. It was felt that there may be a degree of constrictive pericarditis that restrictive cardiomyopathy was also  likely present. Post catheterization, he was noted to have 2-1 heart block. He remained bradycardic and felt poorly. He was admitted and seen by electrophysiology with recommendation for primary pacemaker placement. He underwent Medtronic permanent pacemaker placement on May 24 with a history bundle lead placed. Follow-up chest x-ray May  25 showed right atrial lead dislodgment and he underwent successful right atrial lead revision on May 25.  In the setting of elevated right heart pressures, he was placed on IV Lasix during this admission. He had good diuresis with some symptomatic improvement. He is -4.9 L for admission and weight is down from a peak of 194 pounds to 186 pounds this morning. He has been ambulating postprocedure and is ready for discharge this morning. Chest x-ray shows stable lead placement. We will increase his outpatient Lasix dose to 80 mg twice a day. In so doing, we will also increase his home dose of potassium to 20 mg once twice a day. He will be discharged home today in good condition. He will continue on Coumadin therapy without a bridge with plan for INR follow-up on May 30. He will also be seen in heart failure clinic that day. In the setting of restrictive cardiomyopathy, referral has been made to the cardiac transplant team at [facility]. _____________  Physical Exam   Discharge Vitals Blood pressure (!) 106/52, pulse (!) 104, temperature 98 F (36.7 C), temperature source Oral, resp. rate (!) 24, height 5' 10.5" (1.791 m), weight 186 lb 8.2 oz (84.6 kg), SpO2 98 %.  Filed Weights   03/03/17 2045 03/04/17 0200 03/05/17 0232  Weight: 190 lb 0.6 oz (86.2 kg) 194 lb 7.1 oz (88.2 kg) 186 lb 8.2 oz (84.6 kg)    GEN: Well nourished, well developed, in no acute distress.  HEENT: Grossly normal.  Neck: Supple, no JVD, carotid bruits, or masses. Cardiac: RRR, mech S2, 2/6 SEM heard throughout, no rubs, or gallops. No clubbing, cyanosis, edema.  Radials/DP/PT 2+ and equal bilaterally. L upper chest pacer site is mildly tender w/o bleeding/hematoma. Respiratory:  Respirations regular and unlabored, clear to auscultation bilaterally. GI: Soft, nontender, nondistended, BS + x 4. MS: no deformity or atrophy. Skin: warm and dry, no rash. Neuro:  Strength and sensation are intact. Psych: AAOx3.  Normal  affect.  Labs & Radiologic Studies    CBC  Recent Labs  03/03/17 1037 03/05/17 0543  WBC 12.3* 16.4*  HGB 11.9* 12.6*  HCT 36.6* 39.6  MCV 84.1 84.4  PLT 120* 170   Basic Metabolic Panel  Recent Labs  03/04/17 0433 03/05/17 0543  NA 139 138  K 3.3* 3.9  CL 104 104  CO2 29 24  GLUCOSE 123* 136*  BUN 19 14  CREATININE 1.13 1.10  CALCIUM 8.9 9.3  _____________  Dg Chest 2 View  Result Date: 03/05/2017 CLINICAL DATA:  Status post pacemaker placement. EXAM: CHEST  2 VIEW COMPARISON:  03/04/2017 chest radiographs and 02/04/2017 chest CT FINDINGS: A dual lead pacemaker is again seen. There has been interval right atrial lead revision. Prior aortic valve replacement is again noted. The cardiomediastinal silhouette is unchanged. Right-sided pleural thickening is again seen extending into the minor fissure. Mild diffuse chronic interstitial accentuation is unchanged in evaluated in more detail on the prior chest CT. No lobar consolidation, overt alveolar edema, large pleural effusion, or pneumothorax is identified. No acute osseous abnormality is seen. IMPRESSION: 1. Interval right atrial pacemaker lead revision. 2. Chronic lung changes without evidence of acute abnormality. Electronically Signed  By: Logan Bores M.D.   On: 03/05/2017 08:37   Dg Chest 2 View  Result Date: 03/04/2017 CLINICAL DATA:  Pacemaker placement. EXAM: CHEST  2 VIEW COMPARISON:  CT 02/04/2017.  Chest x-ray 08/25/2016 . FINDINGS: Surgical clips right upper chest. Stable mediastinal calcification as noted on prior CT. Cardiac pacer noted with lead tips in the right atrium and right ventricle. Prior CABG and cardiac valve replacement. Heart size stable. Stable interstitial process is noted on prior CT. Stable right sided diffuse pleural thickening. Able bilateral apical pleural thickening. No pneumothorax. No acute bony abnormality. IMPRESSION: Cardiac pacer stable position. Prior CABG. Cardiac valve in stable  position. 2. Stable interstitial process unchanged from prior exams. Stable diffuse right pleural thickening. Represent made to prior recent CT report of 02/04/2017. No acute abnormality identified. Electronically Signed   By: Marcello Moores  Register   On: 03/04/2017 07:15   Ct Chest High Resolution  Result Date: 02/04/2017 CLINICAL DATA:  51 year old male with history of dyspnea for 1 year. Evaluate for pulmonary fibrosis. EXAM: CT CHEST WITHOUT CONTRAST TECHNIQUE: Multidetector CT imaging of the chest was performed following the standard protocol without intravenous contrast. High resolution imaging of the lungs, as well as inspiratory and expiratory imaging, was performed. COMPARISON:  Chest CT 03/10/2016. FINDINGS: Cardiovascular: Heart size is normal. There is no significant pericardial fluid, thickening or pericardial calcification. There is aortic atherosclerosis, as well as atherosclerosis of the great vessels of the mediastinum and the coronary arteries, including calcified atherosclerotic plaque in the left main, left anterior descending, left circumflex and right coronary arteries. Status post median sternotomy for CABG including LIMA to the LAD. 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 Status post aortic valve replacement with a mechanical aortic valve. Mitral annular calcifications. Mediastinum/Nodes: No pathologically enlarged mediastinal or hilar lymph nodes. Please note that accurate exclusion of hilar adenopathy is limited on noncontrast CT scans. Esophagus is unremarkable in appearance. No axillary lymphadenopathy. Lungs/Pleura: Extensive pleural thickening calcification in the right hemithorax, similar to prior study from 2017, with evidence of chronic volume loss throughout the periphery of the right  lung, indicative of a fibrothorax. Trace left pleural effusion. No left pleural calcifications are noted. High-resolution images demonstrate relatively diffuse ground-glass attenuation throughout the lungs bilaterally which appears relatively similar to the prior examination. In the medial aspects of both lungs there are some focal areas of septal thickening and architectural distortion, presumably related to postradiation fibrosis in this patient with history of Hodgkin's disease. No significant traction bronchiectasis or frank honeycombing. Inspiratory and expiratory imaging demonstrates mild to moderate air trapping, indicative of small airways disease. 8 x 7 mm pulmonary nodule in the posterior aspect of the left upper lobe (image 71 of series 5) is unchanged, presumably benign. No other suspicious appearing pulmonary nodules or masses are noted. Upper Abdomen: Multiple tiny partially calcified gallstones are noted within the dependent portion of the gallbladder. No findings to suggest an acute cholecystitis at this time. Musculoskeletal: There are no aggressive appearing lytic or blastic lesions noted in the visualized portions of the skeleton. IMPRESSION: 1. Unusual appearance of the lungs with relatively diffuse ground-glass attenuation. This pattern can be seen in the setting of a smoking related disease such as desquamative interstitial pneumonia (DIP), however, in this non smoking patient with history of prior radiation therapy for Hodgkin's  disease, these findings are favored to reflect chronic pulmonary edema likely related to chronic lymphatic congestion from prior radiation therapy. Overall, the appearance is very similar to remote prior study 03/10/2016. 2. Right-sided fibrothorax again noted. 3. 8 x 7 mm left upper lobe pulmonary nodule is unchanged compared to 2017, presumably benign. 4. Aortic atherosclerosis, in addition to left main and 3 vessel coronary artery disease. Status post median  sternotomy for CABG including LIMA to the LAD, as well as mechanical aortic valve replacement. 5. Cholelithiasis without evidence of acute cholecystitis at this time. Electronically Signed   By: Vinnie Langton M.D.   On: 02/04/2017 16:05   Disposition   Pt is being discharged home today in good condition.  Follow-up Plans & Appointments   Follow-up Information    Larey Dresser, MD Follow up on 03/09/2017.   Specialty:  Cardiology Why:  at 1030 for post hospital follow up. Please bring all medication to your visit. Code for parking is 6001. Contact information: [place] [address] [phone]        Nahser, Wonda Cheng, MD Follow up on 03/15/2017.   Specialty:  Cardiology Why:  10:20 am Contact information: [place] [address] [phone]        [place] [facility] Follow up on 03/09/2017.   Specialty:  Cardiology Why:  7:30 am Contact information: [address], [address] [place]       Evans Lance, MD Follow up.   Specialty:  Cardiology Why:  We will arrange for Device Clinic follow-up for 7-10 days and follow up with Dr. Lovena Le in approximately 3 mos.  We will contact you with appointments. Contact information: [address] [address] [phone]            Discharge Medications   Current Discharge Medication List    CONTINUE these medications which have CHANGED   Details  furosemide (LASIX) 40 MG tablet Take 2 tablets (80 mg total) by mouth 2 (two) times daily. Qty: 120 tablet, Refills: 6    potassium chloride (K-DUR) 10 MEQ tablet Take 2 tablets (20 mEq total) by mouth 2 (two) times daily. Qty: 120 tablet, Refills: 3      CONTINUE these medications which have NOT CHANGED   Details  aspirin 81 MG tablet Take 81 mg by mouth daily.    buPROPion (WELLBUTRIN XL) 150 MG 24 hr tablet Take 150 mg by  mouth daily. Refills: 1    Canagliflozin-Metformin HCl (INVOKAMET) 50-1000 MG TABS Take 1 tablet by mouth 2 (two) times daily before a meal.    Cholecalciferol (VITAMIN D-3) 5000 units TABS Take 5,000 Units by mouth daily.    escitalopram (LEXAPRO) 10 MG tablet Take 20 mg by mouth daily.     gabapentin (NEURONTIN) 300 MG capsule Take 2 capsules (600 mg total) by mouth at bedtime. Qty: 180 capsule, Refills: 3    losartan (COZAAR) 25 MG tablet Take 25 mg by mouth at bedtime.     Multiple Vitamins-Minerals (MENS MULTI VITAMIN & MINERAL) TABS Take 1 tablet by mouth daily.    omeprazole (PRILOSEC) 20 MG capsule Take 20 mg by mouth daily.    rosuvastatin (CRESTOR) 40 MG tablet Take 40 mg by mouth at bedtime.     Testosterone 12.5 MG/ACT (1%) GEL Apply 8 Squirts topically daily.  Refills: 1    nitroGLYCERIN (NITROSTAT) 0.4 MG  SL tablet Place 1 tablet (0.4 mg total) under the tongue every 5 (five) minutes as needed for chest pain. Qty: 25 tablet, Refills: 6    warfarin (COUMADIN) 3 MG tablet Take 1 tablet (3 mg total) by mouth as directed. Take 3 mg by mouth as directed by Coumadin Clinic Qty: 120 tablet, Refills: 1          Outstanding Labs/Studies   INR f/u @ Coumadin clinic as scheduled.  Duration of Discharge Encounter   Greater than 30 minutes including physician time.  Signed, Murray Hodgkins NP 03/05/2017, 10:55 AM    Trude Mcburney

## 2017-03-05 NOTE — Progress Notes (Signed)
[place] for warfarin Indication: mechanical AVR   Assessment: 64 yom with hx of mechanical AVR on warfarin PTA admitted with HB. Pt has been bridged with Lovenox PTA - no further need for bridging per Dr. Aundra Dubin. Pharmacy consulted to dose warfarin while inpatient. S/p PPM implanted per EP on 5/24. INR subtherapeutic but trending up 1.20>1.29. Plan for discharge today per cards.  PTA warfarin dose: 3mg  daily (last dose 5/18 - holding for procedure PTA) Per coumadin clinic record, his INR has been stable and therapeutic at higher end goal on 3mg  daily  Goal of Therapy:  INR 2-3 Monitor platelets by anticoagulation protocol: Yes   Plan:  Warfarin 3mg  PO daily Daily INR Monitor CBC, s/sx bleeding  Arrie Senate, PharmD PGY-1 Pharmacy Resident Pager: [phone] 03/05/2017

## 2017-03-05 NOTE — Care Management Note (Signed)
Case Management Note  Patient Details  Name: Mark Choi MRN: [id number] Date of Birth: 1966-07-04  Subjective/Objective:                 DC to home. No CM needs identified.    Action/Plan:   Expected Discharge Date:  03/05/17               Expected Discharge Plan:  Home/Self Care  In-House Referral:     Discharge planning Services  CM Consult  Post Acute Care Choice:    Choice offered to:     DME Arranged:    DME Agency:     HH Arranged:    HH Agency:     Status of Service:  Completed, signed off  If discussed at [employer] of Stay Meetings, dates discussed:    Additional Comments:  Carles Collet, RN 03/05/2017, 11:42 AM

## 2017-03-05 NOTE — Progress Notes (Signed)
Device interrogation reviewed and normal CXR reveals no ptx, stable leads  DC to home today Discussed with Dr Dortha Schwalbe MD, [facility] 03/05/2017 10:32 AM

## 2017-03-08 ENCOUNTER — Encounter (HOSPITAL_COMMUNITY): Payer: Self-pay | Admitting: Internal Medicine

## 2017-03-09 ENCOUNTER — Encounter (HOSPITAL_COMMUNITY): Payer: Self-pay

## 2017-03-09 ENCOUNTER — Ambulatory Visit (HOSPITAL_COMMUNITY)
Admission: RE | Admit: 2017-03-09 | Discharge: 2017-03-09 | Disposition: A | Discharge status: Home / Self Care | Payer: 59 | Source: Ambulatory Visit | Attending: Cardiology | Admitting: Cardiology

## 2017-03-09 ENCOUNTER — Ambulatory Visit (INDEPENDENT_AMBULATORY_CARE_PROVIDER_SITE_OTHER): Payer: 59 | Admitting: *Deleted

## 2017-03-09 VITALS — BP 107/62 | HR 98 | Wt 169.8 lb

## 2017-03-09 DIAGNOSIS — Z952 Presence of prosthetic heart valve: Secondary | ICD-10-CM | POA: Insufficient documentation

## 2017-03-09 DIAGNOSIS — Z5181 Encounter for therapeutic drug level monitoring: Secondary | ICD-10-CM | POA: Diagnosis not present

## 2017-03-09 DIAGNOSIS — Z951 Presence of aortocoronary bypass graft: Secondary | ICD-10-CM | POA: Diagnosis not present

## 2017-03-09 DIAGNOSIS — Z7982 Long term (current) use of aspirin: Secondary | ICD-10-CM | POA: Insufficient documentation

## 2017-03-09 DIAGNOSIS — E785 Hyperlipidemia, unspecified: Secondary | ICD-10-CM | POA: Diagnosis not present

## 2017-03-09 DIAGNOSIS — Z7901 Long term (current) use of anticoagulants: Secondary | ICD-10-CM | POA: Diagnosis not present

## 2017-03-09 DIAGNOSIS — R0602 Shortness of breath: Secondary | ICD-10-CM | POA: Insufficient documentation

## 2017-03-09 DIAGNOSIS — Z954 Presence of other heart-valve replacement: Secondary | ICD-10-CM

## 2017-03-09 DIAGNOSIS — K219 Gastro-esophageal reflux disease without esophagitis: Secondary | ICD-10-CM | POA: Diagnosis not present

## 2017-03-09 DIAGNOSIS — I447 Left bundle-branch block, unspecified: Secondary | ICD-10-CM | POA: Insufficient documentation

## 2017-03-09 DIAGNOSIS — J8410 Pulmonary fibrosis, unspecified: Secondary | ICD-10-CM | POA: Insufficient documentation

## 2017-03-09 DIAGNOSIS — I2510 Atherosclerotic heart disease of native coronary artery without angina pectoris: Secondary | ICD-10-CM | POA: Insufficient documentation

## 2017-03-09 DIAGNOSIS — Z923 Personal history of irradiation: Secondary | ICD-10-CM | POA: Insufficient documentation

## 2017-03-09 DIAGNOSIS — I5032 Chronic diastolic (congestive) heart failure: Secondary | ICD-10-CM | POA: Diagnosis not present

## 2017-03-09 DIAGNOSIS — I4430 Unspecified atrioventricular block: Secondary | ICD-10-CM | POA: Insufficient documentation

## 2017-03-09 DIAGNOSIS — E119 Type 2 diabetes mellitus without complications: Secondary | ICD-10-CM | POA: Insufficient documentation

## 2017-03-09 DIAGNOSIS — I2583 Coronary atherosclerosis due to lipid rich plaque: Secondary | ICD-10-CM

## 2017-03-09 DIAGNOSIS — I425 Other restrictive cardiomyopathy: Secondary | ICD-10-CM | POA: Diagnosis not present

## 2017-03-09 DIAGNOSIS — Z7984 Long term (current) use of oral hypoglycemic drugs: Secondary | ICD-10-CM | POA: Diagnosis not present

## 2017-03-09 DIAGNOSIS — Z8571 Personal history of Hodgkin lymphoma: Secondary | ICD-10-CM | POA: Diagnosis not present

## 2017-03-09 DIAGNOSIS — F329 Major depressive disorder, single episode, unspecified: Secondary | ICD-10-CM | POA: Diagnosis not present

## 2017-03-09 LAB — BASIC METABOLIC PANEL
ANION GAP: 12 (ref 5–15)
BUN: 16 mg/dL (ref 6–20)
CALCIUM: 9.3 mg/dL (ref 8.9–10.3)
CHLORIDE: 98 mmol/L — AB (ref 101–111)
CO2: 28 mmol/L (ref 22–32)
Creatinine, Ser: 1.04 mg/dL (ref 0.61–1.24)
GFR calc non Af Amer: >60 mL/min (ref >60)
GLUCOSE: 148 mg/dL — AB (ref 65–99)
POTASSIUM: 3.5 mmol/L (ref 3.5–5.1)
Sodium: 138 mmol/L (ref 135–145)

## 2017-03-09 LAB — BRAIN NATRIURETIC PEPTIDE: B Natriuretic Peptide: 74 pg/mL (ref 0.0–100.0)

## 2017-03-09 LAB — POCT INR: INR: 1.1

## 2017-03-09 MED ORDER — FUROSEMIDE 40 MG PO TABS: 80.0000 mg | ORAL_TABLET | Freq: Two times a day (BID) | ORAL | 6 refills | Status: DC

## 2017-03-09 NOTE — Patient Instructions (Signed)
Lasix refilled.  Routine lab work today. Will notify you of abnormal results, otherwise no news is good news!  Follow up 6-8 weeks with Dr. Aundra Dubin.  Do the following things EVERYDAY: 1) Weigh yourself in the morning before breakfast. Write it down and keep it in a log. 2) Take your medicines as prescribed 3) Eat low salt foods-Limit salt (sodium) to 2000 mg per day.  4) Stay as active as you can everyday 5) Limit all fluids for the day to less than 2 liters

## 2017-03-10 ENCOUNTER — Telehealth: Payer: Self-pay | Admitting: Cardiovascular Disease

## 2017-03-10 ENCOUNTER — Encounter (HOSPITAL_COMMUNITY): Payer: Self-pay | Admitting: Clinical

## 2017-03-10 ENCOUNTER — Ambulatory Visit (INDEPENDENT_AMBULATORY_CARE_PROVIDER_SITE_OTHER): Payer: 59 | Admitting: Clinical

## 2017-03-10 DIAGNOSIS — F331 Major depressive disorder, recurrent, moderate: Secondary | ICD-10-CM | POA: Diagnosis not present

## 2017-03-10 DIAGNOSIS — F411 Generalized anxiety disorder: Secondary | ICD-10-CM

## 2017-03-10 NOTE — Progress Notes (Signed)
PCP: Dr. Ernie Hew Cardiology: Dr. Acie Fredrickson HF Cardiology: Dr. Aundra Dubin  51 yo with history of CAD s/p CABG, Hodgkins lymphoma treated with radiation, and mechanical aortic valve was referred by Dr. Acie Fredrickson for evaluation of progressive dyspnea.   Patient had mantle radiation for Hodgkins lymphoma at age 38.  In his 56s, he had CABG (1998).  In 2010, he had mechanical aortic valve replacement.  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    Around 5/17, he began to develop progressive dyspnea.  The dyspnea has slowly worsened since that time.  He also developed chest heaviness.  In 5/17, he had a CT chest showing bronchiectasis.  He had right and left heart cath in 7/17 that showed normal filling pressures and stable CAD (no interventional target).  PFTs in 8/17 showed a moderate restrictive defect. Dyspnea continued to progress.  When I first saw him, he was short of breath after walking 30-40 feet.  He had constant chest heaviness. He had orthopnea and had to prop up to sleep.    There has been concern for constrictive pericarditis versus restrictive cardiomyopathy given prior CABG and chest radiation.  Cath in 7/17 was not suggestive of this with completely normal filling pressures.  CPX in 4/18 actually suggested that the patient has more ventilatory than cardiac limitation, severe restrictive lung physiology.  Echo from 4/18 was reviewed, there was some pericardial thickening towards the apex and there appeared to be respirophasic variation of the interventricular septum beyond the septal bounce that would be expected with LBBB. Mitral E velocity was too low to accurately comment. RHC was repeated in 4/18. Filling pressures were elevated.  Also, there were some features concerning for constrictive pericarditis.  However, unable to cross aortic valve (mechanical) for simultaneous LV/RV measurement.  Cardiac MRI from 4/18 showed possible ventricular interdependence on the free-breathing  sequences.  High resolution CT showed diffuse ground glass in the lungs, possibly from chronic lymphatic congestion post-radiation.  There was no pulmonary fibrosis noted. There did appear to be some pericardial thickening.   Given the need to confirm constrictive pericarditis and rule out primary restrictive cardiomyopathy, I set him up for repeat RHC with trans-septal puncture to enter the LV (mechanical aortic valve).  This was done in 2/84, showing diastolic pressure equalization, dip/plateau pattern in LV and RV pressure tracings, rapid y descent in RA tracing.  There was no clear ventricular interdependence (LV and RV systolic pressures tracked together with respiration).  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    I admitted him after cath for diuresis.  We also noted on arrival for cath that he was in 2:1 AV block with HR in the 40s.  He has a baseline LBBB.  It was decided to place a pacemaker, Medtronic dual chamber PPM was placed with His bundle pacing.  His QRS complex narrowed.   With diuresis and PPM, Mark Choi is feeling better.  Weight is down 21 lbs.  He is still short of breath after walking about 50 feet up a slight incline (his driveway).  Still some orthopnea but better. Slightly lightheaded if he stands too fast.  Still has abdominal distention. Still dyspnea with stairs but can get to the top without stopping.   ECG (personally reviewed): NSR, His bundle pacing with 128 msec QRS   Labs (3/18): hgb 14, TSH normal Labs (4/18): creatinine 0.99 => 1.06, BNP 156 Labs (5/18): K  3.9, creatinine 1.1  PMH: 1. Depression 2. GERD 3. CAD: CABG in 1998 => FH CAD, also possible accelerated atherosclerosis from prior radiation to chest.  - PCI after CABG in 2004 and 2007.  - LHC (7/17): 90% ostial LM, 95% ostial LCx, 90% pLAD, sequential LIMA-D1+mid LAD patent, SVG-OM1 patent with retrograde filling distal  LCx and OM2.  RCA ok.  4. Hodgkin's lymphoma: s/p mantle radiation at age 47.  60. Type II diabetes 6. Mechanical aortic valve replacement in 2010, possible endocarditis in 2014 treated medically (abx).  7. Hyperlipidemia 8. Chronic LBBB 9. Chronic exertional dyspnea: Gradually progressive.  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  - CT chest (5/17): Bronchiectasis - RHC (7/17): mean RA 6, PA 30/11, mean PCWP 9, CI 2.58. - PFTs (8/17): moderate restriction on PFTs.  - Echo (4/18): EF 55-60%, mild LVH, grade II diastolic dysfunction, septal paradox (LBBB), mechanical aortic valve, moderately decreased RV systolic function, PASP 43 mmHg, dilated IVC.  - CPX (4/18): peak VO2 14.9 (43% predicted), VE/VCO2 55, RER 0.97 => submaximal.  However, appears to have severe restrictive lung physiology, he seems to primarily have a ventilatory limitation.  He also was noted to have chronotropic incompetence.   - Cardiac MRI (4/18): EF 64%, no LGE, normal RV size and systolic function, mechanical aortic with mild AI.  I had free breathing images added on, these were concerning for ventricular interdependence with leftwards septal shift with inspiration.  - RHC (4/18, unable to cross aortic valve due to mechanical AVR): mean RA 15 with prominent y descent noted at times but variable, RV 53/19 with dip and plateau pattern present at times but variable, PA 50/25 mean 33, mean PCWP 27 with prominent v-waves, CI 1.8.  - Echo (4/18): EF 60-65%, septal bounce from LBBB but also some respirophasic variation in septal position was noted.  RV normal.  Mitral valve E wave poorly visualized so hard to comment on respirometry across the mitral valve. Pericardial thickening near apex noted. - High resolution CT chest (4/18): diffuse ground glass infiltrates in lungs.  ?chronic lymphatic congestion from radiation.  This does not look like pulmonary fibrosis.  He has a right-sided fibrothorax.   Finally, thickened pericardium (without calcification) was noted.   - RHC with trans-septal puncture (5/18): mean RA 15, RV 48/15, PA 40/6, mean PCWP 25, CI 2.42, LV 113/22, mean LA 21.  There was diastolic pressure equalization, dip/plateau pattern in LV and RV pressure tracings, rapid y descent in RA tracing.  There was no clear ventricular interdependence (LV and RV systolic pressures tracked together with respiration).  10. 2:1 AV block with bradycardia and LBBB: Medtronic dual chamber PPM placed 5/18 with His bundle pacing.   FH: Male side with MIs, usually in their early 85s.   Social History   Social History  . Marital status: Married    Spouse name: N/A  . Number of children: 1  . Years of education: College   Occupational History  . Works at home    Social History Main Topics  . Smoking status: Never Smoker  . Smokeless tobacco: Never Used  . Alcohol use 1.2 oz/week    2 Cans of beer per week     Comment: 4 drinks per week  . Drug use: No  . Sexual activity: Not Currently    Partners: Female     Comment: Married   Other Topics Concern  . Not on file   Social History Narrative   **  Merged History Encounter **   Lives at home w/ his wife and son   Right-handed   Drinks about 4 cups of coffee per day       ROS: All systems reviewed and negative except as per HPI.  Current Outpatient Prescriptions  Medication Sig Dispense Refill  . aspirin 81 MG tablet Take 81 mg by mouth daily.    Marland Kitchen buPROPion (WELLBUTRIN XL) 150 MG 24 hr tablet Take 150 mg by mouth daily.  1  . Canagliflozin-Metformin HCl (INVOKAMET) 50-1000 MG TABS Take 1 tablet by mouth 2 (two) times daily before a meal.    . Cholecalciferol (VITAMIN D-3) 5000 units TABS Take 5,000 Units by mouth daily.    Marland Kitchen escitalopram (LEXAPRO) 10 MG tablet Take 20 mg by mouth daily.     . furosemide (LASIX) 40 MG tablet Take 2 tablets (80 mg total) by mouth 2 (two) times daily. 120 tablet 6  . gabapentin (NEURONTIN) 300 MG  capsule Take 2 capsules (600 mg total) by mouth at bedtime. 180 capsule 3  . losartan (COZAAR) 25 MG tablet Take 25 mg by mouth at bedtime.     . Multiple Vitamins-Minerals (MENS MULTI VITAMIN & MINERAL) TABS Take 1 tablet by mouth daily.    . nitroGLYCERIN (NITROSTAT) 0.4 MG SL tablet Place 1 tablet (0.4 mg total) under the tongue every 5 (five) minutes as needed for chest pain. 25 tablet 6  . omeprazole (PRILOSEC) 20 MG capsule Take 20 mg by mouth daily.    . potassium chloride (K-DUR) 10 MEQ tablet Take 2 tablets (20 mEq total) by mouth 2 (two) times daily. 120 tablet 3  . rosuvastatin (CRESTOR) 40 MG tablet Take 40 mg by mouth at bedtime.     Marland Kitchen warfarin (COUMADIN) 3 MG tablet Take 1 tablet (3 mg total) by mouth as directed. Take 3 mg by mouth as directed by [facility]mad[facility] (Patient taking differently: Take 3 mg by mouth daily at 6 PM. ) 120 tablet 1  . Testosterone 12.5 MG/ACT (1%) GEL Apply 8 Squirts topically daily.   1   No current facility-administered medications for this encounter.    BP 107/62   Pulse 98   Wt 169 lb 12.8 oz (77 kg)   SpO2 97%   BMI 24.02 kg/m  General: NAD Neck: JVP 7-8 cm, no thyromegaly or thyroid nodule.  Lungs: Clear to auscultation bilaterally with normal respiratory effort. CV: Nondisplaced PMI.  Heart regular mechanical S2, no S3/S4, 2/6 early SEM RUSB. No edema.  No carotid bruit.  Normal pedal pulses.  Abdomen: Soft, nontender, no hepatosplenomegaly, moderate distention.  Skin: Intact without lesions or rashes.  Neurologic: Alert and oriented x 3.  Psych: Normal affect. Extremities: No clubbing or cyanosis.  HEENT: Normal.   Assessment/Plan: 1. CAD: s/p CABG.  Cath in 7/17 with stable coronaries and grafts.  Patient had already started to develop dyspnea at this time, so doubt progressive CAD as cause of symptoms.  - Continue ASA 81, statin.  2. Mechanical aortic valve: Normally-functioning on last echo in 4/18.   - Continue ASA 81 + warfarin.   3. Chronic diastolic CHF: Suspect restrictive cardiomypoathy.  Echo in 4/18 with normal EF.  Elevated filling pressures on 5/18 RHC.  He is symptomatically improved though still NYHA class III.  Volume looks better, weight down 21 lbs.  - Continue Lasix 80 mg po bid, BMET today.  4. Exertional dyspnea:  This has been progressive since 5/17.  As above, do  not think this is related to progressive coronary disease (angiography after symptoms started showed stable disease with patent grafts).  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  CPX was suggestive of a lung problem => restrictive physiology suggests possible pulmonary fibrosis.  However, high resolution CT chest in 4/18 showed ground glass infiltrates that may be due to chronic lymphatic congestion but not pulmonary fibrosis.  Echo and cardiac MRI free breathing sequences were both concerning for some, but not marked, ventricular interdependence with septal shift noted with inspiration.  There does appear to be some pericardial thickening on echo (towards apex) and on high resolution chest CT.  RHC was done, showing elevated filling pressures and some features that could be consistent with pericardial constriction (dip and plateau RV filling pattern and prominent y descent on RA tracing).  However, similar findings could be seen with restrictive cardiomyopathy.  Unfortunately, given mechanical aortic valve, I was unable to cross into the left ventricle to get simultaneous LV and RV tracings.  Therefore, in 5/18, I had Dr. Burt Knack repeat the cath with trans-septal puncture to enter the LV for simultaneous LV/RV tracings.  Findings on this cath were most consistent with a restrictive cardiomyopathy => diastolic pressure equalization, dip/plateau pattern in the RV/LV pressure tracings, rapid y descent  RA tracing, but NO clear ventricular interdependence with respiration (LV and RV systolic pressure appeared to track together).  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  There may be a degree of constrictive pericarditis as well, but I do not think this is the major issue and I do not think that pericardial stripping would give him enough symptomatic improvement to be worth the risk. - Given my concern for restrictive cardiomyopathy and his severe symptoms, I am going to refer him to the transplant clinic at [facility].  If this is indeed a restrictive cardiomyopathy, working towards transplant may be our only good long-term option. In the meantime, I will continue to diurese him as tolerated to keep his filling pressures down.  5. Bradycardia: 2:1 AV block with LBBB.  Now s/p Medtronic dual chamber PPM with His bundle pacing.  His QRS has narrowed. He will have followup with EP.   Followup in 6 wks.   Loralie Champagne 03/10/2017

## 2017-03-10 NOTE — Telephone Encounter (Signed)
°  Follow Up   Pt returning your call from earlier. Please call.

## 2017-03-10 NOTE — Telephone Encounter (Signed)
Spoke with patient and advised him that per Dr. Acie Fredrickson, he does not need to come in for follow-up on Tuesday June 5.  I advised that Dr. Acie Fredrickson is following the plan from CHF clinic to recommend patient for transplant and there is not anything different that he would do at this time. I advised him that he will need to keep appointment for device wound check and coumadin clinic. He thanked me for the call.

## 2017-03-10 NOTE — Progress Notes (Signed)
   THERAPIST PROGRESS NOTE  Session Time: 8:05 - 9:00   Participation Level: Active  Behavioral Response: CasualAlertNA  Type of Therapy: Individual Therapy  Treatment Goals addressed: improve psychiatric symptoms, elevate mood, decrease irrational worries and fears, learn about diagnosis, healthy coping skills  Interventions: Motivational Interviewing, mindfulness techniques  Summary: Mark Choi is a 51 y.o. male who presents with Major Depressive Disorder, Recurrent, Moderate and Generalized Anxiety Disorder  Suicidal/Homicidal: No without intent/plan  Therapist Response: Mark Choi met with clinician for an individual therapy session. He discussed his psychiatric symptoms, his current life events and his homework. He shared that he was doing surprisingly well. He shared that he had a big health scare, He shared that he had to have an emergency surgery to put a pace maker in. Clinician asked open ended questions and [name] shared that he thinks he is doing better mentally because he is focusing on taking care of things for the up coming move. He shared he has to slow down and recoup but that the move keeps his mind busy. He shared that he did not complete his homework. He shared he was practicing some techniques learned in therapy and was working to challenge his negative thoughts. Clinician encouraged him to complete his homework as there are things in it that might help him in the future. Client and clinician discussed depression and anxiety. Client and clinician discussed some mindfulness techniques to help him stay present. Client and clinician discussed how to practice these techniques.  Plan: Return in 2-3 weeks.  Diagnosis: Axis I: Major Depressive Disorder, Recurrent, Moderate and Generalized Anxiety Disorder      Isaly Fasching A, LCSW 03/10/2017

## 2017-03-11 ENCOUNTER — Telehealth (HOSPITAL_COMMUNITY): Payer: Self-pay

## 2017-03-11 NOTE — Telephone Encounter (Signed)
Patient made aware of completed paperwork left for Dr. Aundra Dubin.  Patient requesting to have forms mailed to him at his confirmed address.   Formed mailed, copy placed in patient's electronic medical records.  Renee Pain, RN

## 2017-03-15 ENCOUNTER — Ambulatory Visit (INDEPENDENT_AMBULATORY_CARE_PROVIDER_SITE_OTHER): Payer: 59

## 2017-03-15 ENCOUNTER — Ambulatory Visit (INDEPENDENT_AMBULATORY_CARE_PROVIDER_SITE_OTHER): Payer: 59 | Admitting: *Deleted

## 2017-03-15 ENCOUNTER — Ambulatory Visit: Payer: 59 | Admitting: Cardiovascular Disease

## 2017-03-15 ENCOUNTER — Encounter: Payer: Self-pay | Admitting: Internal Medicine

## 2017-03-15 DIAGNOSIS — I441 Atrioventricular block, second degree: Secondary | ICD-10-CM | POA: Diagnosis not present

## 2017-03-15 DIAGNOSIS — Z5181 Encounter for therapeutic drug level monitoring: Secondary | ICD-10-CM | POA: Diagnosis not present

## 2017-03-15 DIAGNOSIS — Z954 Presence of other heart-valve replacement: Secondary | ICD-10-CM

## 2017-03-15 DIAGNOSIS — Z952 Presence of prosthetic heart valve: Secondary | ICD-10-CM

## 2017-03-15 LAB — CUP PACEART INCLINIC DEVICE CHECK
Battery Remaining Longevity: 138 mo
Brady Statistic AP VP Percent: 0 %
Brady Statistic AP VS Percent: 0 %
Brady Statistic RV Percent Paced: 99.79 %
Date Time Interrogation Session: 20180605140656
Implantable Lead Implant Date: 20180524
Implantable Lead Location: 753859
Implantable Lead Location: 753860
Implantable Lead Model: 3830
Lead Channel Impedance Value: 494 Ohm
Lead Channel Impedance Value: 684 Ohm
Lead Channel Pacing Threshold Pulse Width: 0.4 ms
Lead Channel Sensing Intrinsic Amplitude: 31.625 mV
Lead Channel Sensing Intrinsic Amplitude: 4.625 mV
Lead Channel Setting Pacing Amplitude: 3.5 V
Lead Channel Setting Pacing Amplitude: 3.5 V
Lead Channel Setting Pacing Pulse Width: 1 ms
Lead Channel Setting Sensing Sensitivity: 2 mV
MDC IDC LEAD IMPLANT DT: 20180524
MDC IDC MSMT BATTERY VOLTAGE: 3.17 V
MDC IDC MSMT LEADCHNL RA IMPEDANCE VALUE: 323 Ohm
MDC IDC MSMT LEADCHNL RA IMPEDANCE VALUE: 456 Ohm
MDC IDC MSMT LEADCHNL RA PACING THRESHOLD AMPLITUDE: 0.75 V
MDC IDC MSMT LEADCHNL RA SENSING INTR AMPL: 4.375 mV
MDC IDC MSMT LEADCHNL RV PACING THRESHOLD AMPLITUDE: 0.375 V
MDC IDC MSMT LEADCHNL RV PACING THRESHOLD PULSEWIDTH: 0.4 ms
MDC IDC PG IMPLANT DT: 20180524
MDC IDC STAT BRADY AS VP PERCENT: 99.78 %
MDC IDC STAT BRADY AS VS PERCENT: 0.21 %
MDC IDC STAT BRADY RA PERCENT PACED: 0 %

## 2017-03-15 LAB — POCT INR: INR: 1.5

## 2017-03-15 NOTE — Patient Instructions (Signed)
Wound check appointment. Steri-strips removed. Wound without redness or edema. Incision edges approximated, wound well healed. Normal device function. Thresholds, sensing, and impedances consistent with implant measurements. Selective capture of HIS lead seen until loss of capture. EKG reviewed with WC - no changes made. Device programmed at 3.5V until 3 month visit. Histogram distribution appropriate for patient and level of activity. No mode switches or high ventricular rates noted. Patient educated about wound care, arm mobility, lifting restrictions. ROV with AS 7/11 and GT 8/28

## 2017-03-22 ENCOUNTER — Ambulatory Visit (INDEPENDENT_AMBULATORY_CARE_PROVIDER_SITE_OTHER): Payer: 59 | Admitting: *Deleted

## 2017-03-22 DIAGNOSIS — Z954 Presence of other heart-valve replacement: Secondary | ICD-10-CM | POA: Diagnosis not present

## 2017-03-22 DIAGNOSIS — Z5181 Encounter for therapeutic drug level monitoring: Secondary | ICD-10-CM | POA: Diagnosis not present

## 2017-03-22 DIAGNOSIS — Z952 Presence of prosthetic heart valve: Secondary | ICD-10-CM | POA: Diagnosis not present

## 2017-03-22 LAB — POCT INR: INR: 1.3

## 2017-03-31 ENCOUNTER — Ambulatory Visit (HOSPITAL_COMMUNITY): Payer: 59 | Admitting: Clinical

## 2017-04-01 ENCOUNTER — Ambulatory Visit (INDEPENDENT_AMBULATORY_CARE_PROVIDER_SITE_OTHER): Payer: 59

## 2017-04-01 DIAGNOSIS — Z5181 Encounter for therapeutic drug level monitoring: Secondary | ICD-10-CM | POA: Diagnosis not present

## 2017-04-01 DIAGNOSIS — Z954 Presence of other heart-valve replacement: Secondary | ICD-10-CM

## 2017-04-01 DIAGNOSIS — Z952 Presence of prosthetic heart valve: Secondary | ICD-10-CM

## 2017-04-01 LAB — POCT INR: INR: 1.9

## 2017-04-01 IMAGING — CR DG CHEST 2V
2 series · 2 of 2 positions shown · non-contrast
Comparison: CT scan of the chest and chest x-ray dated March 10, 2016

CLINICAL DATA: Recent episode of pneumonia, not much improvement
clinically

EXAM:
CHEST  2 VIEW

[w chest pa]
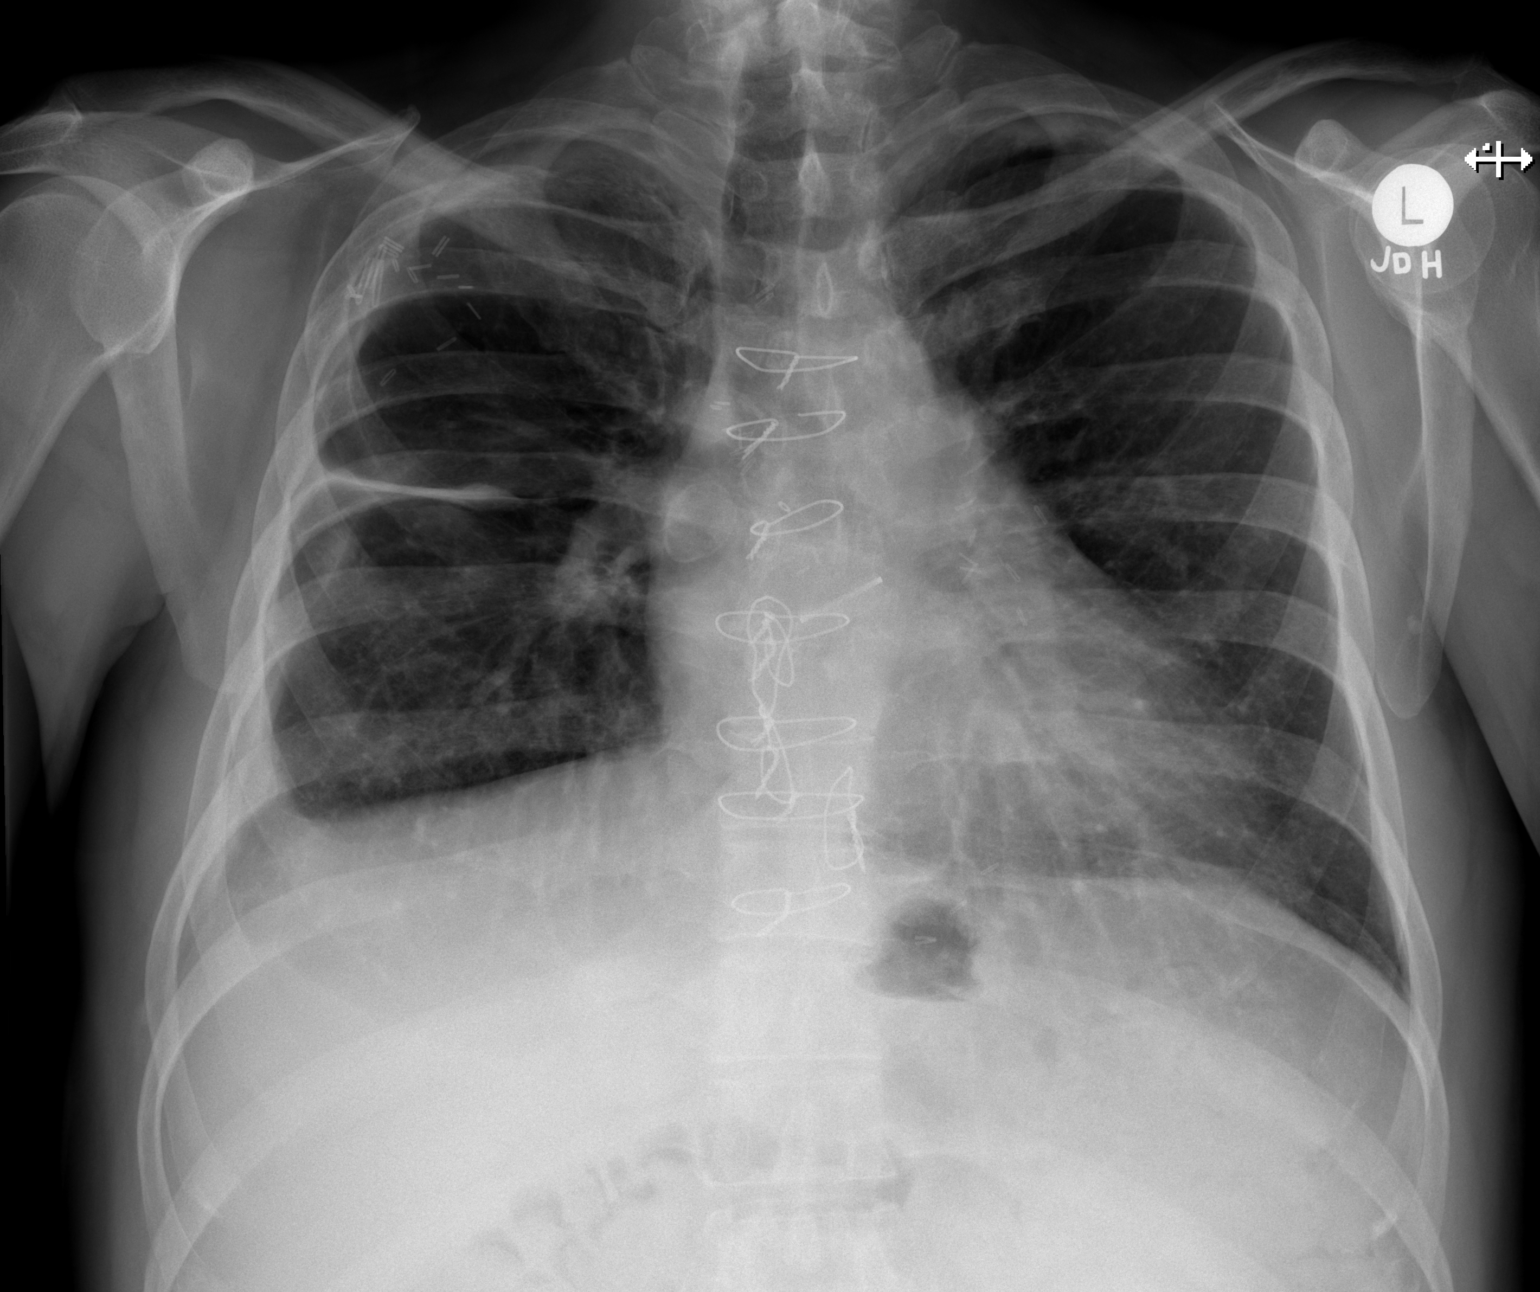

[w chest lat]
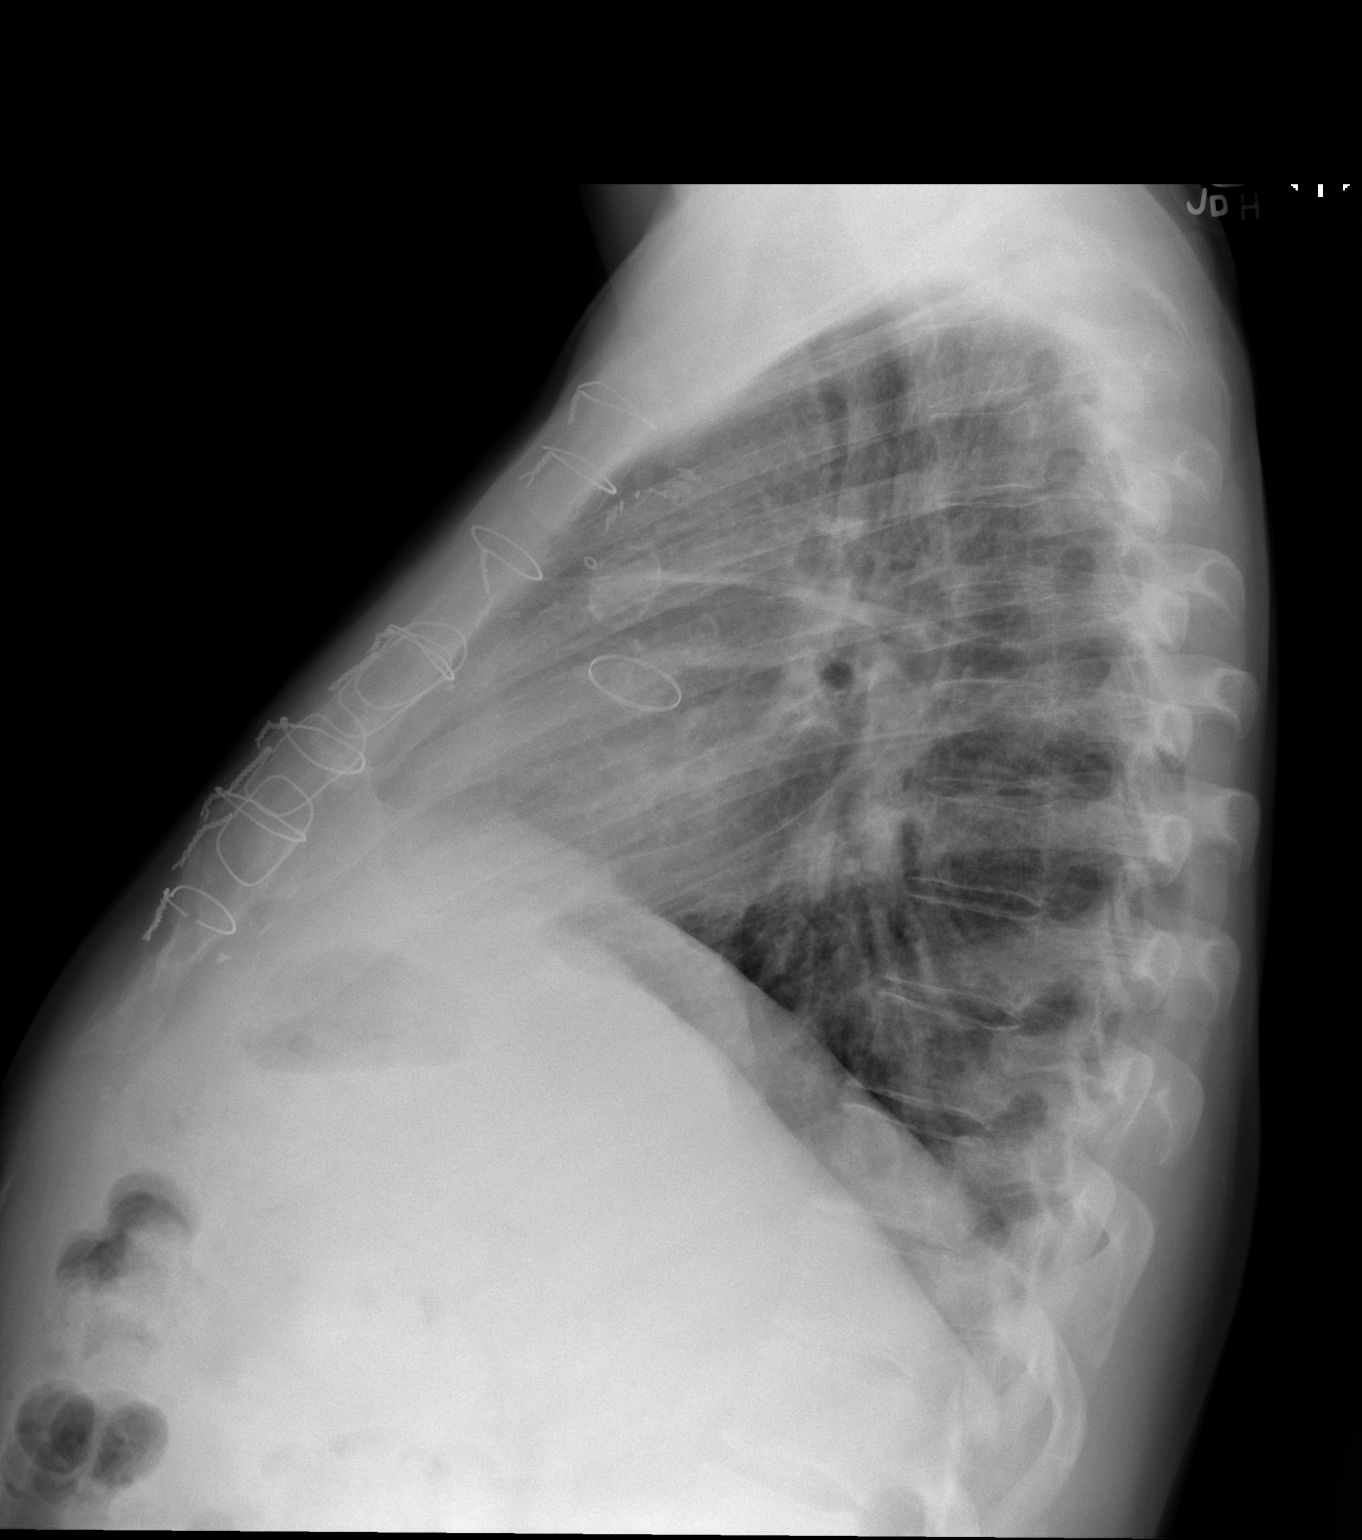

[2 of 2 positions shown; findings below may reference images not displayed]

FINDINGS: There is persistent volume loss on the right. There is a small right
pleural effusion with thickening of the minor fissure which is
stable. Numerous surgical clips are present in the right upper lobe.
No alveolar infiltrate is observed. The left lung is adequately
inflated and clear. The heart is normal in size. There are post
median sternotomy and aortic valve replacement changes. There is a
rounded calcification that projects in the anterior right hilar
region which is stable and most compatible with rim calcification
within a lymph node based on the CT appearance.
IMPRESSION: No significant change in the appearance of the chest since the
previous study. Persistent pleural thickening and/or pleural
effusion on the right is demonstrated. No discrete alveolar
pneumonia and no pulmonary edema.

## 2017-04-06 ENCOUNTER — Telehealth (HOSPITAL_COMMUNITY): Payer: Self-pay | Admitting: *Deleted

## 2017-04-06 NOTE — Telephone Encounter (Signed)
Called pt to get him scheduled for a CPX per Dr.McLean.  Patient did not answer left VM for pt to call back to schedule appt.

## 2017-04-07 ENCOUNTER — Telehealth (HOSPITAL_COMMUNITY): Payer: Self-pay | Admitting: *Deleted

## 2017-04-07 NOTE — Telephone Encounter (Signed)
CPX instructions mailed to patients home.

## 2017-04-15 ENCOUNTER — Ambulatory Visit (INDEPENDENT_AMBULATORY_CARE_PROVIDER_SITE_OTHER): Payer: 59 | Admitting: *Deleted

## 2017-04-15 DIAGNOSIS — Z954 Presence of other heart-valve replacement: Secondary | ICD-10-CM

## 2017-04-15 DIAGNOSIS — Z5181 Encounter for therapeutic drug level monitoring: Secondary | ICD-10-CM

## 2017-04-15 DIAGNOSIS — Z952 Presence of prosthetic heart valve: Secondary | ICD-10-CM

## 2017-04-15 LAB — POCT INR: INR: 2.6

## 2017-04-18 ENCOUNTER — Other Ambulatory Visit (HOSPITAL_COMMUNITY): Payer: Self-pay | Admitting: *Deleted

## 2017-04-18 ENCOUNTER — Ambulatory Visit (HOSPITAL_COMMUNITY): Payer: 59 | Attending: Cardiology

## 2017-04-18 DIAGNOSIS — R0600 Dyspnea, unspecified: Secondary | ICD-10-CM | POA: Insufficient documentation

## 2017-04-18 DIAGNOSIS — I5022 Chronic systolic (congestive) heart failure: Secondary | ICD-10-CM

## 2017-04-19 ENCOUNTER — Ambulatory Visit (INDEPENDENT_AMBULATORY_CARE_PROVIDER_SITE_OTHER): Payer: 59 | Admitting: Clinical

## 2017-04-19 ENCOUNTER — Encounter (HOSPITAL_COMMUNITY): Payer: Self-pay | Admitting: Clinical

## 2017-04-19 DIAGNOSIS — F411 Generalized anxiety disorder: Secondary | ICD-10-CM

## 2017-04-19 DIAGNOSIS — F331 Major depressive disorder, recurrent, moderate: Secondary | ICD-10-CM

## 2017-04-19 NOTE — Progress Notes (Signed)
Electrophysiology Office Note Date: 04/20/2017  ID:  Mark Choi, DOB 1966/07/12, MRN [id number]  PCP: Fanny Bien, MD Primary Cardiologist: [place] Heart Failure: Aundra Dubin Electrophysiologist: Lovena Le  CC: 6 week His Bundle pacemaker follow up  Mark Choi is a 51 y.o. male seen today for Dr Lovena Le.  He presents today for routine electrophysiology followup.  Since last being seen in our clinic, the patient reports doing very well.  He denies chest pain, palpitations, dyspnea, PND, orthopnea, nausea, vomiting, dizziness, syncope, edema, weight gain, or early satiety.  He is feeling much improved post device implant. He is moving to the [place] next month for his wife's job.   Device History: MDT dual chamber His Bundle PPM implanted 2018 for 2:1 AV block   Past Medical History:  Diagnosis Date  . Allergic rhinitis   . 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 Coronary artery disease   . Depression   . Diabetes mellitus without complication ([place])   . GERD (gastroesophageal reflux disease)   . Heart block   . Hodgkin's disease ([place])    AGE 25 IN REMISSION  . HX: long term anticoagulant use   . Hyperlipidemia   . Impacted cerumen   . Major depression   . MDD (major depressive disorder)    STABLE  . Migraine   . Presence of permanent cardiac pacemaker 02/2017  . 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 Tachycardia   . Thrombophilia ([place])   . Vitamin D deficiency    Past Surgical History:  Procedure Laterality Date  . ANGIOPLASTY     5  . ARTIFICIAL AORTIC VALVE     [facility]  . CARDIAC CATHETERIZATION N/A 04/26/2016   Procedure: Right/Left Heart Cath and Coronary/Graft Angiography;  Surgeon: Sherren Mocha, MD;  Location: [facility];  Service: Cardiovascular;  Laterality: N/A;  . CARDIAC SURGERY    . CORONARY ARTERY BYPASS GRAFT    . HEART STENT  2004, 2007   3 STENTS  . LEAD REVISION/REPAIR N/A 03/04/2017   Procedure: Lead  Revision/Repair;  Surgeon: Evans Lance, MD;  Location: [facility];  Service: Cardiovascular;  Laterality: N/A;  . LYMPH NODE BIOPSY  1984  . NASAL SEPTUM SURGERY    . PACEMAKER IMPLANT N/A 03/03/2017   Procedure: Pacemaker Implant;  Surgeon: Evans Lance, MD;  Location: [facility];  Service: Cardiovascular;  Laterality: N/A;  . RIGHT HEART CATH N/A 02/02/2017   Procedure: Right Heart Cath;  Surgeon: Larey Dresser, MD;  Location: [facility];  Service: Cardiovascular;  Laterality: N/A;  . RIGHT HEART CATH N/A 03/03/2017   Procedure: Right Heart Cath;  Surgeon: Sherren Mocha, MD;  Location: [facility];  Service: Cardiovascular;  Laterality: N/A;  . SHUNT REPLACEMENT  1985   Subcutaneous shunt placed and then removed  . THORACOTOMY  2011    Current Outpatient Prescriptions  Medication Sig Dispense Refill  . aspirin 81 MG tablet Take 81 mg by mouth daily.    Marland Kitchen buPROPion (WELLBUTRIN XL) 150 MG 24 hr tablet Take 150 mg by mouth daily.  1  . Canagliflozin-Metformin HCl (INVOKAMET) 50-1000 MG TABS Take 1 tablet by mouth 2 (two) times daily before a meal.    . Cholecalciferol (VITAMIN D-3) 5000 units TABS Take 5,000 Units by mouth daily.    Marland Kitchen escitalopram (LEXAPRO) 10 MG tablet Take 10 mg by mouth daily.     . furosemide (LASIX)  40 MG tablet Take 2 tablets (80 mg total) by mouth 2 (two) times daily. 120 tablet 6  . gabapentin (NEURONTIN) 300 MG capsule Take 2 capsules (600 mg total) by mouth at bedtime. 180 capsule 3  . KLOR-CON 10 10 MEQ tablet Take 10 mEq by mouth 2 (two) times daily.  3  . losartan (COZAAR) 25 MG tablet Take 25 mg by mouth at bedtime.     . Multiple Vitamins-Minerals (MENS MULTI VITAMIN & MINERAL) TABS Take 1 tablet by mouth daily.    . nitroGLYCERIN (NITROSTAT) 0.4 MG SL tablet Place 1 tablet (0.4 mg total) under the tongue every 5 (five) minutes as needed for chest pain. 25 tablet 6  . omeprazole (PRILOSEC) 20 MG capsule Take 20 mg by  mouth daily.    . rosuvastatin (CRESTOR) 40 MG tablet Take 40 mg by mouth at bedtime.     Marland Kitchen warfarin (COUMADIN) 3 MG tablet Take 1 tablet (3 mg total) by mouth as directed. Take 3 mg by mouth as directed by [facility] Clinic (Patient taking differently: Take 3 mg by mouth daily at 6 PM. ) 120 tablet 1   No current facility-administered medications for this visit.     Allergies:   Patient has no known allergies.   Social History: Social History   Social History  . Marital status: Married    Spouse name: N/A  . Number of children: 1  . Years of education: College   Occupational History  . Works at home    Social History Main Topics  . Smoking status: Never Smoker  . Smokeless tobacco: Never Used  . Alcohol use 1.2 oz/week    2 Cans of beer per week     Comment: 4 drinks per week  . Drug use: No  . Sexual activity: Not Currently    Partners: Female     Comment: Married   Other Topics Concern  . Not on file   Social History Narrative   ** Merged History Encounter **   Lives at home w/ his wife and son   Right-handed   Drinks about 4 cups of coffee per day        Family History: Family History  Problem Relation Age of Onset  . Breast cancer Mother   . Alcohol abuse Mother   . Diabetes type II Father   . Diverticulitis Father   . Alcoholism Brother   . Depression Brother   . Cancer Maternal Grandmother   . Cancer Maternal Grandfather   . Alcohol abuse Paternal Grandfather   . Heart disease Paternal Grandfather      Review of Systems: All other systems reviewed and are otherwise negative except as noted above.   Physical Exam: VS:  BP 96/68   Pulse 95   Ht 5' 10.5" (1.791 m)   Wt 186 lb 12.8 oz (84.7 kg)   SpO2 96%   BMI 26.42 kg/m  , BMI Body mass index is 26.42 kg/m.  GEN- The patient is well appearing, alert and oriented x 3 today.   HEENT: normocephalic, atraumatic; sclera clear, conjunctiva pink; hearing intact; oropharynx clear; neck supple    Lungs- Clear to ausculation bilaterally, normal work of breathing.  No wheezes, rales, rhonchi Heart- Regular rate and rhythm (paced) GI- soft, non-tender, non-distended, bowel sounds present  Extremities- no clubbing, cyanosis, or edema  MS- no significant deformity or atrophy Skin- warm and dry, no rash or lesion; PPM pocket well healed Psych- euthymic mood, full affect  Neuro- strength and sensation are intact  PPM Interrogation- reviewed in detail today,  See PACEART report  EKG:  EKG is ordered today. The ekg ordered today shows sinus rhythm with V pacing  Recent Labs: 03/05/2017: Hemoglobin 12.6; Platelets 155 03/09/2017: B Natriuretic Peptide 74.0; BUN 16; Creatinine, Ser 1.04; Potassium 3.5; Sodium 138   Wt Readings from Last 3 Encounters:  04/20/17 186 lb 12.8 oz (84.7 kg)  03/09/17 169 lb 12.8 oz (77 kg)  03/05/17 186 lb 8.2 oz (84.6 kg)     Other studies Reviewed: Additional studies/ records that were reviewed today include: Dr Claris Gladden office notes, [facility] notes, hospital records  Assessment and Plan:  1.  2:1 AV block Normal PPM function See Claudia Desanctis Art report No changes today His Bundle capture non-selective but with narrow QRS down to loss of capture at 0.75V He is moving to the [place] with his wife next month.  Patient enrolled in [facility] today  2.  Restrictive cardiomyopathy/chronic systolic heart failure  Followed closely by AHF clinic Seen at [facility] for transplant evaluation, deferred with improvement in functional status after pacing  3.  CAD s/p CABG No recent ischemic symptoms Continue current therapy  4.  Mechanical aortic valve No bleeding issues with Warfarin    Current medicines are reviewed at length with the patient today.   The patient does not have concerns regarding his medicines.  The following changes were made today:  none  Labs/ tests ordered today include: none No orders of the defined types were placed in this  encounter.    Disposition:   Follow up with [facility], Dr Lovena Le 6 weeks as scheduled (if still in country)    Signed, Chanetta Marshall, NP 04/20/2017 2:31 PM  [facility] [address] [place] [address] [phone] (office) [phone] (fax)

## 2017-04-19 NOTE — Progress Notes (Signed)
  THERAPIST PROGRESS NOTE  Session Time: 12:20 - 1:00   Participation Level: Active  Behavioral Response: CasualAlertNA Type of Therapy: Individual Therapy  Treatment Goals addressed: improve psychiatric symptoms,   healthy coping skills  Interventions: Motivational Interviewing  Summary: Zaim Nitta IV is a 51 y.o. male who presents with Major Depressive Disorder, Recurrent, Moderate and Generalized Anxiety Disorder  Suicidal/Homicidal: No without intent/plan  Therapist Response: Marcello Moores met with clinician for an individual therapy session. He discussed his psychiatric symptoms and  his current life events. Clarice shared that he has been experiencing increased health issues. Clinician asked open ended questions and Takao shared that he has been keeping busy to avoid thinking about the possibility of dying. Clinician asked open ended questions and Abdulahi avoided discussing his fears. Clinician asked about his upcoming move. [place] shared his thoughts and emotions about moving and his limited ability to help in certain ways due to his health. Clinician asked open ended questions and Matheson identified things he can do that help him feel like he is contributing. Marcello Moores and clinician discussed healthy coping skills. This is client and clinicians last session together. Client and clinician bid farewell  Plan: Return in 2-3 weeks.  Diagnosis: Axis I: Major Depressive Disorder, Recurrent, Moderate and Generalized Anxiety Disorder     Iris Hairston A, LCSW 04/19/2017

## 2017-04-20 ENCOUNTER — Encounter: Payer: Self-pay | Admitting: *Deleted

## 2017-04-20 ENCOUNTER — Ambulatory Visit (INDEPENDENT_AMBULATORY_CARE_PROVIDER_SITE_OTHER): Payer: 59 | Admitting: Nurse Practitioner

## 2017-04-20 ENCOUNTER — Encounter: Payer: Self-pay | Admitting: Nurse Practitioner

## 2017-04-20 VITALS — BP 96/68 | HR 95 | Ht 70.5 in | Wt 186.8 lb

## 2017-04-20 DIAGNOSIS — I441 Atrioventricular block, second degree: Secondary | ICD-10-CM | POA: Diagnosis not present

## 2017-04-20 DIAGNOSIS — Z954 Presence of other heart-valve replacement: Secondary | ICD-10-CM

## 2017-04-20 DIAGNOSIS — I2510 Atherosclerotic heart disease of native coronary artery without angina pectoris: Secondary | ICD-10-CM

## 2017-04-20 DIAGNOSIS — I425 Other restrictive cardiomyopathy: Secondary | ICD-10-CM | POA: Diagnosis not present

## 2017-04-20 DIAGNOSIS — I2583 Coronary atherosclerosis due to lipid rich plaque: Secondary | ICD-10-CM

## 2017-04-20 DIAGNOSIS — Z006 Encounter for examination for normal comparison and control in clinical research program: Secondary | ICD-10-CM

## 2017-04-20 NOTE — Progress Notes (Signed)
Mr. Luckadoo met criteria for the [facility] evaluation. The project was discussed with the patient including the risk and benefits. Mr. Aldous was given the opportunity to read the consent and ask questions. Mr. Steffenhagen signed the consent. After the consent was signed we proceeded with the evaluation.

## 2017-04-20 NOTE — Patient Instructions (Signed)
Medication Instructions:  Your physician recommends that you continue on your current medications as directed. Please refer to the Current Medication list given to you today.   Labwork: None Ordered   Testing/Procedures: None Ordered   Follow-Up: Your physician recommends that you schedule a follow-up appointment in: as scheduled  Any Other Special Instructions Will Be Listed Below (If Applicable).     If you need a refill on your cardiac medications before your next appointment, please call your pharmacy.

## 2017-04-25 ENCOUNTER — Encounter (HOSPITAL_COMMUNITY): Payer: Self-pay | Admitting: Cardiology

## 2017-04-25 ENCOUNTER — Ambulatory Visit (HOSPITAL_COMMUNITY)
Admission: RE | Admit: 2017-04-25 | Discharge: 2017-04-25 | Disposition: A | Discharge status: Home / Self Care | Payer: 59 | Source: Ambulatory Visit | Attending: Cardiology | Admitting: Cardiology

## 2017-04-25 VITALS — BP 112/77 | HR 83 | Wt 188.5 lb

## 2017-04-25 DIAGNOSIS — I5032 Chronic diastolic (congestive) heart failure: Secondary | ICD-10-CM | POA: Insufficient documentation

## 2017-04-25 DIAGNOSIS — Z7901 Long term (current) use of anticoagulants: Secondary | ICD-10-CM | POA: Insufficient documentation

## 2017-04-25 DIAGNOSIS — R001 Bradycardia, unspecified: Secondary | ICD-10-CM | POA: Insufficient documentation

## 2017-04-25 DIAGNOSIS — Z951 Presence of aortocoronary bypass graft: Secondary | ICD-10-CM | POA: Diagnosis not present

## 2017-04-25 DIAGNOSIS — Z9889 Other specified postprocedural states: Secondary | ICD-10-CM | POA: Insufficient documentation

## 2017-04-25 DIAGNOSIS — Z8571 Personal history of Hodgkin lymphoma: Secondary | ICD-10-CM | POA: Insufficient documentation

## 2017-04-25 DIAGNOSIS — I2510 Atherosclerotic heart disease of native coronary artery without angina pectoris: Secondary | ICD-10-CM | POA: Diagnosis not present

## 2017-04-25 DIAGNOSIS — E785 Hyperlipidemia, unspecified: Secondary | ICD-10-CM | POA: Diagnosis not present

## 2017-04-25 DIAGNOSIS — I447 Left bundle-branch block, unspecified: Secondary | ICD-10-CM | POA: Insufficient documentation

## 2017-04-25 DIAGNOSIS — I2583 Coronary atherosclerosis due to lipid rich plaque: Secondary | ICD-10-CM

## 2017-04-25 DIAGNOSIS — I441 Atrioventricular block, second degree: Secondary | ICD-10-CM

## 2017-04-25 DIAGNOSIS — Z7984 Long term (current) use of oral hypoglycemic drugs: Secondary | ICD-10-CM | POA: Insufficient documentation

## 2017-04-25 DIAGNOSIS — Z923 Personal history of irradiation: Secondary | ICD-10-CM | POA: Insufficient documentation

## 2017-04-25 DIAGNOSIS — R918 Other nonspecific abnormal finding of lung field: Secondary | ICD-10-CM | POA: Diagnosis not present

## 2017-04-25 DIAGNOSIS — I425 Other restrictive cardiomyopathy: Secondary | ICD-10-CM | POA: Insufficient documentation

## 2017-04-25 DIAGNOSIS — Z7982 Long term (current) use of aspirin: Secondary | ICD-10-CM | POA: Insufficient documentation

## 2017-04-25 DIAGNOSIS — Z952 Presence of prosthetic heart valve: Secondary | ICD-10-CM | POA: Insufficient documentation

## 2017-04-25 DIAGNOSIS — Z954 Presence of other heart-valve replacement: Secondary | ICD-10-CM | POA: Diagnosis not present

## 2017-04-25 DIAGNOSIS — Z79899 Other long term (current) drug therapy: Secondary | ICD-10-CM | POA: Diagnosis not present

## 2017-04-25 DIAGNOSIS — E7849 Other hyperlipidemia: Secondary | ICD-10-CM

## 2017-04-25 DIAGNOSIS — E784 Other hyperlipidemia: Secondary | ICD-10-CM | POA: Diagnosis not present

## 2017-04-25 DIAGNOSIS — F329 Major depressive disorder, single episode, unspecified: Secondary | ICD-10-CM | POA: Diagnosis not present

## 2017-04-25 DIAGNOSIS — E119 Type 2 diabetes mellitus without complications: Secondary | ICD-10-CM | POA: Diagnosis not present

## 2017-04-25 DIAGNOSIS — K219 Gastro-esophageal reflux disease without esophagitis: Secondary | ICD-10-CM | POA: Diagnosis not present

## 2017-04-25 LAB — BASIC METABOLIC PANEL
Anion gap: 7 (ref 5–15)
BUN: 22 mg/dL — AB (ref 6–20)
CHLORIDE: 102 mmol/L (ref 101–111)
CO2: 30 mmol/L (ref 22–32)
Calcium: 9.4 mg/dL (ref 8.9–10.3)
Creatinine, Ser: 1.04 mg/dL (ref 0.61–1.24)
GFR calc Af Amer: >60 mL/min (ref >60)
GLUCOSE: 109 mg/dL — AB (ref 65–99)
POTASSIUM: 3.5 mmol/L (ref 3.5–5.1)
SODIUM: 139 mmol/L (ref 135–145)

## 2017-04-25 LAB — LIPID PANEL
Cholesterol: 120 mg/dL (ref 0–200)
HDL: 41 mg/dL (ref >40)
LDL Cholesterol: 50 mg/dL (ref 0–99)
TRIGLYCERIDES: 145 mg/dL (ref <150)
Total CHOL/HDL Ratio: 2.9 RATIO
VLDL: 29 mg/dL (ref 0–40)

## 2017-04-25 MED ORDER — KLOR-CON 10 10 MEQ PO TBCR: 10.0000 meq | EXTENDED_RELEASE_TABLET | Freq: Two times a day (BID) | ORAL | 3 refills | Status: AC

## 2017-04-25 MED ORDER — FUROSEMIDE 40 MG PO TABS: 80.0000 mg | ORAL_TABLET | Freq: Two times a day (BID) | ORAL | 3 refills | Status: AC

## 2017-04-25 MED ORDER — ROSUVASTATIN CALCIUM 40 MG PO TABS: 40.0000 mg | ORAL_TABLET | Freq: Every day | ORAL | 3 refills | Status: AC

## 2017-04-25 MED ORDER — LOSARTAN POTASSIUM 25 MG PO TABS: 25.0000 mg | ORAL_TABLET | Freq: Every day | ORAL | 3 refills | Status: AC

## 2017-04-25 NOTE — Patient Instructions (Signed)
Labs today  Mark Choi with your move

## 2017-04-25 NOTE — Progress Notes (Signed)
PCP: Dr. Ernie Hew Cardiology: Dr. Acie Fredrickson HF Cardiology: Dr. Aundra Dubin  51 yo with history of CAD s/p CABG, Hodgkins lymphoma treated with radiation, and mechanical aortic valve was referred by Dr. Acie Fredrickson for evaluation of progressive dyspnea.   Patient had mantle radiation for Hodgkins lymphoma at age 81.  In his 80s, he had CABG (1998).  In 2010, he had mechanical aortic valve replacement.  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    Around 5/17, he began to develop progressive dyspnea.  The dyspnea has slowly worsened since that time.  He also developed chest heaviness.  In 5/17, he had a CT chest showing bronchiectasis.  He had right and left heart cath in 7/17 that showed normal filling pressures and stable CAD (no interventional target).  PFTs in 8/17 showed a moderate restrictive defect. Dyspnea continued to progress.  When I first saw him, he was short of breath after walking 30-40 feet.  He had constant chest heaviness. He had orthopnea and had to prop up to sleep.    There has been concern for constrictive pericarditis versus restrictive cardiomyopathy given prior CABG and chest radiation.  Cath in 7/17 was not suggestive of this with completely normal filling pressures.  CPX in 4/18 actually suggested that the patient has more ventilatory than cardiac limitation, severe restrictive lung physiology.  Echo from 4/18 was reviewed, there was some pericardial thickening towards the apex and there appeared to be respirophasic variation of the interventricular septum beyond the septal bounce that would be expected with LBBB. Mitral E velocity was too low to accurately comment. RHC was repeated in 4/18. Filling pressures were elevated.  Also, there were some features concerning for constrictive pericarditis.  However, unable to cross aortic valve (mechanical) for simultaneous LV/RV measurement.  Cardiac MRI from 4/18 showed possible ventricular interdependence on the free-breathing  sequences.  High resolution CT showed diffuse ground glass in the lungs, possibly from chronic lymphatic congestion post-radiation.  There was no pulmonary fibrosis noted. There did appear to be some pericardial thickening.   Given the need to confirm constrictive pericarditis and rule out primary restrictive cardiomyopathy, I set him up for repeat RHC with trans-septal puncture to enter the LV (mechanical aortic valve).  This was done in 8/41, showing diastolic pressure equalization, dip/plateau pattern in LV and RV pressure tracings, rapid y descent in RA tracing.  There was no clear ventricular interdependence (LV and RV systolic pressures tracked together with respiration).  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    I admitted him after cath for diuresis.  We also noted on arrival for cath that he was in 2:1 AV block with HR in the 40s.  He has a baseline LBBB.  It was decided to place a pacemaker, Medtronic dual chamber PPM was placed with His bundle pacing.  His QRS complex narrowed.   With pacing and diuresis, Mr Tavenner has been feeling better.  Last CPX in 7/18 was abnormal but improved from prior.  Weight has been stable at home.  He is not short of breath walking on flat ground.  He can climb 1 flight of steps without problems but is short of breath climbing 2 flights.  Overall, he is improved.  No orthopnea/PND.  No chest pain.  No lightheadedness.  He saw Dr. Mosetta Pigeon at the [facility] heart transplant clinic.  Given improvement in his symptoms, it was decided to continue to  treat him medically.  Of note, his wife's job is moving them to the [place], and he will be leaving for [place] at the end of the month.   Labs (3/18): hgb 14, TSH normal Labs (4/18): creatinine 0.99 => 1.06, BNP 156 Labs (5/18): K 3.9, creatinine 1.1 => 1.04, BNP 74  PMH: 1. Depression 2. GERD 3. CAD: CABG in 1998 => FH CAD, also  possible accelerated atherosclerosis from prior radiation to chest.  - PCI after CABG in 2004 and 2007.  - LHC (7/17): 90% ostial LM, 95% ostial LCx, 90% pLAD, sequential LIMA-D1+mid LAD patent, SVG-OM1 patent with retrograde filling distal LCx and OM2.  RCA ok.  4. Hodgkin's lymphoma: s/p mantle radiation at age 39.  61. Type II diabetes 6. Mechanical aortic valve replacement in 2010, possible endocarditis in 2014 treated medically (abx).  7. Hyperlipidemia 8. Chronic LBBB 9. Chronic exertional dyspnea: Gradually progressive.  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  - CT chest (5/17): Bronchiectasis - RHC (7/17): mean RA 6, PA 30/11, mean PCWP 9, CI 2.58. - PFTs (8/17): moderate restriction on PFTs.  - Echo (4/18): EF 55-60%, mild LVH, grade II diastolic dysfunction, septal paradox (LBBB), mechanical aortic valve, moderately decreased RV systolic function, PASP 43 mmHg, dilated IVC.  - CPX (4/18): peak VO2 14.9 (43% predicted), VE/VCO2 55, RER 0.97 => submaximal.  However, appears to have severe restrictive lung physiology, he seems to primarily have a ventilatory limitation.  He also was noted to have chronotropic incompetence.   - Cardiac MRI (4/18): EF 64%, no LGE, normal RV size and systolic function, mechanical aortic with mild AI.  I had free breathing images added on, these were concerning for ventricular interdependence with leftwards septal shift with inspiration.  - RHC (4/18, unable to cross aortic valve due to mechanical AVR): mean RA 15 with prominent y descent noted at times but variable, RV 53/19 with dip and plateau pattern present at times but variable, PA 50/25 mean 33, mean PCWP 27 with prominent v-waves, CI 1.8.  - Echo (4/18): EF 60-65%, septal bounce from LBBB but also some respirophasic variation in septal position was noted.  RV normal.  Mitral valve E wave poorly visualized so hard to comment on respirometry across the mitral valve.  Pericardial thickening near apex noted. - High resolution CT chest (4/18): diffuse ground glass infiltrates in lungs.  ?chronic lymphatic congestion from radiation.  This does not look like pulmonary fibrosis.  He has a right-sided fibrothorax.  Finally, thickened pericardium (without calcification) was noted.   - RHC with trans-septal puncture (5/18): mean RA 15, RV 48/15, PA 40/6, mean PCWP 25, CI 2.42, LV 113/22, mean LA 21.  There was diastolic pressure equalization, dip/plateau pattern in LV and RV pressure tracings, rapid y descent in RA tracing.  There was no clear ventricular interdependence (LV and RV systolic pressures tracked together with respiration).  - CPX (7/18): RER 1.05, peak VO2 15.7 (45% predicted), VE/VCO2 slope 34, PFTs with severe restrictive pattern => moderate HF limitation.  10. 2:1 AV block with bradycardia and LBBB: Medtronic dual chamber PPM placed 5/18 with His bundle pacing.   FH: Male side with MIs, usually in their early 83s.   Social History   Social History  . Marital status: Married    Spouse name: N/A  . Number of children: 1  . Years of education: College   Occupational History  . Works at home    Social History Main Topics  .  Smoking status: Never Smoker  . Smokeless tobacco: Never Used  . Alcohol use 1.2 oz/week    2 Cans of beer per week     Comment: 4 drinks per week  . Drug use: No  . Sexual activity: Not Currently    Partners: Female     Comment: Married   Other Topics Concern  . Not on file   Social History Narrative   ** Merged History Encounter **   Lives at home w/ his wife and son   Right-handed   Drinks about 4 cups of coffee per day       ROS: All systems reviewed and negative except as per HPI.  Current Outpatient Prescriptions  Medication Sig Dispense Refill  . aspirin 81 MG tablet Take 81 mg by mouth daily.    Marland Kitchen buPROPion (WELLBUTRIN XL) 150 MG 24 hr tablet Take 150 mg by mouth daily.  1  . Canagliflozin-Metformin HCl  (INVOKAMET) 50-1000 MG TABS Take 1 tablet by mouth 2 (two) times daily before a meal.    . Cholecalciferol (VITAMIN D-3) 5000 units TABS Take 5,000 Units by mouth daily.    Marland Kitchen escitalopram (LEXAPRO) 10 MG tablet Take 10 mg by mouth daily.     . furosemide (LASIX) 40 MG tablet Take 2 tablets (80 mg total) by mouth 2 (two) times daily. 360 tablet 3  . gabapentin (NEURONTIN) 300 MG capsule Take 2 capsules (600 mg total) by mouth at bedtime. 180 capsule 3  . KLOR-CON 10 10 MEQ tablet Take 1 tablet (10 mEq total) by mouth 2 (two) times daily. 180 tablet 3  . losartan (COZAAR) 25 MG tablet Take 1 tablet (25 mg total) by mouth at bedtime. 90 tablet 3  . Multiple Vitamins-Minerals (MENS MULTI VITAMIN & MINERAL) TABS Take 1 tablet by mouth daily.    Marland Kitchen omeprazole (PRILOSEC) 20 MG capsule Take 20 mg by mouth daily.    . rosuvastatin (CRESTOR) 40 MG tablet Take 1 tablet (40 mg total) by mouth at bedtime. 90 tablet 3  . warfarin (COUMADIN) 3 MG tablet Take 1 tablet (3 mg total) by mouth as directed. Take 3 mg by mouth as directed by [facility]madin Clinic (Patient taking differently: Take 3 mg by mouth daily at 6 PM. ) 120 tablet 1  . nitroGLYCERIN (NITROSTAT) 0.4 MG SL tablet Place 1 tablet (0.4 mg total) under the tongue every 5 (five) minutes as needed for chest pain. (Patient not taking: Reported on 04/25/2017) 25 tablet 6   No current facility-administered medications for this encounter.    BP 112/77   Pulse 83   Wt 188 lb 8 oz (85.5 kg)   SpO2 99%   BMI 26.66 kg/m  General: NAD Neck: JVP 7 cm, no thyromegaly or thyroid nodule.  Lungs: CTAB CV: Nondisplaced PMI.  Heart regular mechanical S2, no S3/S4, 2/6 early SEM RUSB. No edema.  No carotid bruit.  Normal pedal pulses.  Abdomen: Soft, nontender, no hepatosplenomegaly, mild distention.  Skin: Intact without lesions or rashes.  Neurologic: Alert and oriented x 3.  Psych: Normal affect. Extremities: No clubbing or cyanosis.  HEENT: Normal.    Assessment/Plan: 1. CAD: s/p CABG.  Cath in 7/17 with stable coronaries and grafts.  Patient had already started to develop dyspnea at this time, so doubt progressive CAD as cause of symptoms.  - Continue ASA 81, statin.  Check lipids today.  2. Mechanical aortic valve: Normally-functioning on last echo in 4/18.   - Continue ASA 81 +  warfarin.  3. Chronic diastolic CHF: Suspect restrictive cardiomypoathy.  Echo in 4/18 with normal EF.  Elevated filling pressures on 5/18 RHC.  He is symptomatically improved, now NYHA class II-III.  Volume status is improved.  - Continue Lasix 80 mg po bid, BMET today.  4. Exertional dyspnea:  This has been progressive since 5/17.  As above, do not think this is related to progressive coronary disease (angiography after symptoms started showed stable disease with patent grafts).  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  CPX was suggestive of a lung problem => restrictive physiology suggests possible pulmonary fibrosis.  However, high resolution CT chest in 4/18 showed ground glass infiltrates that may be due to chronic lymphatic congestion but not pulmonary fibrosis.  Echo and cardiac MRI free breathing sequences were both concerning for some, but not marked, ventricular interdependence with septal shift noted with inspiration.  There does appear to be some pericardial thickening on echo (towards apex) and on high resolution chest CT.  RHC was done, showing elevated filling pressures and some features that could be consistent with pericardial constriction (dip and plateau RV filling pattern and prominent y descent on RA tracing).  However, similar findings could be seen with restrictive cardiomyopathy.  Unfortunately, given mechanical aortic valve, I was unable to cross into the left ventricle to get  simultaneous LV and RV tracings.  Therefore, in 5/18, I had Dr. Burt Knack repeat the cath with trans-septal puncture to enter the LV for simultaneous LV/RV tracings.  Findings on this cath were most consistent with a restrictive cardiomyopathy => diastolic pressure equalization, dip/plateau pattern in the RV/LV pressure tracings, rapid y descent RA tracing, but NO clear ventricular interdependence with respiration (LV and RV systolic pressure appeared to track together).  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  There may be a degree of constrictive pericarditis as well, but I do not think this is the major issue and I do not think that pericardial stripping would give him enough symptomatic improvement to be worth the risk. - Given my concern for restrictive cardiomyopathy and his severe symptoms, I referred him to the transplant clinic at [facility] where he was seen by Dr. Mosetta Pigeon.  If this is indeed a restrictive cardiomyopathy, working towards transplant may be our only good long-term option. Given his improvement in symptoms recently with diuresis and His bundle pacing, will plan to continue medical treatment.  Repeat CPX in 7/18 was improved from the prior study but still showed a significant limitation . 5. Bradycardia: 2:1 AV block with LBBB.  Now s/p Medtronic dual chamber PPM with His bundle pacing.  His QRS has narrowed. He will have followup with EP.   Followup will be prn as he is moving to the [place].  We discussed the importance of establishing medical care ASAP in the [place].  He will need a cardiologist and also will need to have coumadin clinic followup.   Loralie Champagne 04/25/2017

## 2017-05-03 ENCOUNTER — Ambulatory Visit (INDEPENDENT_AMBULATORY_CARE_PROVIDER_SITE_OTHER): Payer: 59 | Admitting: *Deleted

## 2017-05-03 DIAGNOSIS — Z954 Presence of other heart-valve replacement: Secondary | ICD-10-CM | POA: Diagnosis not present

## 2017-05-03 DIAGNOSIS — Z5181 Encounter for therapeutic drug level monitoring: Secondary | ICD-10-CM

## 2017-05-03 DIAGNOSIS — Z952 Presence of prosthetic heart valve: Secondary | ICD-10-CM | POA: Diagnosis not present

## 2017-05-03 LAB — POCT INR: INR: 2.7

## 2017-05-03 MED ORDER — WARFARIN SODIUM 3 MG PO TABS: ORAL_TABLET | ORAL | 0 refills | Status: AC

## 2017-05-05 ENCOUNTER — Ambulatory Visit (INDEPENDENT_AMBULATORY_CARE_PROVIDER_SITE_OTHER): Payer: Self-pay | Admitting: *Deleted

## 2017-05-05 DIAGNOSIS — I441 Atrioventricular block, second degree: Secondary | ICD-10-CM

## 2017-05-05 NOTE — Progress Notes (Signed)
Remote pacemaker transmission.   

## 2017-05-06 ENCOUNTER — Encounter: Payer: Self-pay | Admitting: Cardiology

## 2017-05-06 NOTE — Progress Notes (Unsigned)
Subject consented to enrolled on 04/20/2017 [employer] Eval. [employer] on subject phone. Teaching provided to subject along with [employer] contact number. Subject verbalized understanding

## 2017-05-19 ENCOUNTER — Ambulatory Visit (INDEPENDENT_AMBULATORY_CARE_PROVIDER_SITE_OTHER): Payer: Self-pay | Admitting: *Deleted

## 2017-05-19 DIAGNOSIS — I441 Atrioventricular block, second degree: Secondary | ICD-10-CM

## 2017-05-19 NOTE — Progress Notes (Signed)
Remote pacemaker transmission.   

## 2017-06-03 ENCOUNTER — Encounter: Payer: Self-pay | Admitting: Cardiology

## 2017-06-07 ENCOUNTER — Encounter: Payer: Self-pay | Admitting: Internal Medicine

## 2017-06-07 LAB — CUP PACEART REMOTE DEVICE CHECK
Date Time Interrogation Session: 20180828081514
Implantable Lead Location: 753859
Implantable Lead Model: 5076
Implantable Pulse Generator Implant Date: 20180524
MDC IDC LEAD IMPLANT DT: 20180524
MDC IDC LEAD IMPLANT DT: 20180524
MDC IDC LEAD LOCATION: 753860

## 2017-06-08 LAB — CUP PACEART REMOTE DEVICE CHECK
Battery Remaining Longevity: 113 mo
Implantable Lead Model: 5076
Lead Channel Impedance Value: 646 Ohm
MDC IDC LEAD IMPLANT DT: 20180524
MDC IDC LEAD IMPLANT DT: 20180524
MDC IDC LEAD LOCATION: 753859
MDC IDC LEAD LOCATION: 753860
MDC IDC MSMT LEADCHNL RA IMPEDANCE VALUE: 399 Ohm
MDC IDC PG IMPLANT DT: 20180524
MDC IDC SESS DTM: 20180829094943

## 2017-06-17 ENCOUNTER — Encounter: Payer: Self-pay | Admitting: Cardiology

## 2017-07-21 ENCOUNTER — Ambulatory Visit (INDEPENDENT_AMBULATORY_CARE_PROVIDER_SITE_OTHER): Payer: Self-pay | Admitting: *Deleted

## 2017-07-21 DIAGNOSIS — I441 Atrioventricular block, second degree: Secondary | ICD-10-CM

## 2017-07-21 NOTE — Progress Notes (Signed)
Remote pacemaker transmission.   

## 2017-07-22 ENCOUNTER — Encounter: Payer: Self-pay | Admitting: Cardiology

## 2017-07-22 LAB — CUP PACEART REMOTE DEVICE CHECK
Battery Remaining Longevity: 130 mo
Brady Statistic AP VP Percent: 0.06 %
Brady Statistic AP VS Percent: 0 %
Brady Statistic AS VS Percent: 0.83 %
Brady Statistic RA Percent Paced: 0.63 %
Brady Statistic RV Percent Paced: 99.05 %
Implantable Lead Implant Date: 20180524
Implantable Lead Implant Date: 20180524
Implantable Lead Location: 753859
Implantable Lead Location: 753860
Implantable Lead Model: 3830
Implantable Pulse Generator Implant Date: 20180524
Lead Channel Impedance Value: 285 Ohm
Lead Channel Impedance Value: 380 Ohm
Lead Channel Impedance Value: 418 Ohm
Lead Channel Impedance Value: 589 Ohm
Lead Channel Pacing Threshold Amplitude: 0.75 V
Lead Channel Pacing Threshold Pulse Width: 0.4 ms
Lead Channel Pacing Threshold Pulse Width: 0.4 ms
Lead Channel Sensing Intrinsic Amplitude: 31.625 mV
Lead Channel Sensing Intrinsic Amplitude: 31.625 mV
Lead Channel Sensing Intrinsic Amplitude: 4.25 mV
Lead Channel Setting Pacing Amplitude: 1.75 V
Lead Channel Setting Pacing Amplitude: 2.5 V
Lead Channel Setting Pacing Pulse Width: 0.4 ms
Lead Channel Setting Sensing Sensitivity: 2 mV
MDC IDC MSMT BATTERY VOLTAGE: 3.07 V
MDC IDC MSMT LEADCHNL RA PACING THRESHOLD AMPLITUDE: 0.875 V
MDC IDC MSMT LEADCHNL RA SENSING INTR AMPL: 4.25 mV
MDC IDC SESS DTM: 20181011035459
MDC IDC STAT BRADY AS VP PERCENT: 99.1 %

## 2017-07-29 ENCOUNTER — Other Ambulatory Visit: Payer: Self-pay | Admitting: Cardiovascular Disease

## 2017-07-29 ENCOUNTER — Ambulatory Visit: Payer: Self-pay | Admitting: Internal Medicine

## 2017-07-29 DIAGNOSIS — Z952 Presence of prosthetic heart valve: Secondary | ICD-10-CM

## 2017-07-29 DIAGNOSIS — Z5181 Encounter for therapeutic drug level monitoring: Secondary | ICD-10-CM

## 2017-08-18 ENCOUNTER — Ambulatory Visit: Payer: 59 | Admitting: Adult Health

## 2017-10-20 ENCOUNTER — Ambulatory Visit (INDEPENDENT_AMBULATORY_CARE_PROVIDER_SITE_OTHER): Payer: Self-pay | Admitting: *Deleted

## 2017-10-20 DIAGNOSIS — I441 Atrioventricular block, second degree: Secondary | ICD-10-CM

## 2017-10-25 NOTE — Progress Notes (Signed)
Remote pacemaker transmission.   

## 2017-10-28 ENCOUNTER — Encounter: Payer: Self-pay | Admitting: Cardiology

## 2017-11-21 LAB — CUP PACEART REMOTE DEVICE CHECK
Brady Statistic AP VP Percent: 0.02 %
Brady Statistic AP VS Percent: 0 %
Brady Statistic AS VP Percent: 97.14 %
Brady Statistic RA Percent Paced: 0.04 %
Date Time Interrogation Session: 20190110073704
Implantable Lead Location: 753859
Implantable Lead Model: 3830
Lead Channel Impedance Value: 418 Ohm
Lead Channel Pacing Threshold Pulse Width: 0.4 ms
Lead Channel Sensing Intrinsic Amplitude: 31.625 mV
Lead Channel Setting Pacing Amplitude: 1.5 V
Lead Channel Setting Pacing Pulse Width: 0.4 ms
Lead Channel Setting Sensing Sensitivity: 2 mV
MDC IDC LEAD IMPLANT DT: 20180524
MDC IDC LEAD IMPLANT DT: 20180524
MDC IDC LEAD LOCATION: 753860
MDC IDC MSMT BATTERY REMAINING LONGEVITY: 127 mo
MDC IDC MSMT BATTERY VOLTAGE: 3.04 V
MDC IDC MSMT LEADCHNL RA IMPEDANCE VALUE: 285 Ohm
MDC IDC MSMT LEADCHNL RA IMPEDANCE VALUE: 380 Ohm
MDC IDC MSMT LEADCHNL RA PACING THRESHOLD AMPLITUDE: 0.75 V
MDC IDC MSMT LEADCHNL RA SENSING INTR AMPL: 4 mV
MDC IDC MSMT LEADCHNL RA SENSING INTR AMPL: 4 mV
MDC IDC MSMT LEADCHNL RV IMPEDANCE VALUE: 589 Ohm
MDC IDC MSMT LEADCHNL RV PACING THRESHOLD AMPLITUDE: 0.875 V
MDC IDC MSMT LEADCHNL RV PACING THRESHOLD PULSEWIDTH: 0.4 ms
MDC IDC MSMT LEADCHNL RV SENSING INTR AMPL: 31.625 mV
MDC IDC PG IMPLANT DT: 20180524
MDC IDC SET LEADCHNL RV PACING AMPLITUDE: 2.5 V
MDC IDC STAT BRADY AS VS PERCENT: 2.84 %
MDC IDC STAT BRADY RV PERCENT PACED: 97.14 %

## 2018-01-19 ENCOUNTER — Ambulatory Visit (INDEPENDENT_AMBULATORY_CARE_PROVIDER_SITE_OTHER): Payer: Self-pay | Admitting: *Deleted

## 2018-01-19 DIAGNOSIS — I441 Atrioventricular block, second degree: Secondary | ICD-10-CM

## 2018-01-19 NOTE — Progress Notes (Signed)
Remote pacemaker transmission.   

## 2018-01-25 ENCOUNTER — Encounter: Payer: Self-pay | Admitting: Cardiology

## 2018-02-19 IMAGING — CT CT CHEST HIGH RESOLUTION W/O CM
3 of 5 series · 14 of 36 positions shown, 15 images · non-contrast
Comparison: Chest CT 03/10/2016.

CLINICAL DATA: 51-year-old male with history of dyspnea for 1 year.
Evaluate for pulmonary fibrosis.

EXAM:
CT CHEST WITHOUT CONTRAST
TECHNIQUE: Multidetector CT imaging of the chest was performed following the
standard protocol without intravenous contrast. High resolution
imaging of the lungs, as well as inspiratory and expiratory imaging,
was performed.

[Series 4: chest w/o 2.0 i31s 3 · axial · non-contrast · 0.78mm/px · z∈[-484,-244]mm · 8 of 150 slices shown]
[im 15/150  lung]
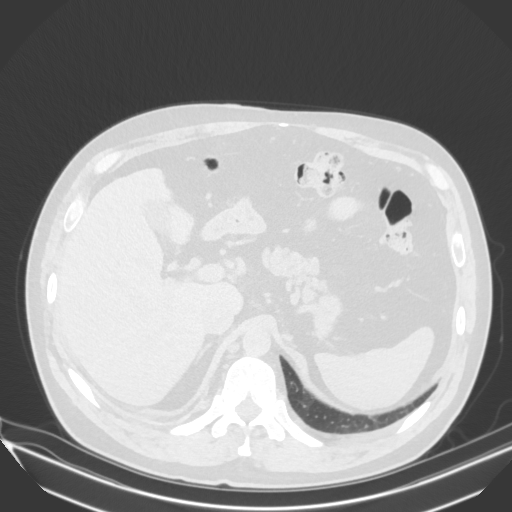
[im 29/150  lung]
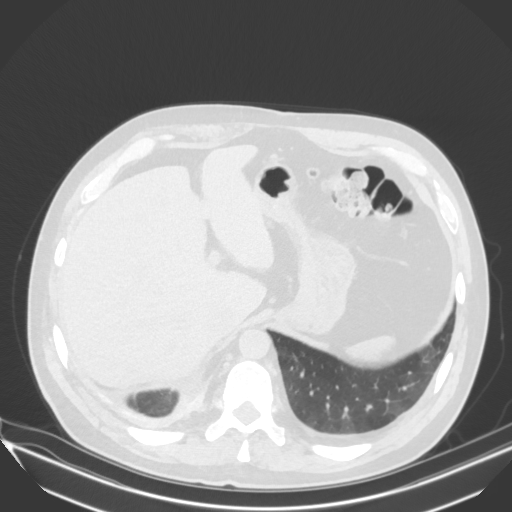
[im 50/150  lung]
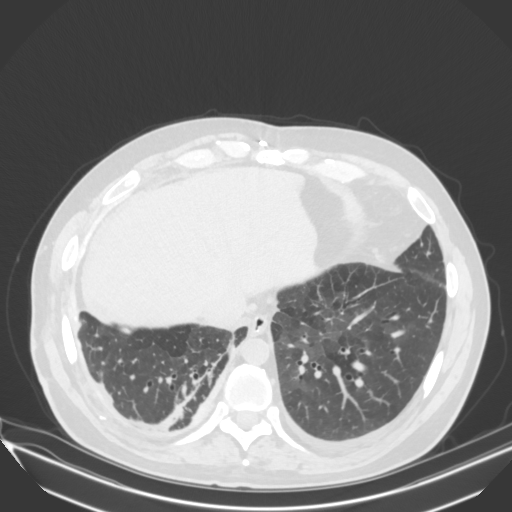
[im 64/150  lung]
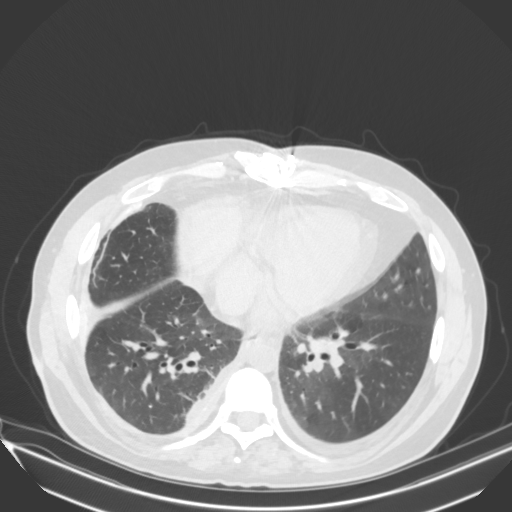
[im 86/150  lung]
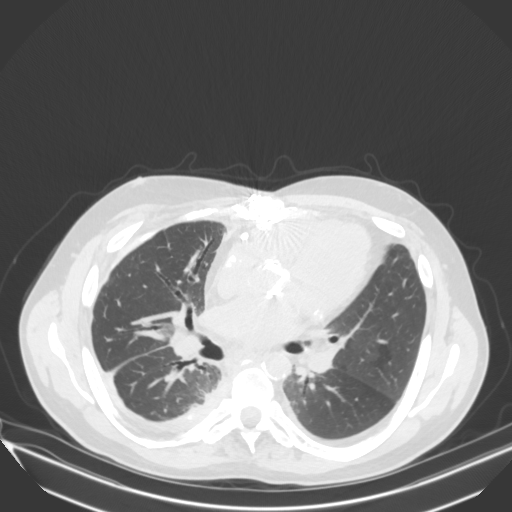
[im 100/150  lung]
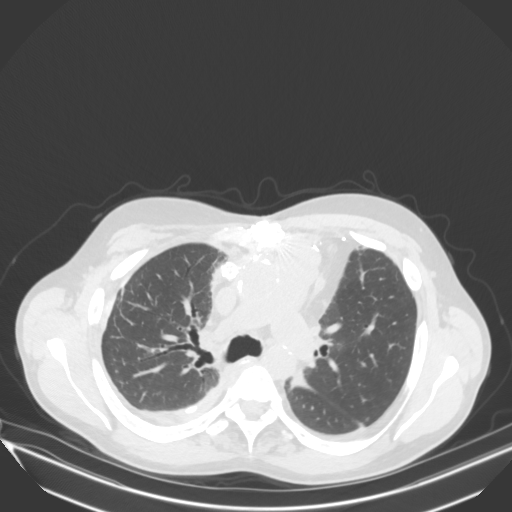
[im 121/150  lung]
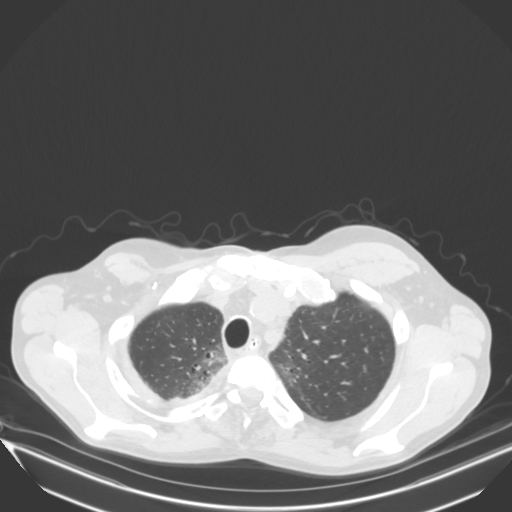
[im 135/150  lung]
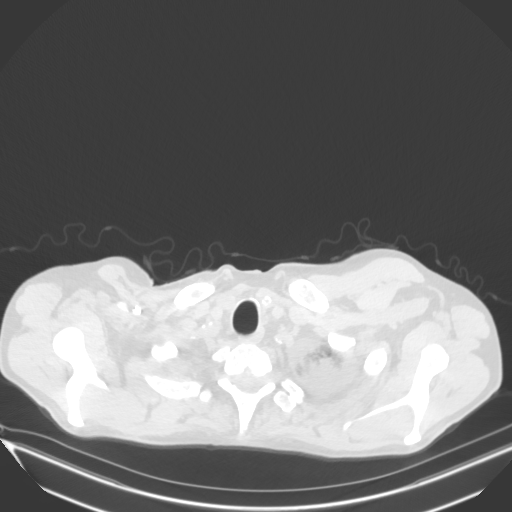

[Series 8: inspiration 1.0 b80s · axial · 0.71mm/px · z∈[-545,-214]mm · 3 of 24 slices shown, 4 images]
[im 1/24  mediastinal]
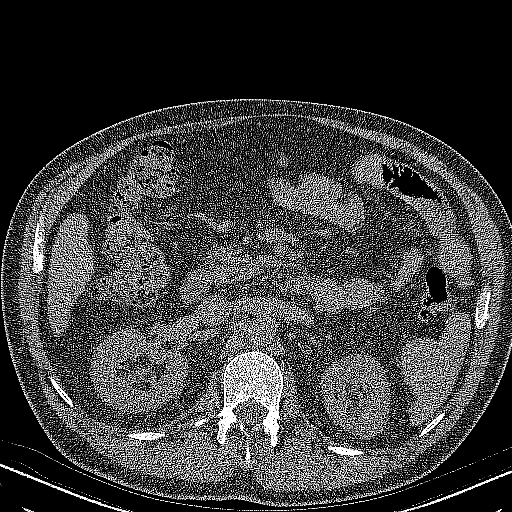
[im 1/24  lung]
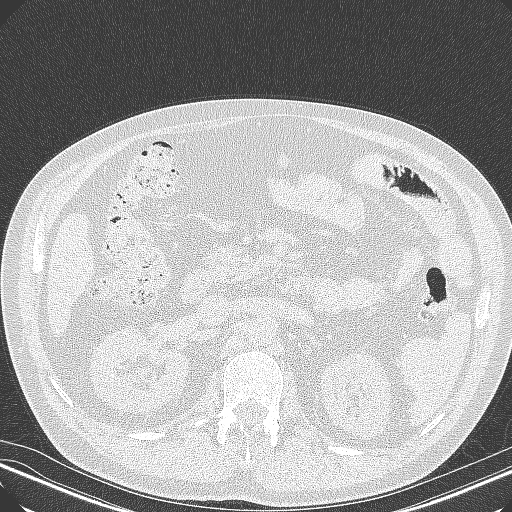
[im 12/24  lung]
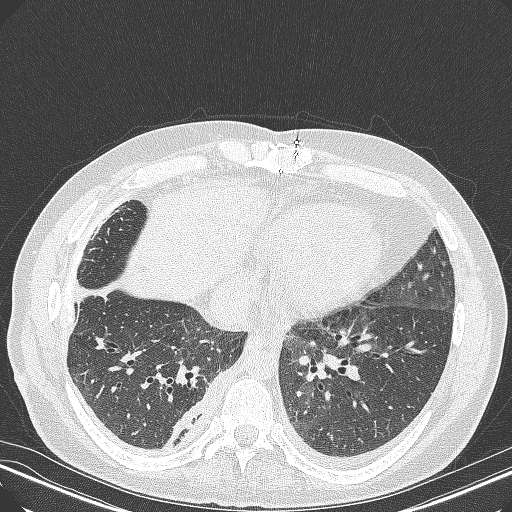
[im 24/24  lung]
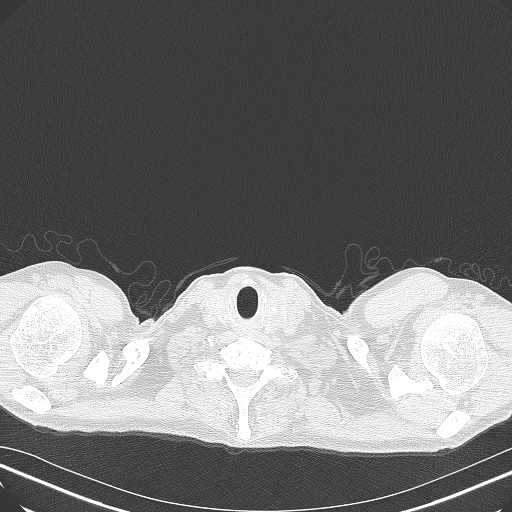

[Series 10: chest w/o 3.0 mpr cor · coronal · non-contrast · 0.59mm/px · 3 of 105 slices shown]
[im 21/105  lung]
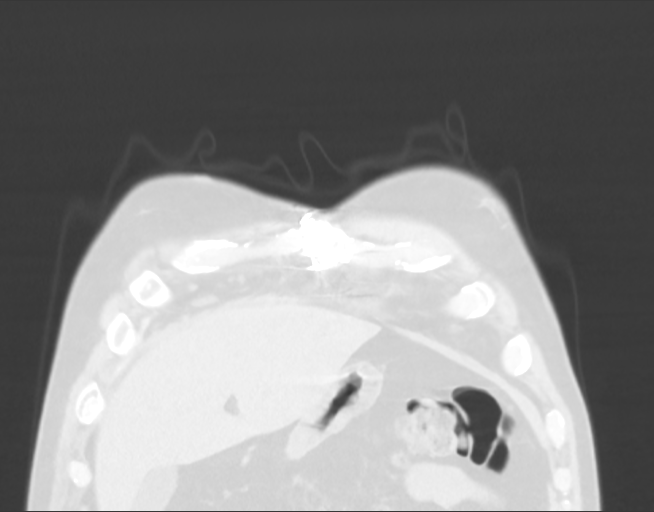
[im 42/105  lung]
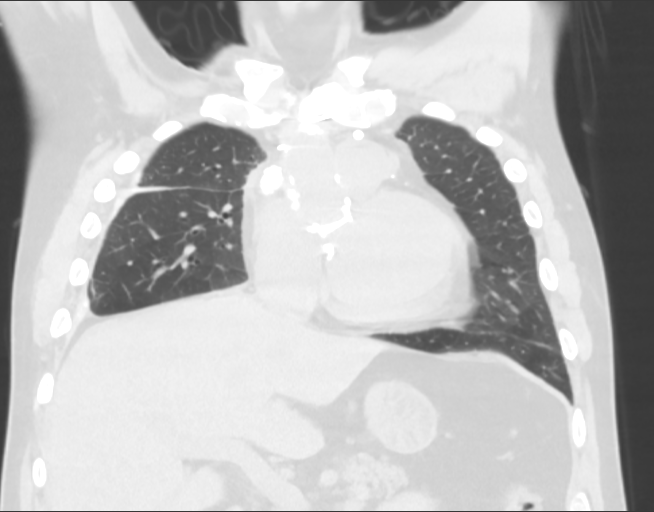
[im 63/105  lung]
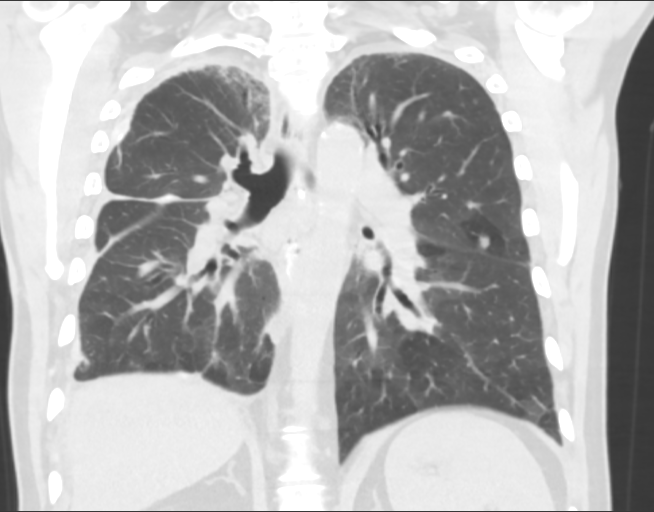

[14 of 36 positions shown; findings below may reference images not displayed]

FINDINGS: Cardiovascular: Heart size is normal. There is no significant
pericardial fluid, thickening or pericardial calcification. There is
aortic atherosclerosis, as well as atherosclerosis of the great
vessels of the mediastinum and the coronary arteries, including
calcified atherosclerotic plaque in the left main, left anterior
descending, left circumflex and right coronary arteries. Status post
median sternotomy for CABG including [REDACTED] to the LAD. Rim calcified
structure adjacent to the proximal ascending thoracic aorta
measuring 1.9 cm in diameter (axial image 55 of series 4), similar
to remote prior study from 03/10/2016, presumably an old chronic
thrombosed bypass graft aneurysm. Status post aortic valve
replacement with a mechanical aortic valve. Mitral annular
calcifications.

Mediastinum/Nodes: No pathologically enlarged mediastinal or hilar
lymph nodes. Please note that accurate exclusion of hilar adenopathy
is limited on noncontrast CT scans. Esophagus is unremarkable in
appearance. No axillary lymphadenopathy.

Lungs/Pleura: Extensive pleural thickening calcification in the
right hemithorax, similar to prior study from 0610, with evidence of
chronic volume loss throughout the periphery of the right lung,
indicative of a fibrothorax. Trace left pleural effusion. No left
pleural calcifications are noted. High-resolution images demonstrate
relatively diffuse ground-glass attenuation throughout the lungs
bilaterally which appears relatively similar to the prior
examination. In the medial aspects of both lungs there are some
focal areas of septal thickening and architectural distortion,
presumably related to postradiation fibrosis in this patient with
history of Hodgkin's disease. No significant traction bronchiectasis
or frank honeycombing. Inspiratory and expiratory imaging
demonstrates mild to moderate air trapping, indicative of small
airways disease. 8 x 7 mm pulmonary nodule in the posterior aspect
of the left upper lobe (image 71 of series 5) is unchanged,
presumably benign. No other suspicious appearing pulmonary nodules
or masses are noted.

Upper Abdomen: Multiple tiny partially calcified gallstones are
noted within the dependent portion of the gallbladder. No findings
to suggest an acute cholecystitis at this time.

Musculoskeletal: There are no aggressive appearing lytic or blastic
lesions noted in the visualized portions of the skeleton.
IMPRESSION: 1. Unusual appearance of the lungs with relatively diffuse
ground-glass attenuation. This pattern can be seen in the setting of
a smoking related disease such as desquamative interstitial
pneumonia (DIP), however, in this non smoking patient with history
of prior radiation therapy for Hodgkin's disease, these findings are
favored to reflect chronic pulmonary edema likely related to chronic
lymphatic congestion from prior radiation therapy. Overall, the
appearance is very similar to remote prior study 03/10/2016.
2. Right-sided fibrothorax again noted.
3. 8 x 7 mm left upper lobe pulmonary nodule is unchanged compared
to 0610, presumably benign.
4. Aortic atherosclerosis, in addition to left main and 3 vessel
coronary artery disease. Status post median sternotomy for CABG
including [REDACTED] to the LAD, as well as mechanical aortic valve
replacement.
5. Cholelithiasis without evidence of acute cholecystitis at this
time.

## 2018-02-27 LAB — CUP PACEART REMOTE DEVICE CHECK
Brady Statistic AP VP Percent: 0.02 %
Brady Statistic AP VS Percent: 0 %
Brady Statistic AS VP Percent: 96.14 %
Date Time Interrogation Session: 20190411011739
Implantable Lead Model: 3830
Implantable Lead Model: 5076
Implantable Pulse Generator Implant Date: 20180524
Lead Channel Impedance Value: 285 Ohm
Lead Channel Impedance Value: 361 Ohm
Lead Channel Impedance Value: 418 Ohm
Lead Channel Impedance Value: 589 Ohm
Lead Channel Pacing Threshold Amplitude: 0.875 V
Lead Channel Pacing Threshold Pulse Width: 0.4 ms
Lead Channel Sensing Intrinsic Amplitude: 31.625 mV
Lead Channel Sensing Intrinsic Amplitude: 31.625 mV
Lead Channel Sensing Intrinsic Amplitude: 4.125 mV
Lead Channel Setting Pacing Amplitude: 2.5 V
Lead Channel Setting Pacing Pulse Width: 0.4 ms
MDC IDC LEAD IMPLANT DT: 20180524
MDC IDC LEAD IMPLANT DT: 20180524
MDC IDC LEAD LOCATION: 753859
MDC IDC LEAD LOCATION: 753860
MDC IDC MSMT BATTERY REMAINING LONGEVITY: 124 mo
MDC IDC MSMT BATTERY VOLTAGE: 3.02 V
MDC IDC MSMT LEADCHNL RA PACING THRESHOLD AMPLITUDE: 0.75 V
MDC IDC MSMT LEADCHNL RA PACING THRESHOLD PULSEWIDTH: 0.4 ms
MDC IDC MSMT LEADCHNL RA SENSING INTR AMPL: 4.125 mV
MDC IDC SET LEADCHNL RA PACING AMPLITUDE: 1.5 V
MDC IDC SET LEADCHNL RV SENSING SENSITIVITY: 2 mV
MDC IDC STAT BRADY AS VS PERCENT: 3.84 %
MDC IDC STAT BRADY RA PERCENT PACED: 0.08 %
MDC IDC STAT BRADY RV PERCENT PACED: 96.05 %

## 2018-04-20 ENCOUNTER — Ambulatory Visit (INDEPENDENT_AMBULATORY_CARE_PROVIDER_SITE_OTHER): Payer: Self-pay | Admitting: *Deleted

## 2018-04-20 DIAGNOSIS — R001 Bradycardia, unspecified: Secondary | ICD-10-CM

## 2018-04-20 DIAGNOSIS — I441 Atrioventricular block, second degree: Secondary | ICD-10-CM

## 2018-04-20 NOTE — Progress Notes (Signed)
Remote pacemaker transmission.   

## 2018-05-21 LAB — CUP PACEART REMOTE DEVICE CHECK
Brady Statistic AP VP Percent: 0.01 %
Brady Statistic AP VS Percent: 0 %
Brady Statistic AS VP Percent: 95.88 %
Brady Statistic AS VS Percent: 4.1 %
Brady Statistic RV Percent Paced: 95.89 %
Implantable Lead Implant Date: 20180524
Implantable Lead Implant Date: 20180524
Implantable Lead Location: 753860
Implantable Lead Model: 5076
Implantable Pulse Generator Implant Date: 20180524
Lead Channel Impedance Value: 266 Ohm
Lead Channel Impedance Value: 342 Ohm
Lead Channel Impedance Value: 399 Ohm
Lead Channel Impedance Value: 570 Ohm
Lead Channel Pacing Threshold Amplitude: 0.875 V
Lead Channel Sensing Intrinsic Amplitude: 31.625 mV
Lead Channel Setting Pacing Amplitude: 1.5 V
Lead Channel Setting Pacing Amplitude: 2.5 V
Lead Channel Setting Sensing Sensitivity: 2 mV
MDC IDC LEAD LOCATION: 753859
MDC IDC MSMT BATTERY REMAINING LONGEVITY: 120 mo
MDC IDC MSMT BATTERY VOLTAGE: 3.01 V
MDC IDC MSMT LEADCHNL RA PACING THRESHOLD AMPLITUDE: 0.75 V
MDC IDC MSMT LEADCHNL RA PACING THRESHOLD PULSEWIDTH: 0.4 ms
MDC IDC MSMT LEADCHNL RA SENSING INTR AMPL: 3.875 mV
MDC IDC MSMT LEADCHNL RA SENSING INTR AMPL: 3.875 mV
MDC IDC MSMT LEADCHNL RV PACING THRESHOLD PULSEWIDTH: 0.4 ms
MDC IDC MSMT LEADCHNL RV SENSING INTR AMPL: 31.625 mV
MDC IDC SESS DTM: 20190711091338
MDC IDC SET LEADCHNL RV PACING PULSEWIDTH: 0.4 ms
MDC IDC STAT BRADY RA PERCENT PACED: 0.02 %

## 2018-08-08 ENCOUNTER — Telehealth: Payer: Self-pay

## 2018-08-08 NOTE — Telephone Encounter (Addendum)
Called patient to complete annual [employer]
